# Patient Record
Sex: Female | Born: 1935 | Race: White | Hispanic: No | Marital: Married | Smoking: Never smoker
Health system: Southern US, Community
[De-identification: ages and names within clinical notes are randomized; demographics above are authoritative.]

## PROBLEM LIST (undated history)

## (undated) DIAGNOSIS — I10 Essential (primary) hypertension: Secondary | ICD-10-CM

## (undated) DIAGNOSIS — I499 Cardiac arrhythmia, unspecified: Secondary | ICD-10-CM

## (undated) DIAGNOSIS — I509 Heart failure, unspecified: Secondary | ICD-10-CM

## (undated) DIAGNOSIS — E119 Type 2 diabetes mellitus without complications: Secondary | ICD-10-CM

## (undated) DIAGNOSIS — I272 Pulmonary hypertension, unspecified: Secondary | ICD-10-CM

## (undated) DIAGNOSIS — C50919 Malignant neoplasm of unspecified site of unspecified female breast: Secondary | ICD-10-CM

## (undated) DIAGNOSIS — N189 Chronic kidney disease, unspecified: Secondary | ICD-10-CM

## (undated) DIAGNOSIS — E785 Hyperlipidemia, unspecified: Secondary | ICD-10-CM

## (undated) DIAGNOSIS — R000 Tachycardia, unspecified: Secondary | ICD-10-CM

## (undated) HISTORY — DX: Pulmonary hypertension, unspecified: I27.20

## (undated) HISTORY — DX: Cardiac arrhythmia, unspecified: I49.9

## (undated) HISTORY — DX: Heart failure, unspecified: I50.9

## (undated) HISTORY — DX: Hyperlipidemia, unspecified: E78.5

## (undated) HISTORY — DX: Type 2 diabetes mellitus without complications: E11.9

---

## 1992-05-01 DIAGNOSIS — C50919 Malignant neoplasm of unspecified site of unspecified female breast: Secondary | ICD-10-CM

## 1992-05-01 HISTORY — PX: MASTECTOMY: SHX3

## 1992-05-01 HISTORY — DX: Malignant neoplasm of unspecified site of unspecified female breast: C50.919

## 2005-04-13 ENCOUNTER — Ambulatory Visit: Payer: Self-pay | Admitting: Family Medicine

## 2006-05-21 ENCOUNTER — Ambulatory Visit: Payer: Self-pay | Admitting: Family Medicine

## 2006-09-17 ENCOUNTER — Ambulatory Visit: Payer: Self-pay | Admitting: Specialist

## 2006-09-17 ENCOUNTER — Other Ambulatory Visit: Payer: Self-pay

## 2006-10-03 ENCOUNTER — Inpatient Hospital Stay: Payer: Self-pay | Admitting: Specialist

## 2007-01-09 ENCOUNTER — Ambulatory Visit: Payer: Self-pay | Admitting: Family Medicine

## 2007-01-19 ENCOUNTER — Ambulatory Visit: Payer: Self-pay | Admitting: Family Medicine

## 2007-02-21 ENCOUNTER — Ambulatory Visit: Payer: Self-pay | Admitting: Specialist

## 2007-03-06 ENCOUNTER — Inpatient Hospital Stay: Payer: Self-pay | Admitting: Specialist

## 2007-07-30 ENCOUNTER — Ambulatory Visit: Payer: Self-pay | Admitting: Family Medicine

## 2007-12-11 IMAGING — CR DG KNEE 1-2V*L*
1 series · 2 of 2 positions shown · non-contrast
Comparison: none

REASON FOR EXAM: Post-op
COMMENTS:   Bedside (portable):Y

PROCEDURE:     DXR - DXR KNEE LEFT AP AND LATERAL  - March 06, 2007 [DATE]
RESULT:     Images of the LEFT knee are obtained following LEFT knee
arthroplasty. Surgical drains and skin staples are present. There is no
evidence of an immediate post-operative bone or hardware complication.

[Series 1: view not recorded · 0.17mm/px · 2 of 2 slices shown]
[im 1/2]
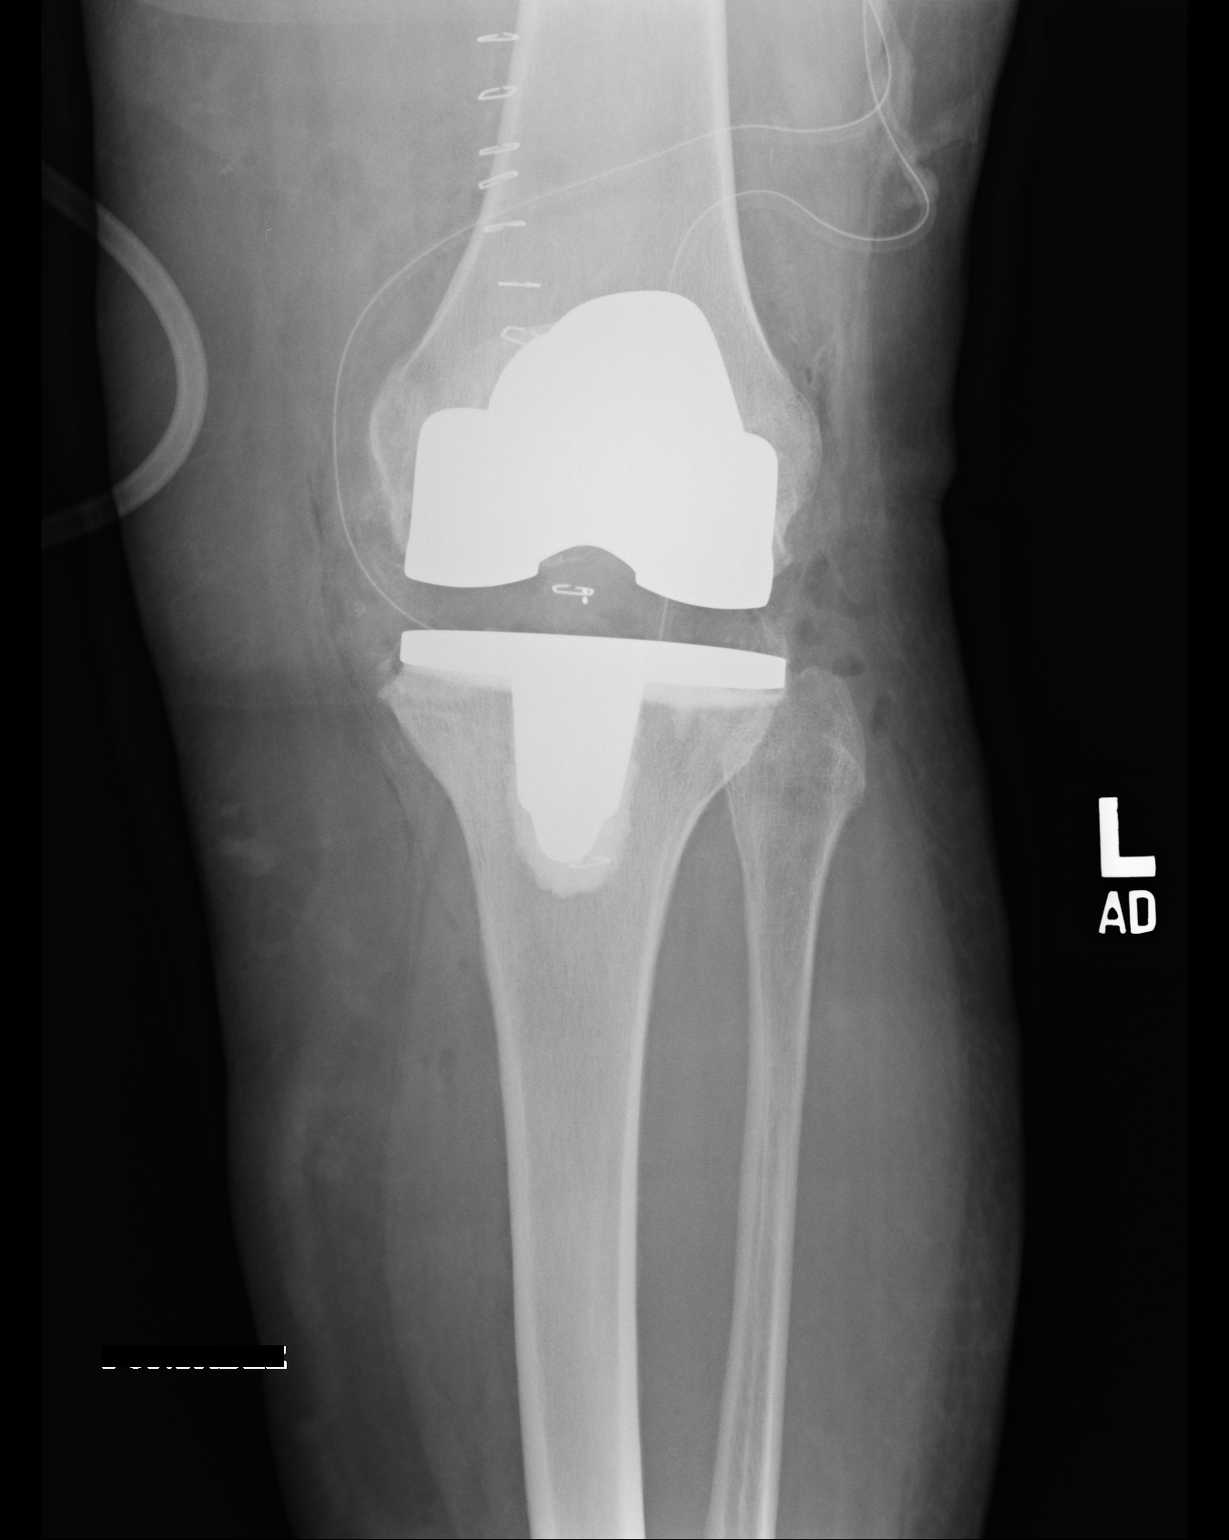
[im 2/2]
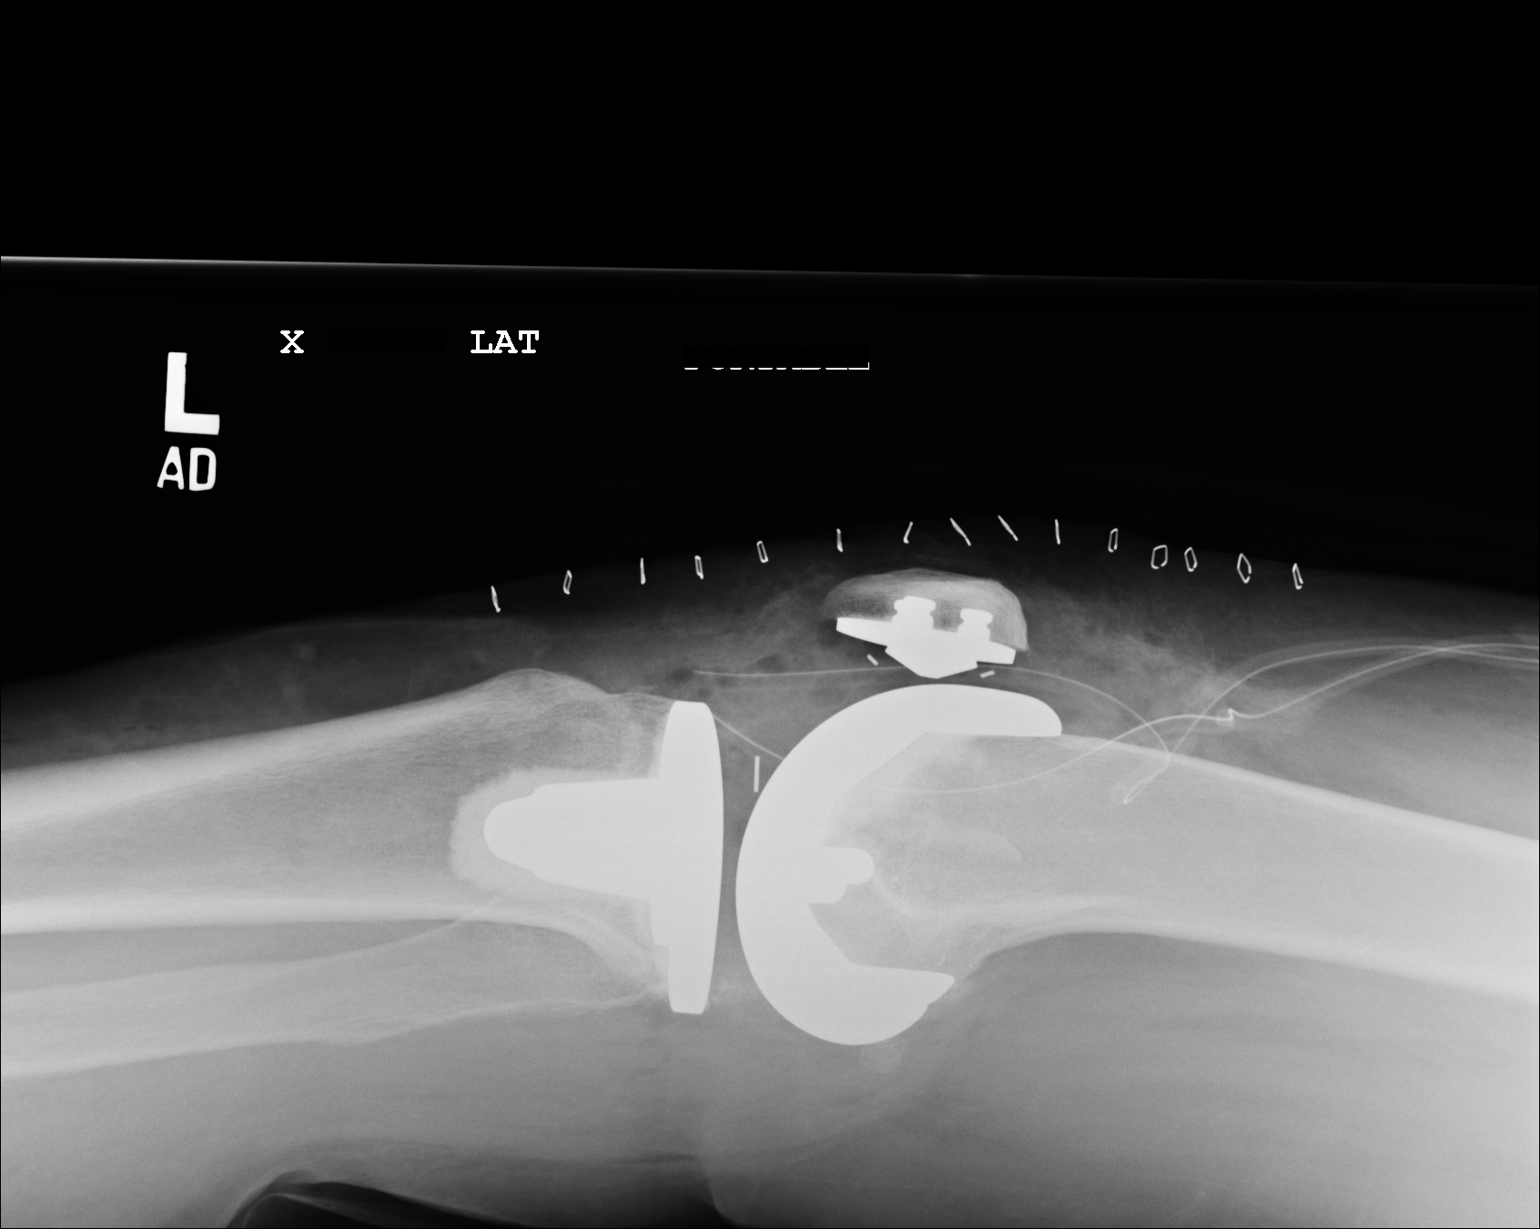

[2 of 2 positions shown; findings below may reference images not displayed]

IMPRESSION: Please see above.

## 2008-08-06 ENCOUNTER — Ambulatory Visit: Payer: Self-pay | Admitting: Family Medicine

## 2009-08-26 ENCOUNTER — Ambulatory Visit: Payer: Self-pay | Admitting: Family Medicine

## 2010-09-14 ENCOUNTER — Ambulatory Visit: Payer: Self-pay | Admitting: Family Medicine

## 2011-10-25 ENCOUNTER — Ambulatory Visit: Payer: Self-pay | Admitting: Family Medicine

## 2012-01-06 ENCOUNTER — Ambulatory Visit: Payer: Self-pay

## 2012-06-02 ENCOUNTER — Emergency Department: Payer: Self-pay | Admitting: Emergency Medicine

## 2012-06-02 LAB — CBC
HCT: 37.7 % (ref 35.0–47.0)
HGB: 12.3 g/dL (ref 12.0–16.0)
MCH: 28.5 pg (ref 26.0–34.0)
RDW: 15 % — ABNORMAL HIGH (ref 11.5–14.5)
WBC: 6.6 10*3/uL (ref 3.6–11.0)

## 2012-06-02 LAB — COMPREHENSIVE METABOLIC PANEL
Albumin: 3.9 g/dL (ref 3.4–5.0)
Anion Gap: 9 (ref 7–16)
BUN: 24 mg/dL — ABNORMAL HIGH (ref 7–18)
Calcium, Total: 9.2 mg/dL (ref 8.5–10.1)
Co2: 27 mmol/L (ref 21–32)
EGFR (Non-African Amer.): 40 — ABNORMAL LOW
Glucose: 92 mg/dL (ref 65–99)
Sodium: 143 mmol/L (ref 136–145)
Total Protein: 7.4 g/dL (ref 6.4–8.2)

## 2012-06-02 LAB — CK TOTAL AND CKMB (NOT AT ARMC): CK-MB: 0.7 ng/mL (ref 0.5–3.6)

## 2013-02-27 ENCOUNTER — Ambulatory Visit: Payer: Self-pay | Admitting: Family Medicine

## 2013-05-02 ENCOUNTER — Ambulatory Visit: Payer: Self-pay | Admitting: Family Medicine

## 2014-02-06 IMAGING — US US EXTREM LOW VENOUS*L*
1 series · 14 of 24 positions shown · non-contrast
Comparison: None.

CLINICAL DATA: Left leg pain, swelling.

EXAM:
LEFT LOWER EXTREMITY VENOUS DOPPLER ULTRASOUND
TECHNIQUE: Gray-scale sonography with graded compression, as well as color
Doppler and duplex ultrasound, were performed to evaluate the deep
venous system from the level of the common femoral vein through the
popliteal and proximal calf veins. Spectral Doppler was utilized to
evaluate flow at rest and with distal augmentation maneuvers.

[Series 1: us extrem low venous*left* · 0.10mm/px · 14 of 32 slices shown]
[im 1/32]
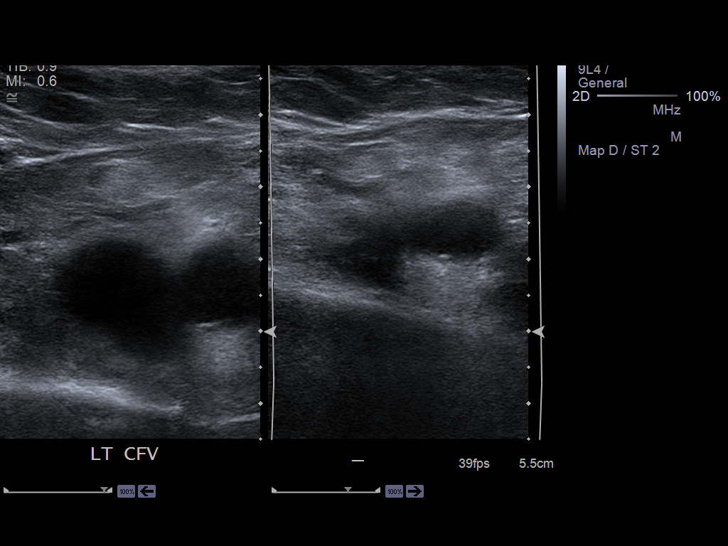
[im 3/32]
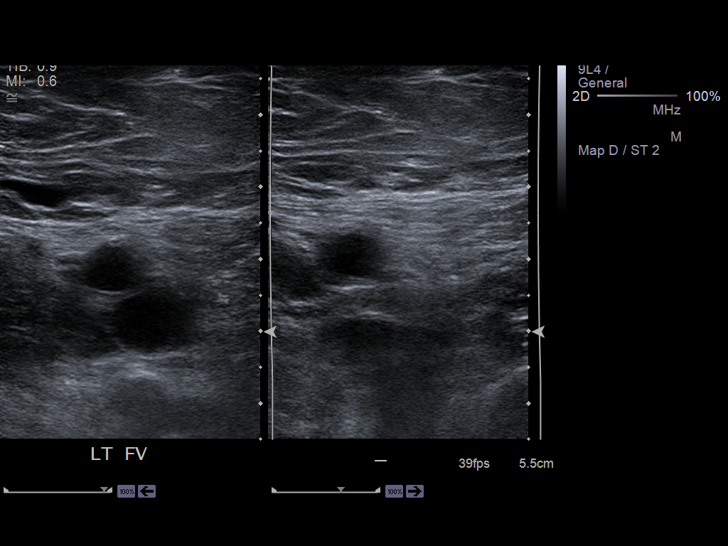
[im 6/32]
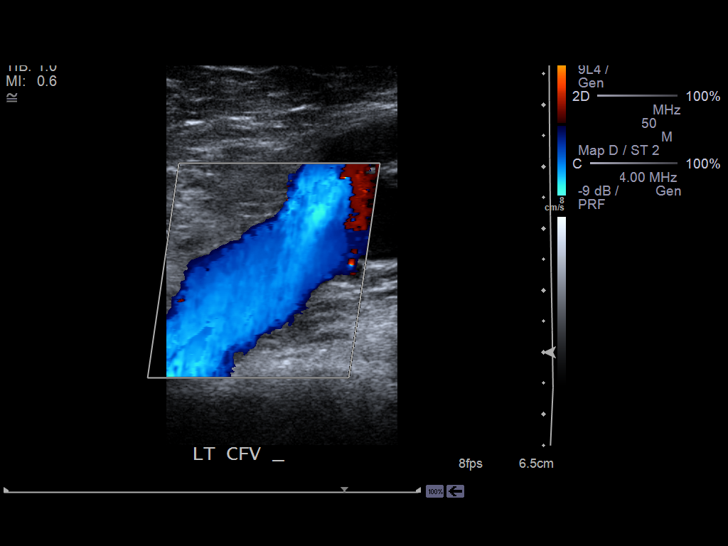
[im 9/32]
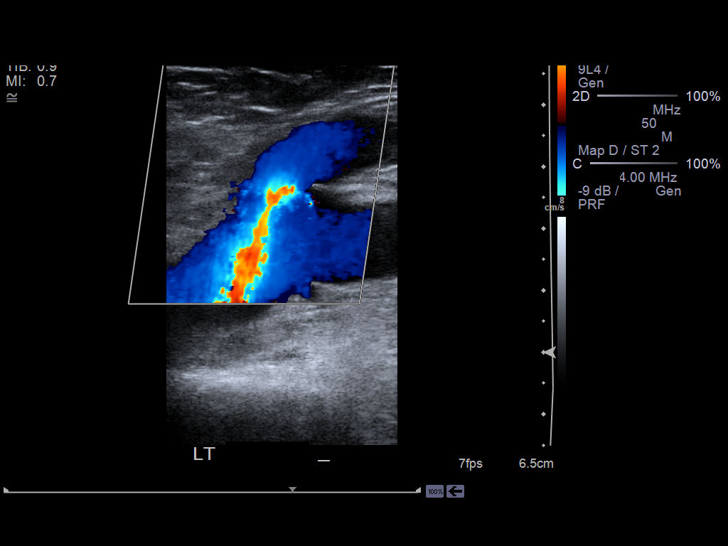
[im 10/32]
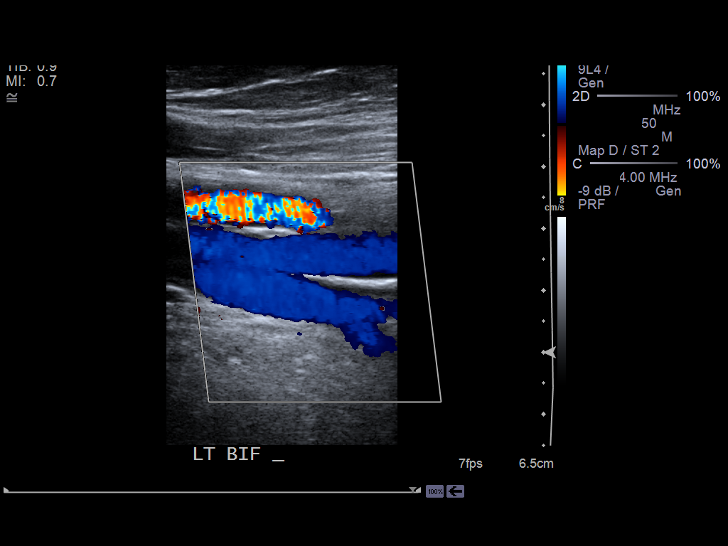
[im 13/32]
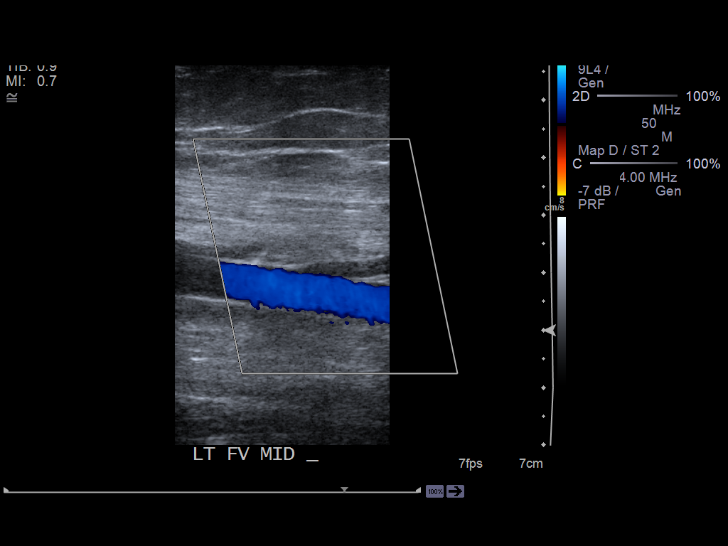
[im 15/32]
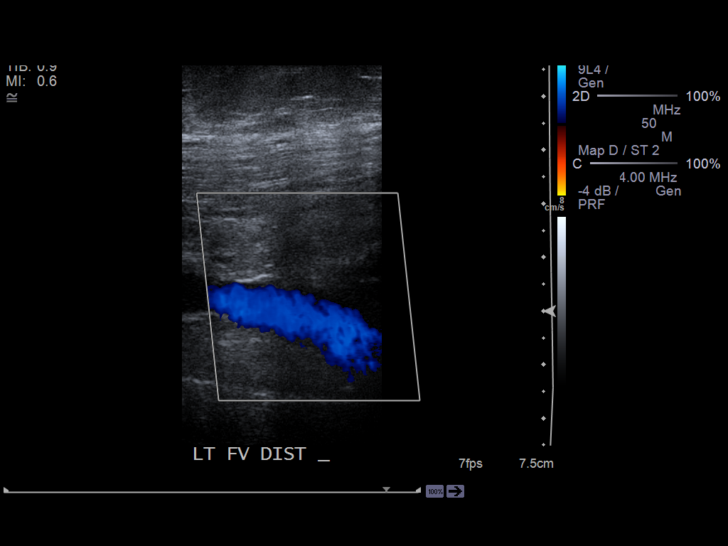
[im 17/32]
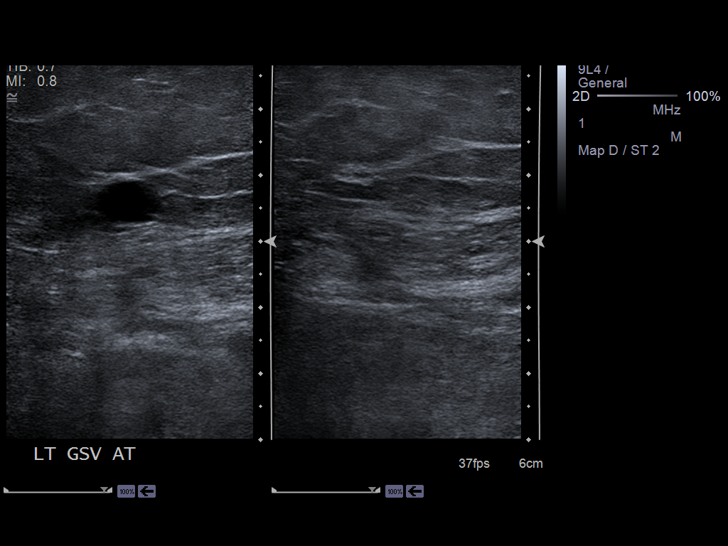
[im 19/32]
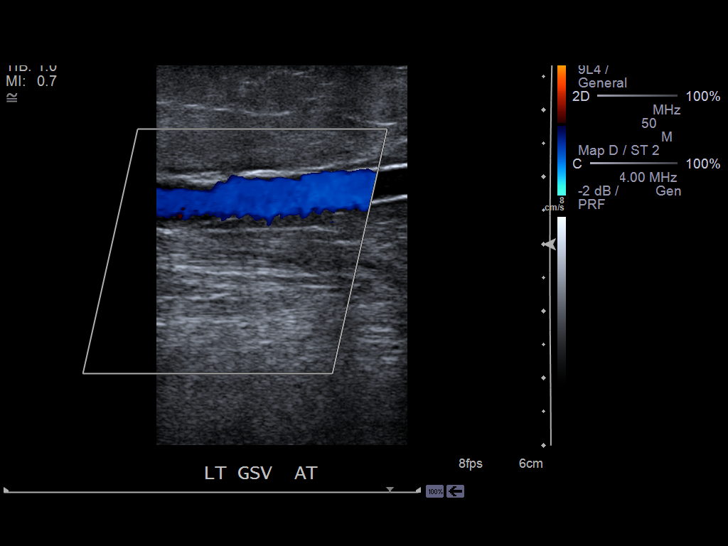
[im 22/32]
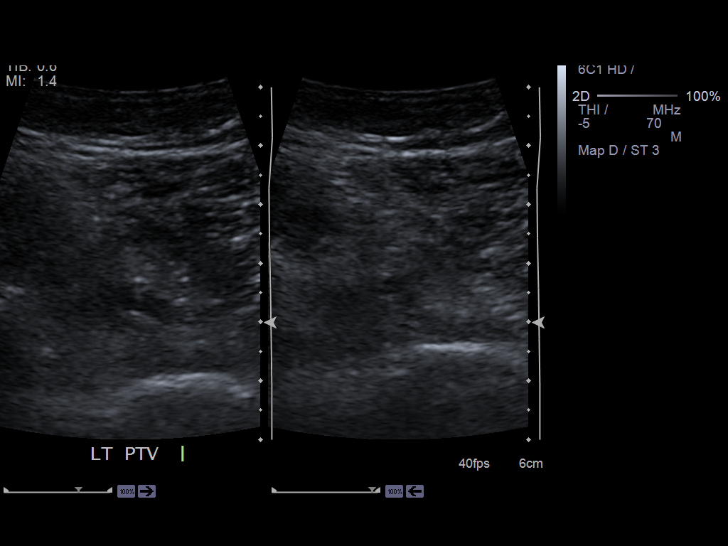
[im 25/32]
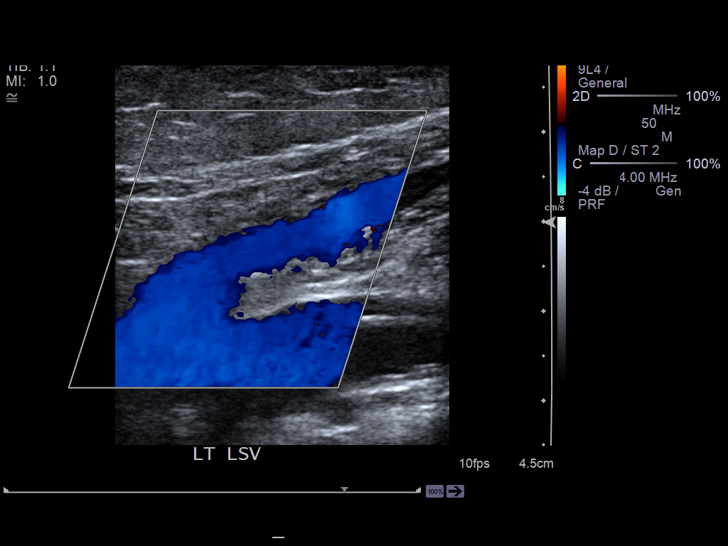
[im 26/32]
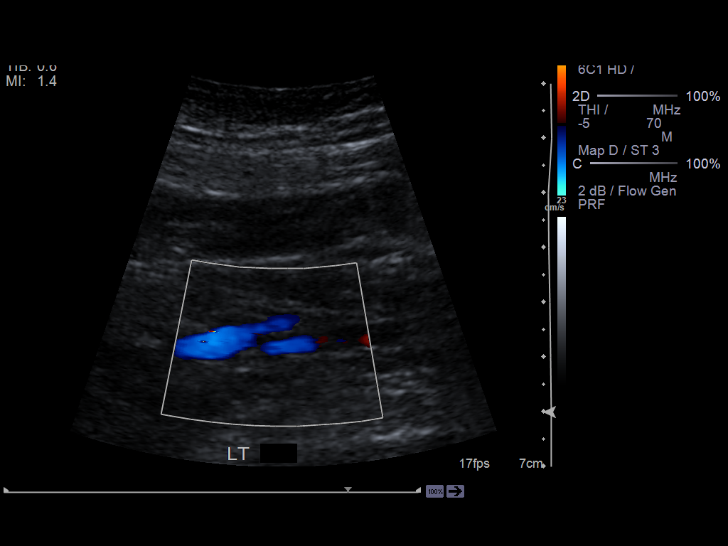
[im 29/32]
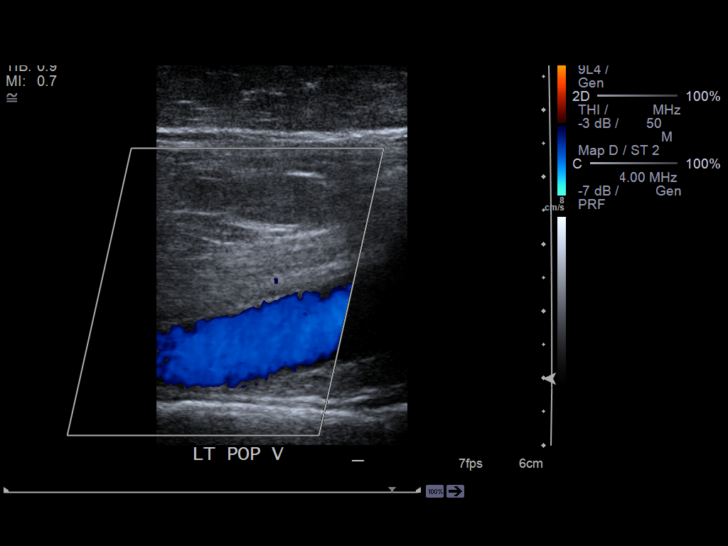
[im 32/32]
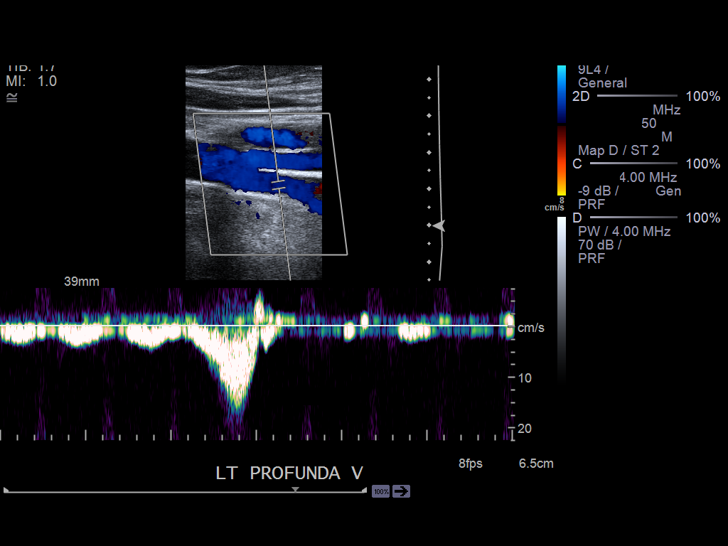

[14 of 24 positions shown; findings below may reference images not displayed]

FINDINGS: Thrombus within deep veins:  None visualized.

Compressibility of deep veins:  Normal.

Duplex waveform respiratory phasicity:  Normal.

Duplex waveform response to augmentation:  Normal.

Venous reflux:  None visualized.

Other findings:  None visualized.
IMPRESSION: No evidence of left lower extremity DVT.

## 2014-03-14 ENCOUNTER — Emergency Department: Payer: Self-pay | Admitting: Emergency Medicine

## 2014-07-12 ENCOUNTER — Observation Stay: Payer: Self-pay | Admitting: Internal Medicine

## 2014-08-30 NOTE — H&P (Signed)
PATIENT NAME:  Destiny Oliver, Destiny Oliver MR#:  [id number] DATE OF BIRTH:  1926/08/01  DATE OF ADMISSION:  07/12/2014  PRIMARY CARE PHYSICIAN: Youlanda Roys. Lovie Macadamia, MD    PRESENTING COMPLAINT: "I passed out today."   HISTORY OF PRESENT ILLNESS: Destiny Oliver is a very pleasant 81 year old Caucasian female with past medical history of hyperlipidemia, hypertension, difficulty hearing, comes to the Emergency Room after she had a syncopal episode in her garage while she was trying to get into her car to go have lunch out with her husband. She passed out. She had an injury around her left forehead. She did not have any bleeding. Woke up and called her husband who brought her to the Emergency Room. In the Emergency Room, she was otherwise hemodynamically stable; however, her blood pressure remained on the higher side, which was systolic in the A999333. She received some IV hydralazine. Blood pressure came down to 152. The patient thereby is being admitted for overnight observation. She denies shortness of breath, chest pain, any dizziness, or nausea or vomiting.   PAST MEDICAL HISTORY:  1.  Knee surgery.  2.  Hyperlipidemia.  3.  Impaired hearing.  4.  Chronic anemia.  5.  Hypertension.  6.  Type 2 diabetes.  7.  History of breast cancer.  8.  CAD.  9.  Left and right total knee replacement.  10.  Thyroid nodule removal.  11.  Surgery for detached retina in her right eye.  12.  Bilateral cataract extraction.  13.  Total hysterectomy.  14.  Appendectomy.  15.  Right mastectomy.  16.  Partial hysterectomy.   ALLERGIES: CALCIUM CHANNEL BLOCKER, CEPHALOSPORINS, AND PENICILLIN.    MEDICATIONS:  1.  Verapamil 240 mg p.o. daily.  2.  Pravastatin 40 mg p.o. every other day.  3.  Metoprolol 200 mg 1/2 tablet b.i.d.  4.  Hydrochlorothiazide/lisinopril 12.5/20 mg 1 tablet b.i.d.  5.  Hydralazine 50 mg p.o. t.i.d.  6.  Aspirin 81 mg daily.   REVIEW OF SYSTEMS:  CONSTITUTIONAL: No fever. Positive for some fatigue and  weakness.  EYES: No blurred or double vision.  ENT: No tinnitus or ear pain. Positive impaired hearing.  RESPIRATORY: No cough, wheeze, hemoptysis, or dyspnea.  CARDIOVASCULAR: No chest pain, orthopnea, or edema. Positive for hypertension.  GASTROINTESTINAL: No nausea, vomiting, diarrhea, or abdominal pain.  GENITOURINARY: No dysuria, hematuria, or frequency.  ENDOCRINE: No polyuria, nocturia, or thyroid problems.  HEMATOLOGY: Positive for chronic anemia. No easy bruising or bleeding.  SKIN: No acne, rash, or lesions.  MUSCULOSKELETAL: No arthritis, swelling, or gout.  NEUROLOGIC: No CVA, TIA, dementia, or headache.  PSYCHIATRIC: No anxiety, depression, or bipolar disorder.   All other systems reviewed are negative.   PHYSICAL EXAMINATION:  GENERAL: The patient is awake, alert, oriented x 3, not in acute distress.  VITAL SIGNS: She is afebrile, pulse is 72, blood pressure is 152/71, saturations are 94% on room air.  HEENT: Atraumatic, normocephalic. Pupils: PERRLA. EOM intact. Oral mucosa is moist.  NECK: Supple. No JVD. No carotid bruit.  RESPIRATORY: Clear to auscultation bilaterally. No rales, rhonchi, respiratory distress, or labored breathing.  CARDIOVASCULAR: Both heart sounds are normal. Rate, rhythm regular. PMI not lateralized. Chest is nontender.  EXTREMITIES: Good pedal pulses, good femoral pulses. No lower extremity edema.  ABDOMEN: Obese, soft, nontender. No organomegaly. Positive bowel sounds.  NEUROLOGIC: Grossly intact cranial nerves II through XII. No motor or sensory deficit.  PSYCHIATRIC: The patient is awake, alert, oriented x 3.  SKIN: Warm  and dry.   Chest x-ray shows airspace opacity in left lower lobe could reflect atelectasis. Mild airway  thickening suggestive of bronchitis.   LABORATORY DATA: CBC within normal limits. The glucose is 110, BUN 31, creatinine 1.3. The rest of the electrolytes normal. UA negative for UTI.   CT of the head shows minimal soft  tissue stranding about the left side of the forehead without associated radiopaque foreign body, displaced calvarial fracture or acute intracranial process.  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   EKG shows sinus rhythm with occasional PVCs.   ASSESSMENT AND PLAN: An 81 year old, Destiny Oliver, with history of hypertension, type 2 diabetes, impaired hearing, comes in with:  1.  Syncopal episode, etiology unclear, but could be due to elevated blood pressure. The patient's blood pressure since being in the Emergency Room has been higher, around [id number] to A999333 systolic. She received intravenous hydralazine 10 mg in the Emergency Room. We will resume her blood pressure medications and monitor blood pressure closely. She will receive some intravenous fluids. Her EKG shows sinus rhythm, but we will observe her overnight on telemetry monitor and cycle cardiac enzymes x 3. The patient is asymptomatic at this time.  2.  Type 2 diabetes. The patient will continue on sliding scale. She is not on any medications at home.  3.  Uncontrolled hypertension. We will resume her hydrochlorothiazide/lisinopril, metoprolol, verapamil, and hydralazine. 4.  Hyperlipidemia. The patient is on pravastatin.  5.  Chronic anemia. Hemoglobin stable.  6.  Deep vein thrombosis prophylaxis. Subcutaneous heparin t.i.d.   Above was discussed with the patient and the patient's husband who was present in the Emergency Room.  TIME SPENT: 50 minutes.    ____________________________ Hart Rochester Posey Pronto, MD sap:ts D: 07/12/2014 19:18:50 ET T: 07/12/2014 22:03:49 ET JOB#: [id number]  cc: Tishana Clinkenbeard A. Posey Pronto, MD, <Dictator> Youlanda Roys. Lovie Macadamia, MD Ilda Basset MD ELECTRONICALLY SIGNED 08/03/2014 12:19

## 2014-08-30 NOTE — Discharge Summary (Signed)
PATIENT NAME:  Destiny Oliver, Destiny Oliver MR#:  [id number] DATE OF BIRTH:  Jul 20, 1926  DATE OF ADMISSION:  07/12/2014 DATE OF DISCHARGE:  07/13/2014  DISCHARGE DIAGNOSES: Syncope.   PROCEDURES: None.   CONSULTATIONS: None.   CODE STATUS: Full code.   DISCHARGE MEDICATIONS: Verapamil 240 mg 1 capsule p.o. once daily, hydrochlorothiazide/lisinopril 12.5/20 one tablet p.o. 2 times a day, hydralazine 50 mg p.o. 3 times a day, metoprolol succinate 200 mg 1/2 tablet p.o. 2 times a day, pravastatin 40 mg 1 tablet p.o. every other day, Colace 100 mg 1 capsule p.o. 2 times a day as needed for constipation.   DIET: Low sodium, regular consistency.   ACTIVITY: As tolerated.   FOLLOWUP: Primary care physician in 1-2 days. Recommended the patient to monitor and keep blood pressure log. Check her blood pressure 2 times a day, and follow up with primary care physician with blood pressure log. Also, primary care physician to consider getting outpatient echocardiogram and carotid Dopplers in a week or so.   BRIEF HISTORY AND PHYSICAL AND HOSPITAL COURSE: The patient is an 81 year old pleasant Caucasian female with a past medical history of hypertension and hyperlipidemia who came to the Emergency Room after she had a syncopal episode in her garage while trying to get into the car to get lunch. Basically, the patient passed out when getting into the car and she injured her left side of the forehead. Did not have any bleeding. The patient is brought into the ED. Her systolic blood pressure was elevated at 180. She was given IV hydralazine. Following which blood pressure came down to 152. Please see history and physical for details. Denies any shortness of breath or dizziness at the time of admission. The patient was admitted to the hospitalist service for syncopal episode.   HOSPITAL COURSE BASED ON THE PROBLEM:  1.  Syncope, etiology is not quite clear, but it could be from elevated blood pressure or hypoglycemia, as the  patient did not get to eat that day. Admitted to the hospitalist service under observation status. IV hydralazine was given in the ED. EKG showed normal sinus rhythm. Cardiac enzymes were recycled x 3, which were normal. Acute myocardial infarction was ruled out. CT head was done, which did not reveal any acute infarcts. CT head just showed soft tissue stranding. Chest x-ray has revealed questionable airspace opacity. By the next day, her blood pressure is well controlled. The patient is asymptomatic, afebrile, and feels absolutely normal and she was to be discharged home. On March 14, the patient's blood pressure was at 119/68 following blood pressure medication administration. The patient was feeling back to normal and requested to discharge her home. Echocardiogram and carotid Dopplers were advised, but the patient wants to get those studies as an outpatient. White count was normal. Urinalysis was also normal. The decision was made to discharge the patient home in stable condition.  2.  Hypertension. Resumed her home medication hydrochlorothiazide with lisinopril, metoprolol, verapamil, and hydralazine. Following blood pressure medications, blood pressure being normal, I did not make any new changes in her medications.  3.  Hyperlipidemia. Continue statin.  4.  Chronic anemia. Hemoglobin is stable.  The patient denies any chest pain, shortness of breath, cough, fever, or dizziness. Denies any other complaints.   PHYSICAL EXAMINATION:  VITAL SIGNS: Temperature 97.6, pulse 67, respirations 20, blood pressure 119/68, pulse oximetry 94% on room air.   GENERAL APPEARANCE: Not in acute distress, alert, oriented, and awake.  HEENT: Normocephalic. Small forehead laceration  covered with a clean bandage. Pupils are equal, reacting to light and accommodation. No scleral icterus. No conjunctival injection. No sinus tenderness.  NECK: Supple. No JVD. No thyromegaly. Range of motion is intact. LUNGS: Clear to  auscultation bilaterally. No accessory muscle use. No anterior chest wall tenderness on palpation.  CARDIOVASCULAR: S1, S2 normal. Regular rate and rhythm. No murmurs.  GASTROINTESTINAL: Soft. Bowel sounds are positive in all 4 quadrants. Nontender, nondistended. No masses.   NEUROLOGIC: Alert, awake, oriented x 3. Cranial nerves II-XII grossly intact. Motor and sensory intact. Reflexes are 2+. EXTREMITIES: No edema. No cyanosis. No clubbing.  SKIN: Warm to touch. Normal turgor. No rashes. No lesions.  MUSCULOSKELETAL: No neck pain, no shoulder pain  LABORATORY DATA: Troponin less than 0.3 x 3. CK-MB in the normal range. Glucose 110, BUN 31, creatinine 1.30, sodium, potassium, chloride, and CO2 are normal. . GFR is 37. CBC normal. Urinalysis: Straw-colored, clear in appearance, nitrites and leukocyte esterase are normal.   EKG: Normal sinus rhythm . Normal PR and QT intervals.   CAT scan of the head without contrast: Minimal soft tissue stranding about the left side of the forehead without associated radiopaque foreign body. Displaced calvarial fracture or acute intracranial process. Similar findings of likely age appropriate atrophy. 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  no air fluid level. Chest x-ray, portable, airspace opacity in the left lower lobe could reflect atelectasis or aspiration, mild to moderate enlargement of the pericardial silhouette  and bronchitis or reactive airway disease.   TOTAL TIME SPENT ON THE DISCHARGE: 40 minutes.    ____________________________ Nicholes Mango, MD ag:bm D: 07/14/2014 20:12:26 ET T: 07/15/2014 03:08:16 ET JOB#: [id number]  cc: Nicholes Mango, MD, <Dictator> Primary Care Physician  Nicholes Mango MD ELECTRONICALLY SIGNED 07/28/2014 16:46

## 2014-11-12 ENCOUNTER — Other Ambulatory Visit: Payer: Self-pay | Admitting: Adult Health

## 2014-11-12 ENCOUNTER — Ambulatory Visit
Admission: RE | Admit: 2014-11-12 | Discharge: 2014-11-12 | Disposition: A | Discharge status: Home / Self Care | Payer: Medicare Other | Source: Ambulatory Visit | Attending: Family Medicine | Admitting: Family Medicine

## 2014-11-12 DIAGNOSIS — M19042 Primary osteoarthritis, left hand: Secondary | ICD-10-CM | POA: Insufficient documentation

## 2014-11-12 DIAGNOSIS — M79645 Pain in left finger(s): Secondary | ICD-10-CM | POA: Diagnosis present

## 2014-11-12 DIAGNOSIS — L03012 Cellulitis of left finger: Secondary | ICD-10-CM

## 2014-11-12 DIAGNOSIS — B999 Unspecified infectious disease: Secondary | ICD-10-CM

## 2015-06-16 ENCOUNTER — Other Ambulatory Visit: Payer: Self-pay | Admitting: Family Medicine

## 2015-06-16 DIAGNOSIS — Z1231 Encounter for screening mammogram for malignant neoplasm of breast: Secondary | ICD-10-CM

## 2015-06-24 ENCOUNTER — Other Ambulatory Visit: Payer: Self-pay | Admitting: Family Medicine

## 2015-06-24 ENCOUNTER — Ambulatory Visit
Admission: RE | Admit: 2015-06-24 | Discharge: 2015-06-24 | Disposition: A | Discharge status: Home / Self Care | Payer: Medicare Other | Source: Ambulatory Visit | Attending: Family Medicine | Admitting: Family Medicine

## 2015-06-24 DIAGNOSIS — Z1231 Encounter for screening mammogram for malignant neoplasm of breast: Secondary | ICD-10-CM

## 2015-06-24 DIAGNOSIS — Z9011 Acquired absence of right breast and nipple: Secondary | ICD-10-CM | POA: Insufficient documentation

## 2015-06-24 HISTORY — DX: Malignant neoplasm of unspecified site of unspecified female breast: C50.919

## 2017-12-27 ENCOUNTER — Emergency Department: Payer: Medicare Other

## 2017-12-27 ENCOUNTER — Emergency Department
Admission: EM | Admit: 2017-12-27 | Discharge: 2017-12-27 | Disposition: A | Discharge status: Home / Self Care | Payer: Medicare Other | Attending: Emergency Medicine | Admitting: Emergency Medicine

## 2017-12-27 ENCOUNTER — Other Ambulatory Visit: Payer: Self-pay

## 2017-12-27 ENCOUNTER — Encounter: Payer: Self-pay | Admitting: *Deleted

## 2017-12-27 DIAGNOSIS — R1011 Right upper quadrant pain: Secondary | ICD-10-CM | POA: Insufficient documentation

## 2017-12-27 DIAGNOSIS — Z853 Personal history of malignant neoplasm of breast: Secondary | ICD-10-CM | POA: Insufficient documentation

## 2017-12-27 LAB — CBC
HEMATOCRIT: 39.3 % (ref 35.0–47.0)
Hemoglobin: 13.1 g/dL (ref 12.0–16.0)
MCH: 29.6 pg (ref 26.0–34.0)
MCHC: 33.4 g/dL (ref 32.0–36.0)
MCV: 88.4 fL (ref 80.0–100.0)
PLATELETS: 208 10*3/uL (ref 150–440)
RBC: 4.45 MIL/uL (ref 3.80–5.20)
RDW: 14.5 % (ref 11.5–14.5)
WBC: 6.4 10*3/uL (ref 3.6–11.0)

## 2017-12-27 LAB — URINALYSIS, COMPLETE (UACMP) WITH MICROSCOPIC
BILIRUBIN URINE: NEGATIVE
Glucose, UA: NEGATIVE mg/dL
Hgb urine dipstick: NEGATIVE
KETONES UR: NEGATIVE mg/dL
Leukocytes, UA: NEGATIVE
Nitrite: NEGATIVE
PH: 5 (ref 5.0–8.0)
Protein, ur: NEGATIVE mg/dL
Specific Gravity, Urine: 1.017 (ref 1.005–1.030)

## 2017-12-27 LAB — COMPREHENSIVE METABOLIC PANEL
ALT: 17 U/L (ref 0–44)
ANION GAP: 7 (ref 5–15)
AST: 23 U/L (ref 15–41)
Albumin: 4.2 g/dL (ref 3.5–5.0)
Alkaline Phosphatase: 79 U/L (ref 38–126)
BILIRUBIN TOTAL: 0.8 mg/dL (ref 0.3–1.2)
BUN: 40 mg/dL — AB (ref 8–23)
CO2: 25 mmol/L (ref 22–32)
Calcium: 9.6 mg/dL (ref 8.9–10.3)
Chloride: 108 mmol/L (ref 98–111)
Creatinine, Ser: 1.73 mg/dL — ABNORMAL HIGH (ref 0.44–1.00)
GFR, EST AFRICAN AMERICAN: 29 mL/min — AB (ref >60)
GFR, EST NON AFRICAN AMERICAN: 25 mL/min — AB (ref >60)
Glucose, Bld: 157 mg/dL — ABNORMAL HIGH (ref 70–99)
POTASSIUM: 4 mmol/L (ref 3.5–5.1)
Sodium: 140 mmol/L (ref 135–145)
TOTAL PROTEIN: 6.9 g/dL (ref 6.5–8.1)

## 2017-12-27 LAB — LIPASE, BLOOD: LIPASE: 43 U/L (ref 11–51)

## 2017-12-27 MED ORDER — FOSFOMYCIN TROMETHAMINE 3 G PO PACK
3.0000 g | PACK | Freq: Once | ORAL | Status: AC
  Administered 2017-12-27: 3 g via ORAL
  Filled 2017-12-27 (×2): qty 3

## 2017-12-27 NOTE — ED Provider Notes (Signed)
[facility] Emergency Department Provider Note  Time seen: 6:35 PM  I have reviewed the triage vital signs and the nursing notes.   HISTORY  Chief Complaint Abdominal Pain    HPI Destiny Oliver is a 83 y.o. female with a past medical history of anemia, diabetes, gastric reflux, hypertension, hyperlipidemia, presents to the emergency department for right upper quadrant abdominal pain.  According to the patient since around 8 AM this morning she has been experiencing a mild to moderate dull pain in her right upper quadrant.  Denies any nausea vomiting or diarrhea.  Denies any fever chest pain or shortness of breath.  No dysuria.  Largely negative review of systems.  Patient still has her gallbladder.  Past Medical History:  Diagnosis Date  . Breast cancer ([place]) 1994   right breast    There are no active problems to display for this patient.   Past Surgical History:  Procedure Laterality Date  . MASTECTOMY Right 1994    Prior to Admission medications   Not on File    Allergies  Allergen Reactions  . Penicillins Swelling    History reviewed. No pertinent family history.  Social History Social History   Tobacco Use  . Smoking status: Never Smoker  . Smokeless tobacco: Never Used  Substance Use Topics  . Alcohol use: Never    Frequency: Never  . Drug use: Never    Review of Systems Constitutional: Negative for fever. Cardiovascular: Negative for chest pain. Respiratory: Negative for shortness of breath. Gastrointestinal: Right upper quadrant abdominal pain.  Negative for nausea vomiting or diarrhea Genitourinary: Negative for urinary compaints Musculoskeletal: Negative for musculoskeletal complaints Skin: Negative for skin complaints  Neurological: Negative for headache All other ROS negative  ____________________________________________   PHYSICAL EXAM:  VITAL SIGNS: ED Triage Vitals  Enc Vitals Group     BP 12/27/17 1717 (!)  156/73     Pulse Rate 12/27/17 1717 64     Resp 12/27/17 1717 16     Temp 12/27/17 1717 98.4 F (36.9 C)     Temp Source 12/27/17 1717 Oral     SpO2 12/27/17 1717 95 %     Weight 12/27/17 1717 190 lb (86.2 kg)     Height 12/27/17 1717 5\' 1"  (1.549 m)     Head Circumference --      Peak Flow --      Pain Score 12/27/17 1716 8     Pain Loc --      Pain Edu? --      Excl. in [place]? --    Constitutional: Alert and oriented. Well appearing and in no distress. Eyes: Normal exam ENT   Head: Normocephalic and atraumatic.   Mouth/Throat: Mucous membranes are moist. Cardiovascular: Normal rate, regular rhythm.  Respiratory: Normal respiratory effort without tachypnea nor retractions. Breath sounds are clear a Gastrointestinal: Soft, mild right upper quadrant tenderness, no rebound guarding or distention.  Abdomen otherwise benign. Musculoskeletal: Nontender with normal range of motion in all extremities.  Neurologic:  Normal speech and language. No gross focal neurologic deficits Skin:  Skin is warm, dry and intact.  Psychiatric: Mood and affect are normal. Speech and behavior are normal.   ____________________________________________    RADIOLOGY  Ultrasound shows gallstones but no cholecystitis.  ____________________________________________   INITIAL IMPRESSION / ASSESSMENT AND PLAN / ED COURSE  Pertinent labs & imaging results that were available during my care of the patient were reviewed by me and considered in my  medical decision making (see chart for details).  Patient presents to the emergency department for right upper quadrant tenderness since 8 AM this morning.  She states it is largely resolved at this time it only hurts when you push on it.  Patient was seen by her doctor and referred to the emergency department for evaluation.  Differential would include cholecystitis, biliary colic, hepatic steatosis, pancreatitis, colitis or diverticulitis, gastric or peptic ulcer  disease.  We will check labs, obtain a right upper quadrant ultrasound to further evaluate.  Patient agreeable plan of care.   Patient's labs reassuringly are normal including a normal white blood cell count and normal LFTs.  Patient does have renal insufficiency but this appears to be baseline for the patient.  Given the patient's right upper quadrant tenderness we will obtain a right upper quadrant ultrasound.  Ultrasound shows gallstones but no signs of cholecystitis.  Patient's urinalysis does show possible urinary tract infection.  We will cover with Keflex as a precaution.  We will have the patient follow-up with surgery if she continues to have any right upper quadrant discomfort.  I discussed return precautions for any significant discomfort nausea, vomiting or fever. ____________________________________________   FINAL CLINICAL IMPRESSION(S) / ED DIAGNOSES  Right upper quadrant pain    Harvest Dark, MD 12/27/17 [phone]

## 2017-12-27 NOTE — ED Triage Notes (Signed)
PT to ED reporting pain in the right side of her abd that is tender upon palpation. Pain started this morning. No NVD reported and no changes in urine. PT is afebrile.

## 2017-12-27 NOTE — Discharge Instructions (Addendum)
As we discussed please follow-up with your doctor in the next 2 to 3 days for recheck/reevaluation.  Return to the emergency department for any worsening abdominal pain development of nausea, vomiting or fever.

## 2017-12-29 LAB — URINE CULTURE

## 2018-04-18 ENCOUNTER — Other Ambulatory Visit: Payer: Self-pay

## 2018-04-18 ENCOUNTER — Encounter: Payer: Self-pay | Admitting: Emergency Medicine

## 2018-04-18 ENCOUNTER — Emergency Department: Payer: Medicare Other

## 2018-04-18 ENCOUNTER — Inpatient Hospital Stay
Admission: EM | Admit: 2018-04-18 | Discharge: 2018-04-30 | DRG: 557 | Disposition: A | Discharge status: Skilled Nursing Facility | Payer: Medicare Other | Attending: Internal Medicine | Admitting: Internal Medicine

## 2018-04-18 DIAGNOSIS — Z634 Disappearance and death of family member: Secondary | ICD-10-CM

## 2018-04-18 DIAGNOSIS — I4891 Unspecified atrial fibrillation: Secondary | ICD-10-CM

## 2018-04-18 DIAGNOSIS — E860 Dehydration: Secondary | ICD-10-CM

## 2018-04-18 DIAGNOSIS — Z66 Do not resuscitate: Secondary | ICD-10-CM | POA: Diagnosis not present

## 2018-04-18 DIAGNOSIS — S0083XA Contusion of other part of head, initial encounter: Secondary | ICD-10-CM | POA: Diagnosis present

## 2018-04-18 DIAGNOSIS — J45909 Unspecified asthma, uncomplicated: Secondary | ICD-10-CM | POA: Diagnosis present

## 2018-04-18 DIAGNOSIS — R0602 Shortness of breath: Secondary | ICD-10-CM

## 2018-04-18 DIAGNOSIS — R109 Unspecified abdominal pain: Secondary | ICD-10-CM

## 2018-04-18 DIAGNOSIS — D631 Anemia in chronic kidney disease: Secondary | ICD-10-CM | POA: Diagnosis present

## 2018-04-18 DIAGNOSIS — I480 Paroxysmal atrial fibrillation: Secondary | ICD-10-CM | POA: Diagnosis not present

## 2018-04-18 DIAGNOSIS — H269 Unspecified cataract: Secondary | ICD-10-CM | POA: Diagnosis present

## 2018-04-18 DIAGNOSIS — E782 Mixed hyperlipidemia: Secondary | ICD-10-CM | POA: Diagnosis present

## 2018-04-18 DIAGNOSIS — N184 Chronic kidney disease, stage 4 (severe): Secondary | ICD-10-CM | POA: Diagnosis present

## 2018-04-18 DIAGNOSIS — M47819 Spondylosis without myelopathy or radiculopathy, site unspecified: Secondary | ICD-10-CM | POA: Diagnosis present

## 2018-04-18 DIAGNOSIS — Z79899 Other long term (current) drug therapy: Secondary | ICD-10-CM

## 2018-04-18 DIAGNOSIS — M25551 Pain in right hip: Secondary | ICD-10-CM | POA: Diagnosis not present

## 2018-04-18 DIAGNOSIS — Z6841 Body Mass Index (BMI) 40.0 and over, adult: Secondary | ICD-10-CM | POA: Diagnosis not present

## 2018-04-18 DIAGNOSIS — Z7982 Long term (current) use of aspirin: Secondary | ICD-10-CM

## 2018-04-18 DIAGNOSIS — K219 Gastro-esophageal reflux disease without esophagitis: Secondary | ICD-10-CM | POA: Diagnosis present

## 2018-04-18 DIAGNOSIS — E1122 Type 2 diabetes mellitus with diabetic chronic kidney disease: Secondary | ICD-10-CM | POA: Diagnosis present

## 2018-04-18 DIAGNOSIS — I5033 Acute on chronic diastolic (congestive) heart failure: Secondary | ICD-10-CM | POA: Diagnosis present

## 2018-04-18 DIAGNOSIS — J189 Pneumonia, unspecified organism: Secondary | ICD-10-CM | POA: Diagnosis present

## 2018-04-18 DIAGNOSIS — I248 Other forms of acute ischemic heart disease: Secondary | ICD-10-CM | POA: Diagnosis present

## 2018-04-18 DIAGNOSIS — J9601 Acute respiratory failure with hypoxia: Secondary | ICD-10-CM | POA: Diagnosis present

## 2018-04-18 DIAGNOSIS — Z853 Personal history of malignant neoplasm of breast: Secondary | ICD-10-CM

## 2018-04-18 DIAGNOSIS — M6282 Rhabdomyolysis: Principal | ICD-10-CM

## 2018-04-18 DIAGNOSIS — Z7189 Other specified counseling: Secondary | ICD-10-CM | POA: Diagnosis not present

## 2018-04-18 DIAGNOSIS — I130 Hypertensive heart and chronic kidney disease with heart failure and stage 1 through stage 4 chronic kidney disease, or unspecified chronic kidney disease: Secondary | ICD-10-CM | POA: Diagnosis present

## 2018-04-18 DIAGNOSIS — N179 Acute kidney failure, unspecified: Secondary | ICD-10-CM | POA: Diagnosis present

## 2018-04-18 DIAGNOSIS — E876 Hypokalemia: Secondary | ICD-10-CM | POA: Diagnosis present

## 2018-04-18 DIAGNOSIS — Z515 Encounter for palliative care: Secondary | ICD-10-CM | POA: Diagnosis not present

## 2018-04-18 DIAGNOSIS — W19XXXA Unspecified fall, initial encounter: Secondary | ICD-10-CM

## 2018-04-18 DIAGNOSIS — Z751 Person awaiting admission to adequate facility elsewhere: Secondary | ICD-10-CM | POA: Diagnosis not present

## 2018-04-18 DIAGNOSIS — Z9011 Acquired absence of right breast and nipple: Secondary | ICD-10-CM

## 2018-04-18 DIAGNOSIS — G4700 Insomnia, unspecified: Secondary | ICD-10-CM | POA: Diagnosis not present

## 2018-04-18 DIAGNOSIS — Z880 Allergy status to penicillin: Secondary | ICD-10-CM

## 2018-04-18 DIAGNOSIS — W1830XA Fall on same level, unspecified, initial encounter: Secondary | ICD-10-CM | POA: Diagnosis present

## 2018-04-18 DIAGNOSIS — Z9181 History of falling: Secondary | ICD-10-CM

## 2018-04-18 HISTORY — DX: Tachycardia, unspecified: R00.0

## 2018-04-18 HISTORY — DX: Chronic kidney disease, unspecified: N18.9

## 2018-04-18 HISTORY — DX: Essential (primary) hypertension: I10

## 2018-04-18 LAB — PROTIME-INR
INR: 1.12
Prothrombin Time: 14.3 seconds (ref 11.4–15.2)

## 2018-04-18 LAB — CBC WITH DIFFERENTIAL/PLATELET
Abs Immature Granulocytes: 0.04 10*3/uL (ref 0.00–0.07)
BASOS ABS: 0 10*3/uL (ref 0.0–0.1)
BASOS PCT: 0 %
EOS PCT: 1 %
Eosinophils Absolute: 0.1 10*3/uL (ref 0.0–0.5)
HCT: 47.1 % — ABNORMAL HIGH (ref 36.0–46.0)
HEMOGLOBIN: 15.1 g/dL — AB (ref 12.0–15.0)
Immature Granulocytes: 0 %
LYMPHS PCT: 11 %
Lymphs Abs: 1.2 10*3/uL (ref 0.7–4.0)
MCH: 28.5 pg (ref 26.0–34.0)
MCHC: 32.1 g/dL (ref 30.0–36.0)
MCV: 88.9 fL (ref 80.0–100.0)
MONO ABS: 1 10*3/uL (ref 0.1–1.0)
Monocytes Relative: 8 %
NRBC: 0 % (ref 0.0–0.2)
Neutro Abs: 9.1 10*3/uL — ABNORMAL HIGH (ref 1.7–7.7)
Neutrophils Relative %: 80 %
PLATELETS: 280 10*3/uL (ref 150–400)
RBC: 5.3 MIL/uL — AB (ref 3.87–5.11)
RDW: 13.9 % (ref 11.5–15.5)
WBC: 11.4 10*3/uL — AB (ref 4.0–10.5)

## 2018-04-18 LAB — COMPREHENSIVE METABOLIC PANEL
ALT: 77 U/L — ABNORMAL HIGH (ref 0–44)
ANION GAP: 16 — AB (ref 5–15)
AST: 119 U/L — AB (ref 15–41)
Albumin: 4 g/dL (ref 3.5–5.0)
Alkaline Phosphatase: 75 U/L (ref 38–126)
BILIRUBIN TOTAL: 1.4 mg/dL — AB (ref 0.3–1.2)
BUN: 38 mg/dL — AB (ref 8–23)
CHLORIDE: 106 mmol/L (ref 98–111)
CO2: 20 mmol/L — ABNORMAL LOW (ref 22–32)
Calcium: 9.6 mg/dL (ref 8.9–10.3)
Creatinine, Ser: 1.21 mg/dL — ABNORMAL HIGH (ref 0.44–1.00)
GFR, EST AFRICAN AMERICAN: 46 mL/min — AB (ref >60)
GFR, EST NON AFRICAN AMERICAN: 40 mL/min — AB (ref >60)
Glucose, Bld: 129 mg/dL — ABNORMAL HIGH (ref 70–99)
POTASSIUM: 3.2 mmol/L — AB (ref 3.5–5.1)
Sodium: 142 mmol/L (ref 135–145)
TOTAL PROTEIN: 7.2 g/dL (ref 6.5–8.1)

## 2018-04-18 LAB — TSH: TSH: 1.173 u[IU]/mL (ref 0.350–4.500)

## 2018-04-18 LAB — TROPONIN I
TROPONIN I: 0.2 ng/mL — AB (ref <0.03)
Troponin I: 0.25 ng/mL (ref <0.03)

## 2018-04-18 LAB — APTT: APTT: 28 s (ref 24–36)

## 2018-04-18 LAB — HEMOGLOBIN: Hemoglobin: 13.5 g/dL (ref 12.0–15.0)

## 2018-04-18 LAB — MAGNESIUM: Magnesium: 2 mg/dL (ref 1.7–2.4)

## 2018-04-18 LAB — CK: CK TOTAL: 1767 U/L — AB (ref 38–234)

## 2018-04-18 MED ORDER — AMIODARONE HCL IN DEXTROSE 360-4.14 MG/200ML-% IV SOLN
30.0000 mg/h | INTRAVENOUS | Status: DC
  Administered 2018-04-19 – 2018-04-20 (×3): 30 mg/h via INTRAVENOUS
  Filled 2018-04-18 (×2): qty 200

## 2018-04-18 MED ORDER — ASPIRIN EC 81 MG PO TBEC
81.0000 mg | DELAYED_RELEASE_TABLET | Freq: Every day | ORAL | Status: DC
  Administered 2018-04-18 – 2018-04-30 (×13): 81 mg via ORAL
  Filled 2018-04-18 (×13): qty 1

## 2018-04-18 MED ORDER — SODIUM CHLORIDE 0.9 % IV BOLUS
250.0000 mL | Freq: Once | INTRAVENOUS | Status: AC
  Administered 2018-04-18: 250 mL via INTRAVENOUS

## 2018-04-18 MED ORDER — METOPROLOL SUCCINATE ER 100 MG PO TB24
100.0000 mg | ORAL_TABLET | Freq: Two times a day (BID) | ORAL | Status: DC
  Administered 2018-04-18 – 2018-04-23 (×11): 100 mg via ORAL
  Filled 2018-04-18: qty 1
  Filled 2018-04-18: qty 2
  Filled 2018-04-18: qty 1
  Filled 2018-04-18 (×2): qty 2
  Filled 2018-04-18 (×2): qty 1
  Filled 2018-04-18: qty 2
  Filled 2018-04-18: qty 1
  Filled 2018-04-18: qty 2
  Filled 2018-04-18 (×2): qty 1

## 2018-04-18 MED ORDER — DOCUSATE SODIUM 100 MG PO CAPS
100.0000 mg | ORAL_CAPSULE | Freq: Two times a day (BID) | ORAL | Status: DC
  Administered 2018-04-18 – 2018-04-30 (×23): 100 mg via ORAL
  Filled 2018-04-18 (×24): qty 1

## 2018-04-18 MED ORDER — ADULT MULTIVITAMIN W/MINERALS CH
1.0000 | ORAL_TABLET | Freq: Every day | ORAL | Status: DC
  Administered 2018-04-18 – 2018-04-29 (×12): 1 via ORAL
  Filled 2018-04-18 (×12): qty 1

## 2018-04-18 MED ORDER — DIGOXIN 0.25 MG/ML IJ SOLN
0.2500 mg | INTRAMUSCULAR | Status: AC
  Administered 2018-04-18: 0.25 mg via INTRAVENOUS
  Filled 2018-04-18: qty 2

## 2018-04-18 MED ORDER — DILTIAZEM HCL 25 MG/5ML IV SOLN
15.0000 mg | Freq: Once | INTRAVENOUS | Status: AC
  Administered 2018-04-18: 15 mg via INTRAVENOUS
  Filled 2018-04-18: qty 5

## 2018-04-18 MED ORDER — AMIODARONE HCL IN DEXTROSE 360-4.14 MG/200ML-% IV SOLN
60.0000 mg/h | INTRAVENOUS | Status: DC
  Administered 2018-04-18: 60 mg/h via INTRAVENOUS
  Filled 2018-04-18 (×2): qty 200

## 2018-04-18 MED ORDER — HEPARIN (PORCINE) 25000 UT/250ML-% IV SOLN
1100.0000 [IU]/h | INTRAVENOUS | Status: DC
  Administered 2018-04-18: 1000 [IU]/h via INTRAVENOUS
  Administered 2018-04-19: 1100 [IU]/h via INTRAVENOUS
  Filled 2018-04-18 (×2): qty 250

## 2018-04-18 MED ORDER — ACETAMINOPHEN 650 MG RE SUPP: 650.0000 mg | Freq: Four times a day (QID) | RECTAL | Status: DC | PRN

## 2018-04-18 MED ORDER — DILTIAZEM HCL-DEXTROSE 100-5 MG/100ML-% IV SOLN (PREMIX): 5.0000 mg/h | INTRAVENOUS | Status: DC

## 2018-04-18 MED ORDER — ONDANSETRON HCL 4 MG PO TABS: 4.0000 mg | ORAL_TABLET | Freq: Four times a day (QID) | ORAL | Status: DC | PRN

## 2018-04-18 MED ORDER — ONDANSETRON HCL 4 MG/2ML IJ SOLN
4.0000 mg | Freq: Four times a day (QID) | INTRAMUSCULAR | Status: DC | PRN
  Administered 2018-04-20 – 2018-04-21 (×2): 4 mg via INTRAVENOUS
  Filled 2018-04-18 (×2): qty 2

## 2018-04-18 MED ORDER — VERAPAMIL HCL ER 240 MG PO TBCR
240.0000 mg | EXTENDED_RELEASE_TABLET | Freq: Every day | ORAL | Status: DC
  Filled 2018-04-18 (×2): qty 1

## 2018-04-18 MED ORDER — LISINOPRIL 20 MG PO TABS
20.0000 mg | ORAL_TABLET | Freq: Two times a day (BID) | ORAL | Status: DC
  Administered 2018-04-18 – 2018-04-20 (×3): 20 mg via ORAL
  Filled 2018-04-18 (×2): qty 1
  Filled 2018-04-18: qty 2

## 2018-04-18 MED ORDER — HYDRALAZINE HCL 50 MG PO TABS
100.0000 mg | ORAL_TABLET | Freq: Every day | ORAL | Status: DC
  Administered 2018-04-20 – 2018-04-24 (×2): 100 mg via ORAL
  Filled 2018-04-18 (×6): qty 2

## 2018-04-18 MED ORDER — SODIUM CHLORIDE 0.9 % IV BOLUS: 500.0000 mL | Freq: Once | INTRAVENOUS | Status: DC

## 2018-04-18 MED ORDER — TRAMADOL HCL 50 MG PO TABS
50.0000 mg | ORAL_TABLET | Freq: Four times a day (QID) | ORAL | Status: DC | PRN
  Administered 2018-04-20 – 2018-04-21 (×2): 50 mg via ORAL
  Filled 2018-04-18 (×3): qty 1

## 2018-04-18 MED ORDER — LISINOPRIL-HYDROCHLOROTHIAZIDE 20-12.5 MG PO TABS: 1.0000 | ORAL_TABLET | Freq: Two times a day (BID) | ORAL | Status: DC

## 2018-04-18 MED ORDER — AMIODARONE IV BOLUS ONLY 150 MG/100ML
INTRAVENOUS | Status: AC
  Administered 2018-04-18: 150 mg
  Filled 2018-04-18: qty 100

## 2018-04-18 MED ORDER — ACETAMINOPHEN 325 MG PO TABS
650.0000 mg | ORAL_TABLET | Freq: Four times a day (QID) | ORAL | Status: DC | PRN
  Administered 2018-04-26: 650 mg via ORAL
  Filled 2018-04-18: qty 2

## 2018-04-18 MED ORDER — VITAMIN D3 25 MCG (1000 UNIT) PO TABS
1000.0000 [IU] | ORAL_TABLET | Freq: Every day | ORAL | Status: DC
  Administered 2018-04-18 – 2018-04-30 (×12): 1000 [IU] via ORAL
  Filled 2018-04-18 (×15): qty 1

## 2018-04-18 MED ORDER — ENOXAPARIN SODIUM 40 MG/0.4ML ~~LOC~~ SOLN
40.0000 mg | SUBCUTANEOUS | Status: DC
  Filled 2018-04-18: qty 0.4

## 2018-04-18 MED ORDER — POTASSIUM CHLORIDE CRYS ER 20 MEQ PO TBCR
40.0000 meq | EXTENDED_RELEASE_TABLET | ORAL | Status: AC
  Administered 2018-04-18 (×2): 40 meq via ORAL
  Filled 2018-04-18 (×2): qty 2

## 2018-04-18 MED ORDER — VITAMIN B-12 1000 MCG PO TABS
1000.0000 ug | ORAL_TABLET | Freq: Every day | ORAL | Status: DC
  Administered 2018-04-19 – 2018-04-30 (×12): 1000 ug via ORAL
  Filled 2018-04-18 (×14): qty 1

## 2018-04-18 MED ORDER — SODIUM CHLORIDE 0.9 % IV SOLN
INTRAVENOUS | Status: DC
  Administered 2018-04-18 – 2018-04-22 (×6): via INTRAVENOUS

## 2018-04-18 MED ORDER — METOPROLOL TARTRATE 5 MG/5ML IV SOLN
5.0000 mg | INTRAVENOUS | Status: AC
  Administered 2018-04-18: 5 mg via INTRAVENOUS
  Filled 2018-04-18: qty 5

## 2018-04-18 MED ORDER — POLYETHYLENE GLYCOL 3350 17 G PO PACK
17.0000 g | PACK | Freq: Every day | ORAL | Status: DC | PRN
  Administered 2018-04-19: 17 g via ORAL
  Filled 2018-04-18: qty 1

## 2018-04-18 MED ORDER — SODIUM CHLORIDE 0.9 % IV SOLN
1000.0000 mL | Freq: Once | INTRAVENOUS | Status: AC
  Administered 2018-04-18: 1000 mL via INTRAVENOUS

## 2018-04-18 MED ORDER — AMIODARONE LOAD VIA INFUSION
150.0000 mg | Freq: Once | INTRAVENOUS | Status: DC
  Filled 2018-04-18: qty 83.34

## 2018-04-18 MED ORDER — DILTIAZEM HCL-DEXTROSE 100-5 MG/100ML-% IV SOLN (PREMIX)
5.0000 mg/h | INTRAVENOUS | Status: DC
  Administered 2018-04-18: 5 mg/h via INTRAVENOUS
  Administered 2018-04-18 – 2018-04-19 (×3): 15 mg/h via INTRAVENOUS
  Filled 2018-04-18 (×4): qty 100

## 2018-04-18 MED ORDER — PRAVASTATIN SODIUM 40 MG PO TABS
40.0000 mg | ORAL_TABLET | ORAL | Status: DC
  Administered 2018-04-19 – 2018-04-29 (×6): 40 mg via ORAL
  Filled 2018-04-18 (×7): qty 1

## 2018-04-18 MED ORDER — FERROUS SULFATE 325 (65 FE) MG PO TABS
325.0000 mg | ORAL_TABLET | Freq: Every day | ORAL | Status: DC
  Administered 2018-04-19 – 2018-04-30 (×12): 325 mg via ORAL
  Filled 2018-04-18 (×13): qty 1

## 2018-04-18 MED ORDER — HYDROCHLOROTHIAZIDE 12.5 MG PO CAPS
12.5000 mg | ORAL_CAPSULE | Freq: Two times a day (BID) | ORAL | Status: DC
  Administered 2018-04-18 – 2018-04-20 (×3): 12.5 mg via ORAL
  Filled 2018-04-18 (×6): qty 1

## 2018-04-18 NOTE — ED Notes (Signed)
Patient able to urinate in bed pan without difficulty. Resting comfortably in bed at this time.

## 2018-04-18 NOTE — ED Notes (Signed)
HR 160s-170s with cardizem at 15mg /hr. MD made aware.

## 2018-04-18 NOTE — ED Triage Notes (Signed)
PT arrives via ems from home. Pt fell Sunday and has not been able to get up since. Pt had a hair appt today and when she didn't show up, beautician sent her husband to check on pt. Pt heard the beautician's husband and yelled for help. Pt states she was in the kitchen when she fell and was close enough to pull herself to get water and food. Pt arrives saturated in urine with redness to peri area and right hip. Pt reports pain to her right hip, no shortening or rotation visualized. Pt a & o x 4. Pt's right eye red. Pt states she still has her contacts in.Multi colored bruising noted to pt's face.

## 2018-04-18 NOTE — ED Notes (Signed)
Patient's contacts removed by this RN and placed in sterile saline in sterile container. Labeled with correct eye and placed in patient's pocketbook.

## 2018-04-18 NOTE — H&P (Signed)
[facility] NAME: Destiny Oliver    MR#:  [id number]  DATE OF BIRTH:  1927-03-18  DATE OF ADMISSION:  04/18/2018  PRIMARY CARE PHYSICIAN: Juluis Pitch, MD   REQUESTING/REFERRING PHYSICIAN: Dr. Lavonia Drafts  CHIEF COMPLAINT:   Chief Complaint  Patient presents with  . Fall    HISTORY OF PRESENT ILLNESS:  Destiny Oliver  is a 84 y.o. female with a known history of hypertension, history of breast cancer status post right breast mastectomy, tachycardia presents to hospital secondary to fall. Patient is independent at baseline.  Lives at home by herself.  No history of prior falls.  She said she was at her house getting the Christmas staff ready suddenly turned around and fell on the floor.  She denies losing any consciousness.  But has been on the floor for almost 4 days until friends came to check on her this morning.  She was able to scoot on her elbows to get to the kitchen and had some fluids nearby to keep herself hydrated.  She complained of significant right hip pain as she fell on the right side.  There is bruising on the face and also around the hips.  X-rays of the hip and also CT of the head are negative for any acute findings.  She is being admitted for weakness and rhabdomyolysis.  PAST MEDICAL HISTORY:   Past Medical History:  Diagnosis Date  . Breast cancer ([place]) 1994   right breast  . Hypertension   . Tachycardia     PAST SURGICAL HISTORY:   Past Surgical History:  Procedure Laterality Date  . MASTECTOMY Right 1994    SOCIAL HISTORY:   Social History   Tobacco Use  . Smoking status: Never Smoker  . Smokeless tobacco: Never Used  Substance Use Topics  . Alcohol use: Never    Frequency: Never    FAMILY HISTORY:  No family history on file.  DRUG ALLERGIES:   Allergies  Allergen Reactions  . Penicillins Swelling    REVIEW OF SYSTEMS:   Review of Systems  Constitutional: Positive for  malaise/fatigue. Negative for chills, fever and weight loss.  HENT: Negative for ear discharge, ear pain, hearing loss and nosebleeds.   Eyes: Negative for blurred vision, double vision and photophobia.  Respiratory: Negative for cough, hemoptysis, shortness of breath and wheezing.   Cardiovascular: Negative for chest pain, palpitations, orthopnea and leg swelling.  Gastrointestinal: Negative for abdominal pain, constipation, diarrhea, heartburn, melena, nausea and vomiting.  Genitourinary: Negative for dysuria, frequency and urgency.  Musculoskeletal: Positive for joint pain and myalgias. Negative for back pain and neck pain.  Skin: Negative for rash.  Neurological: Negative for dizziness, tingling, sensory change, speech change, focal weakness and headaches.  Endo/Heme/Allergies: Does not bruise/bleed easily.  Psychiatric/Behavioral: Negative for depression.    MEDICATIONS AT HOME:   Prior to Admission medications   Medication Sig Start Date End Date Taking? Authorizing Provider  aspirin EC 81 MG tablet Take 81 mg by mouth daily.   Yes [provider]  cholecalciferol (VITAMIN D3) 25 MCG (1000 UT) tablet Take 1,000 Units by mouth daily.   Yes [provider]  cyanocobalamin ([employer] VITAMIN B12) 2000 MCG tablet Take 1,000 mcg by mouth daily.   Yes [provider]  ferrous sulfate 325 (65 FE) MG tablet Take 325 mg by mouth daily.   Yes [provider]  hydrALAZINE (APRESOLINE) 50 MG tablet Take 100 mg by  mouth daily. 03/29/15  Yes [provider]  lisinopril-hydrochlorothiazide (PRINZIDE,ZESTORETIC) 20-12.5 MG tablet Take 1 tablet by mouth 2 (two) times daily. 08/20/15  Yes [provider]  metoprolol (TOPROL-XL) 200 MG 24 hr tablet Take 100 mg by mouth 2 (two) times daily. 07/08/15  Yes [provider]  Multiple Vitamins-Minerals (HAIR SKIN AND NAILS FORMULA) TABS Take 1 tablet by mouth daily.   Yes [provider]    pravastatin (PRAVACHOL) 40 MG tablet Take 40 mg by mouth every other day. 02/08/15  Yes [provider]  verapamil (CALAN-SR) 240 MG CR tablet Take 240 mg by mouth daily. 08/09/15  Yes [provider]      VITAL SIGNS:  Blood pressure 138/84, pulse 89, temperature 97.6 F (36.4 C), temperature source Oral, resp. rate 16, height 5\' 1"  (1.549 m), weight 86.2 kg, SpO2 97 %.  PHYSICAL EXAMINATION:   Physical Exam  GENERAL:  84 y.o.-year-old elderly patient lying in the bed with no acute distress.  EYES: Pupils equal, round, reactive to light and accommodation. No scleral icterus. Extraocular muscles intact.  HEENT: Head, bruising around right eye and right side of the face, normocephalic. Oropharynx and nasopharynx clear.  NECK:  Supple, no jugular venous distention. No thyroid enlargement, no tenderness.  LUNGS: Normal breath sounds bilaterally, no wheezing, rales,rhonchi or crepitation. No use of accessory muscles of respiration. Decreased bibasilar breath sounds CARDIOVASCULAR: S1, S2 normal. No  rubs, or gallops. 2/6 systolic murmur present ABDOMEN: Soft, nontender, nondistended. Bowel sounds present. No organomegaly or mass.  EXTREMITIES: No pedal edema, cyanosis, or clubbing. Right hip bruising noted. NEUROLOGIC: Cranial nerves II through XII are intact. Muscle strength 5/5 in all extremities. Sensation intact. Gait not checked.  PSYCHIATRIC: The patient is alert and oriented x 3.  SKIN: No obvious rash, lesion, or ulcer.   LABORATORY PANEL:   CBC Recent Labs  Lab 04/18/18 1128  WBC 11.4*  HGB 15.1*  HCT 47.1*  PLT 280   ------------------------------------------------------------------------------------------------------------------  Chemistries  Recent Labs  Lab 04/18/18 1128  NA 142  K 3.2*  CL 106  CO2 20*  GLUCOSE 129*  BUN 38*  CREATININE 1.21*  CALCIUM 9.6  MG 2.0  AST 119*  ALT 77*  ALKPHOS 75  BILITOT 1.4*    ------------------------------------------------------------------------------------------------------------------  Cardiac Enzymes Recent Labs  Lab 04/18/18 1128  TROPONINI 0.20*   ------------------------------------------------------------------------------------------------------------------  RADIOLOGY:  Ct Head Wo Contrast  Result Date: 04/18/2018 CLINICAL DATA:  Head trauma EXAM: CT HEAD WITHOUT CONTRAST CT MAXILLOFACIAL WITHOUT CONTRAST TECHNIQUE: Multidetector CT imaging of the head and maxillofacial structures were performed using the standard protocol without intravenous contrast. Multiplanar CT image reconstructions of the maxillofacial structures were also generated. COMPARISON:  CT head 07/12/2014, MRI 01/19/2007 FINDINGS: CT HEAD FINDINGS Brain: Mild to moderate atrophy. Hypodensity in the cerebral white matter bilaterally consistent with chronic microvascular ischemia. 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 Adjacent developmental venous anomaly also present. Negative for acute infarct, hemorrhage, mass.  No midline shift. Vascular: Atherosclerotic calcification. Negative for hyperdense vessel Skull: Negative Other: None CT MAXILLOFACIAL FINDINGS Osseous: Negative for facial fracture Orbits: Bilateral cataract surgery. Scleral band on the left. No orbital mass. Sinuses: Large retention cyst right maxillary sinus. No air-fluid level. Soft tissues: No soft tissue mass or swelling. IMPRESSION: 1. No acute intracranial abnormality 2. Negative for facial fracture. Electronically Signed   By: Franchot Gallo M.D.   On: 04/18/2018 12:52   Dg  Hip Unilat With Pelvis 2-3 Views Left  Result Date: 04/18/2018 CLINICAL DATA:  Right hip pain after unwitnessed fall 4 days ago. EXAM: DG HIP (WITH OR WITHOUT PELVIS) 2-3V LEFT COMPARISON:  None. FINDINGS: There is no evidence of hip fracture or dislocation. There is no evidence  of arthropathy or other focal bone abnormality. IMPRESSION: Negative. Electronically Signed   By: Marijo Conception, M.D.   On: 04/18/2018 12:40   Dg Hip Unilat  With Pelvis 2-3 Views Right  Result Date: 04/18/2018 CLINICAL DATA:  Unwitnessed fall 4 days ago.  Pain. EXAM: DG HIP (WITH OR WITHOUT PELVIS) 2-3V RIGHT COMPARISON:  None. FINDINGS: There is no evidence of hip fracture or dislocation. There is no evidence of arthropathy or other focal bone abnormality. IMPRESSION: Negative. Electronically Signed   By: Dorise Bullion III M.D   On: 04/18/2018 12:39   Ct Maxillofacial Wo Contrast  Result Date: 04/18/2018 CLINICAL DATA:  Head trauma EXAM: CT HEAD WITHOUT CONTRAST CT MAXILLOFACIAL WITHOUT CONTRAST TECHNIQUE: Multidetector CT imaging of the head and maxillofacial structures were performed using the standard protocol without intravenous contrast. Multiplanar CT image reconstructions of the maxillofacial structures were also generated. COMPARISON:  CT head 07/12/2014, MRI 01/19/2007 FINDINGS: CT HEAD FINDINGS Brain: Mild to moderate atrophy. Hypodensity in the cerebral white matter bilaterally consistent with chronic microvascular ischemia. 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 Adjacent developmental venous anomaly also present. Negative for acute infarct, hemorrhage, mass.  No midline shift. Vascular: Atherosclerotic calcification. Negative for hyperdense vessel Skull: Negative Other: None CT MAXILLOFACIAL FINDINGS Osseous: Negative for facial fracture Orbits: Bilateral cataract surgery. Scleral band on the left. No orbital mass. Sinuses: Large retention cyst right maxillary sinus. No air-fluid level. Soft tissues: No soft tissue mass or swelling. IMPRESSION: 1. No acute intracranial abnormality 2. Negative for facial fracture. Electronically Signed   By: Franchot Gallo M.D.   On: 04/18/2018 12:52    EKG:   Orders placed or  performed during the hospital encounter of 04/18/18  . ED EKG  . ED EKG  . EKG 12-Lead  . EKG 12-Lead    IMPRESSION AND PLAN:   Destiny Oliver  is a 84 y.o. female with a known history of hypertension, history of breast cancer status post right breast mastectomy, tachycardia presents to hospital secondary to fall.  1.  Acute rhabdomyolysis-secondary to fall and being on the floor for 4 days. -Elevated transaminases secondary to rhabdo -IV fluids.  Monitor CK.  Also monitor renal function  2.  Elevated troponin-likely secondary to poor renal clearance and also rhabdomyolysis. -Recycle troponins.  Will hold off on heparin drip unless troponins worsening  3.  Hypokalemia-we will replace  4.  Fall-still mechanical fall, no syncope reported -Physical therapy consult requested -CT of the head and maxillofacial sinus negative for any fractures. -X-ray of the right hip negative for any fractures.  Patient is able to move without any limitation to range of motion.  Pain likely from bruising of the muscles  5.  Hypertension-we will continue lisinopril hydrochlorothiazide, hydralazine and metoprolol and verapamil  6.  Atrial fibrillation with RVR versus sinus arrhythmia-continue verapamil and metoprolol.  Patient is placed on Cardizem drip concern for A. fib RVR here in the ED. -We will hold off on anticoagulation due to high risk of falls. -Check echocardiogram  7.  DVT prophylaxis-Lovenox  PT consulted   All the records are reviewed and case discussed with ED provider.  Management plans discussed with the patient, family and they are in agreement.  CODE STATUS: Full Code  TOTAL TIME TAKING CARE OF THIS PATIENT: 50 minutes.    Gladstone Lighter M.D on 04/18/2018 at 4:04 PM  Between 7am to 6pm - Pager - [phone]  After 6pm go to [email or web address] - password [email or web address] [facility]  Office  [phone]  CC: Primary care physician; Juluis Pitch, MD

## 2018-04-18 NOTE — Progress Notes (Signed)
Patient's heart rate persistently in the 150s.  Received 1 dose of IV metoprolol push, 1 dose of IV digoxin and already maxed out on Cardizem infusion. -Remains in A. fib.  Heparin drip started for A. fib -Started on amiodarone drip. -Blood pressure slowly dropping.  Will admit to stepdown unit.  Awaiting callback from the intensivist.

## 2018-04-18 NOTE — Consult Note (Signed)
[facility] Consultation Note  Patient ID: Destiny Oliver, MRN: [id number], DOB/AGE: [id number] 82 y.o. Admit date: 04/18/2018   Date of Consult: 04/18/2018 Primary Physician: Juluis Pitch, MD Primary Cardiologist: None  Chief Complaint:  Chief Complaint  Patient presents with  . Fall   Reason for Consult: A. fib  HPI: 84 y.o. female with no previous cardiovascular history but has cardiovascular risk factors including diabetes hypertension hyperlipidemia previously appropriately treated for which the patient has done fairly well.  The patient had a fall at the house and was on the floor for 2 to 3days not able to get up and around.  When she was finally seen the patient came to the emergency room with edema scrapes bruises and an elevated CK of 1767 and a glomerular filtration rate of 46 with a troponin of 0.2 consistent with rhabdomyolysis.  The patient also had atrial fibrillation with rapid ventricular rate for which currently she is not have any heart failure or myocardial infarction or chest pain.  The patient has done well with intravenous hydration and feeling much better at this time.  Past Medical History:  Diagnosis Date  . Breast cancer ([place]) 1994   right breast  . CKD (chronic kidney disease)   . Hypertension   . Tachycardia       Surgical History:  Past Surgical History:  Procedure Laterality Date  . MASTECTOMY Right 1994     Home Meds: Prior to Admission medications   Medication Sig Start Date End Date Taking? Authorizing Provider  aspirin EC 81 MG tablet Take 81 mg by mouth daily.   Yes [provider]  cholecalciferol (VITAMIN D3) 25 MCG (1000 UT) tablet Take 1,000 Units by mouth daily.   Yes [provider]  cyanocobalamin ([employer] VITAMIN B12) 2000 MCG tablet Take 1,000 mcg by mouth daily.   Yes [provider]  ferrous sulfate 325 (65 FE) MG tablet Take 325 mg by mouth daily.   Yes [provider]   hydrALAZINE (APRESOLINE) 50 MG tablet Take 100 mg by mouth daily. 03/29/15  Yes [provider]  lisinopril-hydrochlorothiazide (PRINZIDE,ZESTORETIC) 20-12.5 MG tablet Take 1 tablet by mouth 2 (two) times daily. 08/20/15  Yes [provider]  metoprolol (TOPROL-XL) 200 MG 24 hr tablet Take 100 mg by mouth 2 (two) times daily. 07/08/15  Yes [provider]  Multiple Vitamins-Minerals (HAIR SKIN AND NAILS FORMULA) TABS Take 1 tablet by mouth daily.   Yes [provider]  pravastatin (PRAVACHOL) 40 MG tablet Take 40 mg by mouth every other day. 02/08/15  Yes [provider]  verapamil (CALAN-SR) 240 MG CR tablet Take 240 mg by mouth daily. 08/09/15  Yes [provider]    Inpatient Medications:  . potassium chloride  40 mEq Oral Q4H   . diltiazem (CARDIZEM) infusion 15 mg/hr (04/18/18 1605)    Allergies:  Allergies  Allergen Reactions  . Penicillins Swelling    Social History   Socioeconomic History  . Marital status: Married    Spouse name: Not on file  . Number of children: Not on file  . Years of education: Not on file  . Highest education level: Not on file  Occupational History  . Not on file  Social Needs  . Financial resource strain: Not on file  . Food insecurity:    Worry: Not on file    Inability: Not on file  . Transportation needs:    Medical: Not on file  Non-medical: Not on file  Tobacco Use  . Smoking status: Never Smoker  . Smokeless tobacco: Never Used  Substance and Sexual Activity  . Alcohol use: Never    Frequency: Never  . Drug use: Never  . Sexual activity: Not on file  Lifestyle  . Physical activity:    Days per week: Not on file    Minutes per session: Not on file  . Stress: Not on file  Relationships  . Social connections:    Talks on phone: Not on file    Gets together: Not on file    Attends religious service: Not on file    Active member of club or organization: Not on file     Attends meetings of clubs or organizations: Not on file    Relationship status: Not on file  . Intimate partner violence:    Fear of current or ex partner: Not on file    Emotionally abused: Not on file    Physically abused: Not on file    Forced sexual activity: Not on file  Other Topics Concern  . Not on file  Social History Narrative  . Not on file     No family history on file.   Review of Systems Positive for aches and pains and fall Negative for: General:  chills, fever, night sweats or weight changes.  Cardiovascular: PND orthopnea syncope dizziness  Dermatological skin lesions rashes Respiratory: Cough congestion Urologic: Frequent urination urination at night and hematuria Abdominal: negative for nausea, vomiting, diarrhea, bright red blood per rectum, melena, or hematemesis Neurologic: negative for visual changes, and/or hearing changes  All other systems reviewed and are otherwise negative except as noted above.  Labs: Recent Labs    04/18/18 1128  CKTOTAL 1,767*  TROPONINI 0.20*   Lab Results  Component Value Date   WBC 11.4 (H) 04/18/2018   HGB 15.1 (H) 04/18/2018   HCT 47.1 (H) 04/18/2018   MCV 88.9 04/18/2018   PLT 280 04/18/2018    Recent Labs  Lab 04/18/18 1128  NA 142  K 3.2*  CL 106  CO2 20*  BUN 38*  CREATININE 1.21*  CALCIUM 9.6  PROT 7.2  BILITOT 1.4*  ALKPHOS 75  ALT 77*  AST 119*  GLUCOSE 129*   No results found for: CHOL, HDL, LDLCALC, TRIG No results found for: DDIMER  Radiology/Studies:  Ct Head Wo Contrast  Result Date: 04/18/2018 CLINICAL DATA:  Head trauma EXAM: CT HEAD WITHOUT CONTRAST CT MAXILLOFACIAL WITHOUT CONTRAST TECHNIQUE: Multidetector CT imaging of the head and maxillofacial structures were performed using the standard protocol without intravenous contrast. Multiplanar CT image reconstructions of the maxillofacial structures were also generated. COMPARISON:  CT head 07/12/2014, MRI 01/19/2007 FINDINGS: CT HEAD  FINDINGS Brain: Mild to moderate atrophy. Hypodensity in the cerebral white matter bilaterally consistent with chronic microvascular ischemia. 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 Adjacent developmental venous anomaly also present. Negative for acute infarct, hemorrhage, mass.  No midline shift. Vascular: Atherosclerotic calcification. Negative for hyperdense vessel Skull: Negative Other: None CT MAXILLOFACIAL FINDINGS Osseous: Negative for facial fracture Orbits: Bilateral cataract surgery. Scleral band on the left. No orbital mass. Sinuses: Large retention cyst right maxillary sinus. No air-fluid level. Soft tissues: No soft tissue mass or swelling. IMPRESSION: 1. No acute intracranial abnormality 2. Negative for facial fracture. Electronically Signed   By: Franchot Gallo M.D.   On: 04/18/2018 12:52   Dg Hip Unilat With  Pelvis 2-3 Views Left  Result Date: 04/18/2018 CLINICAL DATA:  Right hip pain after unwitnessed fall 4 days ago. EXAM: DG HIP (WITH OR WITHOUT PELVIS) 2-3V LEFT COMPARISON:  None. FINDINGS: There is no evidence of hip fracture or dislocation. There is no evidence of arthropathy or other focal bone abnormality. IMPRESSION: Negative. Electronically Signed   By: Marijo Conception, M.D.   On: 04/18/2018 12:40   Dg Hip Unilat  With Pelvis 2-3 Views Right  Result Date: 04/18/2018 CLINICAL DATA:  Unwitnessed fall 4 days ago.  Pain. EXAM: DG HIP (WITH OR WITHOUT PELVIS) 2-3V RIGHT COMPARISON:  None. FINDINGS: There is no evidence of hip fracture or dislocation. There is no evidence of arthropathy or other focal bone abnormality. IMPRESSION: Negative. Electronically Signed   By: Dorise Bullion III M.D   On: 04/18/2018 12:39   Ct Maxillofacial Wo Contrast  Result Date: 04/18/2018 CLINICAL DATA:  Head trauma EXAM: CT HEAD WITHOUT CONTRAST CT MAXILLOFACIAL WITHOUT CONTRAST TECHNIQUE: Multidetector CT imaging of the head and  maxillofacial structures were performed using the standard protocol without intravenous contrast. Multiplanar CT image reconstructions of the maxillofacial structures were also generated. COMPARISON:  CT head 07/12/2014, MRI 01/19/2007 FINDINGS: CT HEAD FINDINGS Brain: Mild to moderate atrophy. Hypodensity in the cerebral white matter bilaterally consistent with chronic microvascular ischemia. 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 Adjacent developmental venous anomaly also present. Negative for acute infarct, hemorrhage, mass.  No midline shift. Vascular: Atherosclerotic calcification. Negative for hyperdense vessel Skull: Negative Other: None CT MAXILLOFACIAL FINDINGS Osseous: Negative for facial fracture Orbits: Bilateral cataract surgery. Scleral band on the left. No orbital mass. Sinuses: Large retention cyst right maxillary sinus. No air-fluid level. Soft tissues: No soft tissue mass or swelling. IMPRESSION: 1. No acute intracranial abnormality 2. Negative for facial fracture. Electronically Signed   By: Franchot Gallo M.D.   On: 04/18/2018 12:52    EKG: Atrial fibrillation with rapid ventricular rate  Weights: Filed Weights   04/18/18 1121  Weight: 86.2 kg     Physical Exam: Blood pressure (!) 144/92, pulse (!) 154, temperature 97.6 F (36.4 C), temperature source Oral, resp. rate 19, height 5\' 1"  (1.549 m), weight 86.2 kg, SpO2 96 %. Body mass index is 35.9 kg/m. General: Well developed, well nourished, in no acute distress. Head eyes ears nose throat: Normocephalic, traumatic, sclera non-icteric, no xanthomas, nares are without discharge. No apparent thyromegaly and/or mass  Lungs: Normal respiratory effort.  no wheezes, no rales, no rhonchi.  Heart: Irregular with normal S1 S2. no murmur gallop, no rub, PMI is normal size and placement, carotid upstroke normal without bruit, jugular venous pressure is normal Abdomen:  Soft, non-tender, non-distended with normoactive bowel sounds. No hepatomegaly. No rebound/guarding. No obvious abdominal masses. Abdominal aorta is normal size without bruit Extremities: Trace edema. no cyanosis, no clubbing, no ulcers  Peripheral : 2+ bilateral upper extremity pulses, 2+ bilateral femoral pulses, 2+ bilateral dorsal pedal pulse Neuro: Alert and oriented. No facial asymmetry. No focal deficit. Moves all extremities spontaneously. Musculoskeletal: Normal muscle tone without kyphosis Psych:  Responds to questions appropriately with a normal affect.    Assessment: 84 year old female with hypertension hyperlipidemia having a fall with rhabdomyolysis chronic acute on chronic kidney disease and atrial fibrillation with rapid ventricular rate without evidence of myocardial infarction or heart failure  Plan: 1.  Continue hydration for rhabdomyolysis dehydration and syncope 2.  Continue heart rate control of atrial  fibrillation with diltiazem intravenously while patient recovers from above 3.  Anticoagulation with heparin for further risk reduction of stroke with atrial fibrillation 4.  Continue supportive care from fall 5.  Further diagnostic testing and treatment options after above  Signed, Corey Skains M.D. [facility] 04/18/2018, 5:10 PM

## 2018-04-18 NOTE — Consult Note (Signed)
ANTICOAGULATION CONSULT NOTE - Follow Up Consult  Pharmacy Consult for Heparin infusion Indication: atrial fibrillation  Allergies  Allergen Reactions  . Penicillins Swelling    Patient Measurements: Height: 5\' 1"  (154.9 cm) Weight: 190 lb (86.2 kg) IBW/kg (Calculated) : 47.8 Heparin Dosing Weight: 67.7  Vital Signs: Temp: 97.6 F (36.4 C) (12/19 1120) Temp Source: Oral (12/19 1120) BP: 152/85 (12/19 1720) Pulse Rate: 158 (12/19 1720)  Labs: Recent Labs    04/18/18 1128  HGB 15.1*  HCT 47.1*  PLT 280  CREATININE 1.21*  CKTOTAL 1,767*  TROPONINI 0.20*    Estimated Creatinine Clearance: 31.4 mL/min (A) (by C-G formula based on SCr of 1.21 mg/dL (H)).   Medications:  (Not in a hospital admission)   Assessment: 84 y.o. female with a known history of hypertension, history of breast cancer status post right breast mastectomy, tachycardia presents to hospital secondary to fall.  The patient also had atrial fibrillation with rapid ventricular rate for which currently she is not have any heart failure or myocardial infarction or chest pain.  Per cardiology patient recommended to be started on heparin drip.  Goal of Therapy:  Heparin level 0.3-0.7 units/ml Monitor platelets by anticoagulation protocol: Yes   Plan:  Due to recent fall and per Dr. Tressia Miners will hold bolus at the time of initiation. Start heparin infusion at 1000 units/hr Check heparin level every 8 hours until two consecutive therapeutic levels then daily and CBC daily while on heparin Continue to monitor H&H and platelets  Forrest Moron, PharmD Clinical Pharmacist 04/18/2018,6:01 PM

## 2018-04-18 NOTE — ED Provider Notes (Signed)
[facility] Emergency Department Provider Note   ____________________________________________    I have reviewed the triage vital signs and the nursing notes.   HISTORY  Chief Complaint Fall     HPI Destiny Oliver is a 84 y.o. female who presents after a fall.  Patient reports 4 days ago she lost her balance in the kitchen and fell over injuring her right hip.  She was unable to stand up.  She was unable to get to the phone to notify help.  Eventually she was found lying on the floor today.  She complains of right hip pain, swelling to the right thigh where she hit her head.  No LOC.  No neuro deficits.  Denies chest pain but has felt occasional palpitations.  Past Medical History:  Diagnosis Date  . Breast cancer ([place]) 1994   right breast    There are no active problems to display for this patient.   Past Surgical History:  Procedure Laterality Date  . MASTECTOMY Right 1994    Prior to Admission medications   Not on File     Allergies Penicillins  No family history on file.  Social History Social History   Tobacco Use  . Smoking status: Never Smoker  . Smokeless tobacco: Never Used  Substance Use Topics  . Alcohol use: Never    Frequency: Never  . Drug use: Never    Review of Systems  Constitutional: No fever/chills Eyes: No visual changes.  ENT: No neck pain Cardiovascular: Denies chest pain.  Palpitations as above Respiratory: Denies shortness of breath. Gastrointestinal: No abdominal pain.  No nausea, no vomiting.   Genitourinary: Negative for dysuria. Musculoskeletal: Right hip pain Skin: Skin irritation in urine Neurological: Negative for headaches or weakness   ____________________________________________   PHYSICAL EXAM:  VITAL SIGNS: ED Triage Vitals  Enc Vitals Group     BP 04/18/18 1120 (!) 153/96     Pulse Rate 04/18/18 1120 (!) 102     Resp 04/18/18 1120 (!) 24     Temp 04/18/18 1120 97.6 F  (36.4 C)     Temp Source 04/18/18 1120 Oral     SpO2 04/18/18 1120 96 %     Weight 04/18/18 1121 86.2 kg (190 lb)     Height 04/18/18 1121 1.549 m (5\' 1" )     Head Circumference --      Peak Flow --      Pain Score 04/18/18 1120 2     Pain Loc --      Pain Edu? --      Excl. in [place]? --     Constitutional: Alert and oriented.  Eyes: Conjunctivae are normal.  PERRLA, EOMI Head: Swelling around the right orbit, mild, old bruises to the right cheek. Nose: No swelling or tenderness Mouth/Throat: Mucous membranes are moist.   Neck:  Painless ROM, no vertebral tenderness palpation Cardiovascular: Normal rate, regular rhythm.  Good peripheral circulation.  No chest wall tenderness palpation Respiratory: Normal respiratory effort.  No retractions.  Lungs clear to auscultation bilaterally Gastrointestinal: Soft and nontender. No distention.   Musculoskeletal: Mild tenderness along the right greater trochanter, no shortening or rotation, warm and well perfused Neurologic:  Normal speech and language. No gross focal neurologic deficits are appreciated.  Skin:  Skin is warm, dry Psychiatric: Mood and affect are normal. Speech and behavior are normal.  ____________________________________________   LABS (all labs ordered are listed, but only abnormal results are displayed)  Labs  Reviewed  CBC WITH DIFFERENTIAL/PLATELET - Abnormal; Notable for the following components:      Result Value   WBC 11.4 (*)    RBC 5.30 (*)    Hemoglobin 15.1 (*)    HCT 47.1 (*)    Neutro Abs 9.1 (*)    All other components within normal limits  TROPONIN I - Abnormal; Notable for the following components:   Troponin I 0.20 (*)    All other components within normal limits  CK - Abnormal; Notable for the following components:   Total CK 1,767 (*)    All other components within normal limits  COMPREHENSIVE METABOLIC PANEL - Abnormal; Notable for the following components:   Potassium 3.2 (*)    CO2 20 (*)     Glucose, Bld 129 (*)    BUN 38 (*)    Creatinine, Ser 1.21 (*)    AST 119 (*)    ALT 77 (*)    Total Bilirubin 1.4 (*)    GFR calc non Af Amer 40 (*)    GFR calc Af Amer 46 (*)    Anion gap 16 (*)    All other components within normal limits   ____________________________________________  EKG  ED ECG REPORT I, Lavonia Drafts, the attending physician, personally viewed and interpreted this ECG.  Date: 04/18/2018  Rhythm: Atrial fibrillation with RVR QRS Axis: normal Intervals: normal ST/T Wave abnormalities: Nonspecific changes Narrative Interpretation: Atrial fibrillation with RVR  ____________________________________________  RADIOLOGY  Hip x-ray CT max face and CT head ____________________________________________   PROCEDURES  Procedure(s) performed: No  Procedures   Critical Care performed: yes  CRITICAL CARE Performed by: Lavonia Drafts   Total critical care time: 35 minutes  Critical care time was exclusive of separately billable procedures and treating other patients.  Critical care was necessary to treat or prevent imminent or life-threatening deterioration.  Critical care was time spent personally by me on the following activities: development of treatment plan with patient and/or surrogate as well as nursing, discussions with consultants, evaluation of patient's response to treatment, examination of patient, obtaining history from patient or surrogate, ordering and performing treatments and interventions, ordering and review of laboratory studies, ordering and review of radiographic studies, pulse oximetry and re-evaluation of patient's condition.  ____________________________________________   INITIAL IMPRESSION / ASSESSMENT AND PLAN / ED COURSE  Pertinent labs & imaging results that were available during my care of the patient were reviewed by me and considered in my medical decision making (see chart for details).  Patient presents after being  found down for over 4 days.  She primarily complains of right hip pain, doubt fracture based on exam but pending x-rays, labs including CK, troponin.  Apparently new onset atrial fibrillation with RVR.  We will give IV Cardizem bolus followed by Cardizem drip  She is troponin 0.2, likely related to strain from atrial fibrillation.  CK is elevated as well we will give IV fluids.  Certainly she is dehydrated based on her labs fluids infusing.  Patient will require admission, pending CT scans      ____________________________________________   FINAL CLINICAL IMPRESSION(S) / ED DIAGNOSES  Final diagnoses:  Atrial fibrillation, unspecified type ([place])  Fall, initial encounter  Contusion of face, initial encounter  Dehydration        Note:  This document was prepared using Dragon voice recognition software and may include unintentional dictation errors.   Lavonia Drafts, MD 04/18/18 [phone]

## 2018-04-19 ENCOUNTER — Inpatient Hospital Stay
Admit: 2018-04-19 | Discharge: 2018-04-19 | Disposition: A | Discharge status: Home / Self Care | Payer: Medicare Other | Attending: Internal Medicine | Admitting: Internal Medicine

## 2018-04-19 DIAGNOSIS — Z66 Do not resuscitate: Secondary | ICD-10-CM

## 2018-04-19 DIAGNOSIS — I480 Paroxysmal atrial fibrillation: Secondary | ICD-10-CM

## 2018-04-19 DIAGNOSIS — I4891 Unspecified atrial fibrillation: Secondary | ICD-10-CM

## 2018-04-19 DIAGNOSIS — Z7189 Other specified counseling: Secondary | ICD-10-CM

## 2018-04-19 DIAGNOSIS — W19XXXA Unspecified fall, initial encounter: Secondary | ICD-10-CM

## 2018-04-19 DIAGNOSIS — Z515 Encounter for palliative care: Secondary | ICD-10-CM

## 2018-04-19 DIAGNOSIS — S0083XA Contusion of other part of head, initial encounter: Secondary | ICD-10-CM

## 2018-04-19 DIAGNOSIS — E860 Dehydration: Secondary | ICD-10-CM

## 2018-04-19 LAB — URINALYSIS, COMPLETE (UACMP) WITH MICROSCOPIC
Bacteria, UA: NONE SEEN
Bilirubin Urine: NEGATIVE
Glucose, UA: NEGATIVE mg/dL
Ketones, ur: NEGATIVE mg/dL
Leukocytes, UA: NEGATIVE
Nitrite: NEGATIVE
Protein, ur: 100 mg/dL — AB
Specific Gravity, Urine: 1.026 (ref 1.005–1.030)
pH: 5 (ref 5.0–8.0)

## 2018-04-19 LAB — BASIC METABOLIC PANEL
Anion gap: 7 (ref 5–15)
BUN: 39 mg/dL — AB (ref 8–23)
CO2: 21 mmol/L — ABNORMAL LOW (ref 22–32)
Calcium: 8.5 mg/dL — ABNORMAL LOW (ref 8.9–10.3)
Chloride: 109 mmol/L (ref 98–111)
Creatinine, Ser: 1.23 mg/dL — ABNORMAL HIGH (ref 0.44–1.00)
GFR calc Af Amer: 45 mL/min — ABNORMAL LOW (ref >60)
GFR, EST NON AFRICAN AMERICAN: 39 mL/min — AB (ref >60)
GLUCOSE: 135 mg/dL — AB (ref 70–99)
Potassium: 3.6 mmol/L (ref 3.5–5.1)
Sodium: 137 mmol/L (ref 135–145)

## 2018-04-19 LAB — CBC
HCT: 38.7 % (ref 36.0–46.0)
Hemoglobin: 12.4 g/dL (ref 12.0–15.0)
MCH: 28.3 pg (ref 26.0–34.0)
MCHC: 32 g/dL (ref 30.0–36.0)
MCV: 88.4 fL (ref 80.0–100.0)
Platelets: 220 10*3/uL (ref 150–400)
RBC: 4.38 MIL/uL (ref 3.87–5.11)
RDW: 14.2 % (ref 11.5–15.5)
WBC: 10.3 10*3/uL (ref 4.0–10.5)
nRBC: 0 % (ref 0.0–0.2)

## 2018-04-19 LAB — HEPARIN LEVEL (UNFRACTIONATED)
HEPARIN UNFRACTIONATED: 0.25 [IU]/mL — AB (ref 0.30–0.70)
Heparin Unfractionated: 0.32 IU/mL (ref 0.30–0.70)
Heparin Unfractionated: 0.33 IU/mL (ref 0.30–0.70)

## 2018-04-19 LAB — MRSA PCR SCREENING: MRSA by PCR: NEGATIVE

## 2018-04-19 LAB — GLUCOSE, CAPILLARY: Glucose-Capillary: 131 mg/dL — ABNORMAL HIGH (ref 70–99)

## 2018-04-19 LAB — TROPONIN I
TROPONIN I: 0.16 ng/mL — AB (ref <0.03)
Troponin I: 0.24 ng/mL (ref <0.03)

## 2018-04-19 LAB — MAGNESIUM: Magnesium: 1.8 mg/dL (ref 1.7–2.4)

## 2018-04-19 LAB — CK: Total CK: 474 U/L — ABNORMAL HIGH (ref 38–234)

## 2018-04-19 LAB — PHOSPHORUS: Phosphorus: 2.4 mg/dL — ABNORMAL LOW (ref 2.5–4.6)

## 2018-04-19 MED ORDER — LACTATED RINGERS IV BOLUS
500.0000 mL | Freq: Once | INTRAVENOUS | Status: AC
  Administered 2018-04-19: 500 mL via INTRAVENOUS

## 2018-04-19 MED ORDER — MAGNESIUM SULFATE 2 GM/50ML IV SOLN
2.0000 g | Freq: Once | INTRAVENOUS | Status: AC
  Administered 2018-04-19: 2 g via INTRAVENOUS
  Filled 2018-04-19: qty 50

## 2018-04-19 MED ORDER — POTASSIUM CHLORIDE CRYS ER 20 MEQ PO TBCR
40.0000 meq | EXTENDED_RELEASE_TABLET | Freq: Once | ORAL | Status: AC
  Administered 2018-04-19: 40 meq via ORAL
  Filled 2018-04-19: qty 2

## 2018-04-19 MED ORDER — DILTIAZEM HCL ER COATED BEADS 120 MG PO CP24
240.0000 mg | ORAL_CAPSULE | Freq: Every day | ORAL | Status: DC
  Administered 2018-04-19 – 2018-04-24 (×6): 240 mg via ORAL
  Filled 2018-04-19 (×2): qty 2
  Filled 2018-04-19: qty 1
  Filled 2018-04-19 (×2): qty 2
  Filled 2018-04-19: qty 1

## 2018-04-19 MED ORDER — BISACODYL 10 MG RE SUPP
10.0000 mg | Freq: Once | RECTAL | Status: AC
  Administered 2018-04-19: 10 mg via RECTAL
  Filled 2018-04-19: qty 1

## 2018-04-19 NOTE — Consult Note (Signed)
ANTICOAGULATION CONSULT NOTE - Follow Up Consult  Pharmacy Consult for Heparin infusion Indication: atrial fibrillation  Allergies  Allergen Reactions  . Penicillins Swelling    Patient Measurements: Height: 5\' 1"  (154.9 cm) Weight: 190 lb (86.2 kg) IBW/kg (Calculated) : 47.8 Heparin Dosing Weight: 67.7  Vital Signs: Temp: 98.2 F (36.8 C) (12/19 2100) Temp Source: Oral (12/19 2100) BP: 138/79 (12/20 0300) Pulse Rate: 100 (12/20 0230)  Labs: Recent Labs    04/18/18 1128 04/18/18 1830 04/18/18 2327 04/19/18 0237  HGB 15.1* 13.5  --   --   HCT 47.1*  --   --   --   PLT 280  --   --   --   APTT  --  28  --   --   LABPROT  --  14.3  --   --   INR  --  1.12  --   --   HEPARINUNFRC  --   --   --  0.25*  CREATININE 1.21*  --   --   --   CKTOTAL 1,767*  --   --   --   TROPONINI 0.20* 0.25* 0.24*  --     Estimated Creatinine Clearance: 31.4 mL/min (A) (by C-G formula based on SCr of 1.21 mg/dL (H)).   Medications:  (Not in a hospital admission)   Assessment: 84 y.o. female with a known history of hypertension, history of breast cancer status post right breast mastectomy, tachycardia presents to hospital secondary to fall.  The patient also had atrial fibrillation with rapid ventricular rate for which currently she is not have any heart failure or myocardial infarction or chest pain.  Per cardiology patient recommended to be started on heparin drip.  Goal of Therapy:  Heparin level 0.3-0.7 units/ml Monitor platelets by anticoagulation protocol: Yes   Plan:  12/20 @ 0230 HL 0.25 subtherapeutic. Will increase rate to 1100 units/hr and will recheck HL @ 1000, will f/u w/ CBC w/ am labs.  Tobie Lords, PharmD Clinical Pharmacist 04/19/2018,3:18 AM

## 2018-04-19 NOTE — Consult Note (Signed)
Consultation Note Date: 04/19/2018   Patient Name: Destiny Oliver  DOB: [birth year]  MRN: [id number]  Age / Sex: 84 y.o., female  PCP: Juluis Pitch, MD Referring Physician: Gladstone Lighter, MD  Reason for Consultation: Establishing goals of care  HPI/Patient Profile: 84 y.o. female admitted on 04/18/2018 from home after playing falling and remained on floor for 4 days until found by friends. She has a past medical history of right breast cancer, hypertension, stage III chronic kidney disease, and tachycardia.  Presented to ED after friends to check on her and found her on the floor for almost 4 days.  She reported she was able to school on her elbows to get to the kitchen and hydrate herself.  Complained of right hip pain and has bruising to the face and hip area. X-ray of hip and head CT all negative for any acute findings.  Sodium 142, potassium 3.2, BUN 38, creatinine 1.21, magnesium 2.0, AST 119, ALT 77, CK 1767, troponin 0 0.20, WBC 11.4, hemoglobin 15.1.  This admission patient continued on home medications and receiving IV fluids and treatment for rhabdomyolysis.  Patient also placed on Cardizem drip for atrial fibrillation with RVR.  She has been seen by cardiology with recommendation to continue with hydration and rate control.  Further diagnostic testing and cardiac work-up pending.  Palliative medicine team consulted for goals of care discussion.  Clinical Assessment and Goals of Care: I have reviewed medical records including lab results, imaging, Epic notes, and MAR, received report from the bedside RN, and assessed the patient. I met at the bedside with patient and her son Shenique Childers to discuss diagnosis prognosis, [place], EOL wishes, disposition and options.  Patient is alert and oriented x3.  She is able to engage in goals of care discussion.  I introduced Palliative Medicine as specialized medical  care for people living with serious illness. It focuses on providing relief from the symptoms and stress of a serious illness. The goal is to improve quality of life for both the patient and the family.  We discussed a brief life review of the patient.  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  Has no biological kids however she does have 2 stepsons who she considers her very young children.  Her husband recently passed away approximately 4 weeks ago.  Patient is of Christian faith.   As far as functional and nutritional status patient and family report patient was completely independent prior to admission. Patient continues to drive and care for herself with family support. She is able to perform all ADLs independently. Patient reports she has a great appetite. Prior to my arrival patient had just finished lunch and ate 100% of her burger and fries.    We discussed her current illness and what it means in the larger context of her on-going co-morbidities.  Natural disease trajectory and expectations at EOL were discussed. Both patient and family verbalized understanding of her current condition.   I attempted to  elicit values and goals of care important to the patient.    The difference between aggressive medical intervention and comfort care was considered in light of the patient's goals of care.  Patient expresses she remains hopeful for continuous improvement and that she will be able to return to her independent baseline. She does verbalize her awareness of deconditioning since fall and that she would most likely need assistance at discharge. She is hopeful to return home with family and not have to go to SNF if possible. She reports she has a great support system at home and feels she can participate with therapy in the home. Family verbalized understanding and their agreement.   Patient has a documented advanced directive.  He confirms that her [facility] POA is her best  friend Dorothy Puffer which is documented.  I also discussed patient's current full CODE STATUS.  Patient expressed her wishes are to be a DNR/DNI as well as no interest in artificial feedings such as PEG tube or dialysis if faced with this decision.  Patient states she is hopeful to live to see 84 years old and from there she is prepared to go whenever God is ready for her to go!  Hospice and Palliative Care services outpatient were explained and offered.  Given patient's high functioning level patient prior to admission and her expressed goals to participate in rehabilitation I recommended at minimum outpatient Palliative. Patient and family verbalized their understanding and awareness. Patient expressed wishes for outpatient palliative at discharge.  They are aware in the future if patient's condition declines or she feels hospice is most appropriate she may discuss with her outpatient team for transitioning of care.   Questions and concerns were addressed. The family was encouraged to call with questions or concerns.  PMT will continue to support holistically.  Primary Decision Maker: Patient is A&O x3. She is capable of making medical decisions. In the event she is not she verbalized and has provided documented advanced directives.  PATIENT    SUMMARY OF RECOMMENDATIONS    DNR/DNI-as requested by patient (AD provided with wishes also)  Continue to treat the treatable without escalation of care.   Patient/Family expressed goals of care are to return home with family with HHPT/OT if recommended with outpatient palliative support.   Case Management referral for outpatient palliative.   Palliative Medicine team will continue to support patient, patient's family, and medical team during hospitalization.   Code Status/Advance Care Planning:  DNR/DNI-as confirmed by patient   Palliative Prophylaxis:   Aspiration, Delirium Protocol and Frequent Pain Assessment  Additional Recommendations  (Limitations, Scope, Preferences):  Full Scope Treatment  Psycho-social/Spiritual:   Desire for further Chaplaincy support:NO   Prognosis:   Guarded- in the setting of rhabdomyolysis, a-fib w/RVR, chronic kidney disease stage III, hypertension, and falls.   Discharge Planning: To Be Determined outpatient palliative services at home or at [facility] w/rehab.       Primary Diagnoses: Present on Admission: . A-fib [facility])   I have reviewed the medical record, interviewed the patient and family, and examined the patient. The following aspects are pertinent.  Past Medical History:  Diagnosis Date  . Breast cancer ([place]) 1994   right breast  . CKD (chronic kidney disease)   . Hypertension   . Tachycardia    Social History   Socioeconomic History  . Marital status: Married    Spouse name: Not on file  . Number of children: Not on file  . Years of education: Not  on file  . Highest education level: Not on file  Occupational History  . Not on file  Social Needs  . Financial resource strain: Not on file  . Food insecurity:    Worry: Not on file    Inability: Not on file  . Transportation needs:    Medical: Not on file    Non-medical: Not on file  Tobacco Use  . Smoking status: Never Smoker  . Smokeless tobacco: Never Used  Substance and Sexual Activity  . Alcohol use: Never    Frequency: Never  . Drug use: Never  . Sexual activity: Not on file  Lifestyle  . Physical activity:    Days per week: Not on file    Minutes per session: Not on file  . Stress: Not on file  Relationships  . Social connections:    Talks on phone: Not on file    Gets together: Not on file    Attends religious service: Not on file    Active member of club or organization: Not on file    Attends meetings of clubs or organizations: Not on file    Relationship status: Not on file  Other Topics Concern  . Not on file  Social History Narrative  . Not on file   No family history on file. Scheduled  Meds: . amiodarone  150 mg Intravenous Once  . aspirin EC  81 mg Oral Daily  . bisacodyl  10 mg Rectal Once  . cholecalciferol  1,000 Units Oral Daily  . diltiazem  240 mg Oral Daily  . docusate sodium  100 mg Oral BID  . ferrous sulfate  325 mg Oral Daily  . hydrALAZINE  100 mg Oral Daily  . lisinopril  20 mg Oral BID   And  . hydrochlorothiazide  12.5 mg Oral BID  . metoprolol  100 mg Oral BID  . multivitamin with minerals  1 tablet Oral Daily  . pravastatin  40 mg Oral QODAY  . cyanocobalamin  1,000 mcg Oral Daily   Continuous Infusions: . sodium chloride 75 mL/hr at 04/19/18 1000  . amiodarone 30 mg/hr (04/19/18 1528)  . heparin 1,100 Units/hr (04/19/18 1000)   PRN Meds:.acetaminophen **OR** acetaminophen, ondansetron **OR** ondansetron (ZOFRAN) IV, polyethylene glycol, traMADol Medications Prior to Admission:  Prior to Admission medications   Medication Sig Start Date End Date Taking? Authorizing Provider  aspirin EC 81 MG tablet Take 81 mg by mouth daily.   Yes [provider]  cholecalciferol (VITAMIN D3) 25 MCG (1000 UT) tablet Take 1,000 Units by mouth daily.   Yes [provider]  cyanocobalamin ([employer] VITAMIN B12) 2000 MCG tablet Take 1,000 mcg by mouth daily.   Yes [provider]  ferrous sulfate 325 (65 FE) MG tablet Take 325 mg by mouth daily.   Yes [provider]  hydrALAZINE (APRESOLINE) 50 MG tablet Take 100 mg by mouth daily. 03/29/15  Yes [provider]  lisinopril-hydrochlorothiazide (PRINZIDE,ZESTORETIC) 20-12.5 MG tablet Take 1 tablet by mouth 2 (two) times daily. 08/20/15  Yes [provider]  metoprolol (TOPROL-XL) 200 MG 24 hr tablet Take 100 mg by mouth 2 (two) times daily. 07/08/15  Yes [provider]  Multiple Vitamins-Minerals (HAIR SKIN AND NAILS FORMULA) TABS Take 1 tablet by mouth daily.   Yes [provider]  pravastatin (PRAVACHOL) 40 MG tablet Take 40 mg by mouth every other  day. 02/08/15  Yes [provider]  verapamil (CALAN-SR) 240 MG CR tablet Take 240  mg by mouth daily. 08/09/15  Yes [provider]   Allergies  Allergen Reactions  . Penicillins Swelling   Review of Systems  Constitutional: Positive for fatigue.  Skin:       Bruising r/t falls   Neurological: Positive for dizziness and weakness.  All other systems reviewed and are negative.   Physical Exam Vitals signs and nursing note reviewed.  Constitutional:      General: She is awake.  Cardiovascular:     Rate and Rhythm: Rhythm irregular.     Pulses: Normal pulses.     Heart sounds: Normal heart sounds.     Comments: Trace edema  Pulmonary:     Effort: Pulmonary effort is normal.     Breath sounds: Decreased breath sounds present.  Musculoskeletal:     Comments: Generalized weakness, due to fall and pain   Skin:    Findings: Bruising and ecchymosis present.     Comments: Facial , arm s/p fall   Neurological:     Mental Status: She is alert and oriented to person, place, and time.     Motor: Weakness present.  Psychiatric:        Attention and Perception: Attention normal.        Mood and Affect: Mood normal.        Speech: Speech normal.        Behavior: Behavior is cooperative.        Thought Content: Thought content normal.        Cognition and Memory: Cognition normal.        Judgment: Judgment normal.     Vital Signs: BP 117/69   Pulse (!) 124   Temp 97.7 F (36.5 C) (Oral)   Resp (!) 23   Ht 5' 1"  (1.549 m)   Wt 86.8 kg   SpO2 95%   BMI 36.16 kg/m  Pain Scale: 0-10   Pain Score: 2    SpO2: SpO2: 95 % O2 Device:SpO2: 95 % O2 Flow Rate: .   IO: Intake/output summary:   Intake/Output Summary (Last 24 hours) at 04/19/2018 1601 Last data filed at 04/19/2018 1437 Gross per 24 hour  Intake 1787.97 ml  Output 1 ml  Net 1786.97 ml    LBM: Last BM Date: 04/18/18 Baseline Weight: Weight: 86.2 kg Most recent weight: Weight: 86.8 kg       Palliative Assessment/Data: PPS 40%    Time In: 1230 Time Out: 1335 Time Total: 65 min.   Greater than 50%  of this time was spent counseling and coordinating care related to the above assessment and plan.  Signed by:  Alda Lea, AGPCNP-BC Palliative Medicine Team  Phone: [phone] Fax: [phone] Pager: [phone] Amion: Bjorn Pippin    Please contact Palliative Medicine Team phone at [phone] for questions and concerns.  For individual provider: See Shea Evans

## 2018-04-19 NOTE — ED Notes (Signed)
New NS and Diltiazem bags hung and infusing.

## 2018-04-19 NOTE — Progress Notes (Signed)
PT Cancellation Note  Patient Details Name: Destiny Oliver MRN: [id number] DOB: August 16, 1926   Cancelled Treatment:    Reason Eval/Treat Not Completed: Pain limiting ability to participate;Fatigue/lethargy limiting ability to participate.  Pt is limited by her fall and feeling too sensitive with injury/soreness to attempt.  Agreed to get up in the AM.   Ramond Dial 04/19/2018, 10:30 AM   Mee Hives, PT MS Acute Rehab Dept. Number: [place]

## 2018-04-19 NOTE — Progress Notes (Signed)
*  PRELIMINARY RESULTS* Echocardiogram 2D Echocardiogram has been performed.  Destiny Oliver 04/19/2018, 4:36 PM

## 2018-04-19 NOTE — Progress Notes (Signed)
CRITICAL CARE NOTE  CC  AFib with  RVR  SUBJECTIVE 84 yo white female found unresponsive at home 4 days CK elevated, afib with RVR Placed on cardizem and amiodarone infusion, on heparin infusion Admitted to SD for closer monitoring  Alert and awake unknown why she fell but felt dizzy at the time-probably from afib with RVR    SIGNIFICANT EVENTS    BP 117/69   Pulse (!) 124   Temp 97.7 F (36.5 C) (Oral)   Resp (!) 23   Ht 5\' 1"  (1.549 m)   Wt 86.8 kg   SpO2 95%   BMI 36.16 kg/m    Review of Systems:  Gen:  Denies  fever, sweats, chills weigh loss  HEENT: Denies blurred vision, double vision, ear pain, eye pain, hearing loss, nose bleeds, sore throat Cardiac:  No dizziness, chest pain or heaviness, chest tightness,edema, No JVD Resp:   No cough, -sputum production, -shortness of breath,-wheezing, -hemoptysis,  Gi: Denies swallowing difficulty, stomach pain, nausea or vomiting, diarrhea, constipation, bowel incontinence Gu:  Denies bladder incontinence, burning urine Ext:   Denies Joint pain, stiffness or swelling Skin: Denies  skin rash, easy bruising or bleeding or hives Endoc:  Denies polyuria, polydipsia , polyphagia or weight change Psych:   Denies depression, insomnia or hallucinations  Other:  All other systems negative   Physical Examination:   GENERAL:NAD, no fevers, chills, +weakness + fatigue Ecchymosis around RT eye HEAD: Normocephalic, atraumatic.  EYES: Pupils equal, round, reactive to light. Extraocular muscles intact. No scleral icterus.  MOUTH: Moist mucosal membrane. Dentition intact. No abscess noted.  EAR, NOSE, THROAT: Clear without exudates. No external lesions.  NECK: Supple. No thyromegaly. No nodules. No JVD.  PULMONARY: CTA B/L no wheezing, rhonchi, crackles CARDIOVASCULAR: S1 and S2. Regular rate and rhythm. No murmurs, rubs, or gallops. No edema. Pedal pulses 2+ bilaterally.  GASTROINTESTINAL: Soft, nontender, nondistended. No  masses. Positive bowel sounds. No hepatosplenomegaly.  MUSCULOSKELETAL: +edema. Range of motion full in all extremities.  NEUROLOGIC: Cranial nerves II through XII are intact. No gross focal neurological deficits. Sensation intact. Reflexes intact.  SKIN: No ulceration, lesions, rashes, or cyanosis. Skin warm and dry. Turgor intact.  PSYCHIATRIC: Mood, affect within normal limits. The patient is awake, alert and oriented x 3. Insight, judgment intact.  ALL OTHER ROS ARE NEGATIVE    Labs reviewed    ASSESSMENT AND PLAN SYNOPSIS   CARDIAC FAILURE-Afib with RVR amiodarone infusion -oxygen as needed -Lasix as tolerated -follow up cardiac enzymes as indicated Follow up cardiology recs   Rhabdomyolysis-continue IVF's   NEUROLOGY Alert and awake   CARDIAC ICU monitoring  ID -check UA    GI/Nutrition GI PROPHYLAXIS as indicated DIET-->start Diet Constipation protocol as indicated   ELECTROLYTES -follow labs as needed -replace as needed -pharmacy consultation and following   DVT/GI PRX ordered TRANSFUSIONS AS NEEDED MONITOR FSBS ASSESS the need for LABS as needed     Corrin Parker, M.D.  [facility]  Medical Director [place] Director [place] [facility]

## 2018-04-19 NOTE — Progress Notes (Signed)
Pharmacy Electrolyte Monitoring Consult:  Pharmacy consulted to assist in monitoring and replacing electrolytes in this 84 y.o. female admitted on 04/18/2018 with Fall   Labs:  Sodium (mmol/L)  Date Value  04/19/2018 137  06/02/2012 143   Potassium (mmol/L)  Date Value  04/19/2018 3.6  06/02/2012 3.4 (L)   Magnesium (mg/dL)  Date Value  04/19/2018 1.8   Phosphorus (mg/dL)  Date Value  04/19/2018 2.4 (L)   Calcium (mg/dL)  Date Value  04/19/2018 8.5 (L)   Calcium, Total (mg/dL)  Date Value  06/02/2012 9.2   Albumin (g/dL)  Date Value  04/18/2018 4.0  06/02/2012 3.9    Assessment/Plan: Patient received potassium 76mEq PO Q4hr x 2 doses. Will order potassium 80mEq PO x 1 and magnesium 2g IV x 1.   Will replace for goal potassium ~4 and goal magnesium ~2.   Will obtain follow up electrolytes with am labs.   Pharmacy will continue to monitor and adjust per consult.   Tynell Winchell L 04/19/2018 4:06 PM

## 2018-04-19 NOTE — Progress Notes (Signed)
[facility] Encounter Note  Patient: Destiny Oliver / Admit Date: 04/18/2018 / Date of Encounter: 04/19/2018, 2:09 PM   Subjective: Patient slightly improved with less shortness of breath.  Heart rate improved with amiodarone diltiazem at about of 110 bpm.  No evidence of myocardial infarction although elevated troponin most consistent with demand ischemia.  No current evidence of congestive heart failure.  Ambulating at this point  Review of Systems: Positive for: Weakness Negative for: Vision change, hearing change, syncope, dizziness, nausea, vomiting,diarrhea, bloody stool, stomach pain, cough, congestion, diaphoresis, urinary frequency, urinary pain,skin lesions, skin rashes Others previously listed  Objective: Telemetry: Atrial fibrillation with rapid ventricular rate Physical Exam: Blood pressure 117/69, pulse (!) 124, temperature 97.7 F (36.5 C), temperature source Oral, resp. rate (!) 23, height 5\' 1"  (1.549 m), weight 86.8 kg, SpO2 95 %. Body mass index is 36.16 kg/m. General: Well developed, well nourished, in no acute distress. Head: Normocephalic, atraumatic, sclera non-icteric, no xanthomas, nares are without discharge. Neck: No apparent masses Lungs: Normal respirations with no wheezes, no rhonchi, no rales , few crackles   Heart: Irregular rate and rhythm, normal S1 S2, no murmur, no rub, no gallop, PMI is normal size and placement, carotid upstroke normal without bruit, jugular venous pressure normal Abdomen: Soft, non-tender, non-distended with normoactive bowel sounds. No hepatosplenomegaly. Abdominal aorta is normal size without bruit Extremities: Trace edema, no clubbing, no cyanosis, no ulcers,  Peripheral: 2+ radial, 2+ femoral, 2+ dorsal pedal pulses Neuro: Alert and oriented. Moves all extremities spontaneously. Psych:  Responds to questions appropriately with a normal affect.   Intake/Output Summary (Last 24 hours) at 04/19/2018  1409 Last data filed at 04/19/2018 1000 Gross per 24 hour  Intake 1667.97 ml  Output 1 ml  Net 1666.97 ml    Inpatient Medications:  . amiodarone  150 mg Intravenous Once  . aspirin EC  81 mg Oral Daily  . cholecalciferol  1,000 Units Oral Daily  . diltiazem  240 mg Oral Daily  . docusate sodium  100 mg Oral BID  . ferrous sulfate  325 mg Oral Daily  . hydrALAZINE  100 mg Oral Daily  . lisinopril  20 mg Oral BID   And  . hydrochlorothiazide  12.5 mg Oral BID  . metoprolol  100 mg Oral BID  . multivitamin with minerals  1 tablet Oral Daily  . pravastatin  40 mg Oral QODAY  . cyanocobalamin  1,000 mcg Oral Daily   Infusions:  . sodium chloride 75 mL/hr at 04/19/18 1000  . amiodarone 30 mg/hr (04/19/18 1000)  . heparin 1,100 Units/hr (04/19/18 1000)    Labs: Recent Labs    04/18/18 1128 04/19/18 0527  NA 142 137  K 3.2* 3.6  CL 106 109  CO2 20* 21*  GLUCOSE 129* 135*  BUN 38* 39*  CREATININE 1.21* 1.23*  CALCIUM 9.6 8.5*  MG 2.0 1.8  PHOS  --  2.4*   Recent Labs    04/18/18 1128  AST 119*  ALT 77*  ALKPHOS 75  BILITOT 1.4*  PROT 7.2  ALBUMIN 4.0   Recent Labs    04/18/18 1128 04/18/18 1830 04/19/18 0527  WBC 11.4*  --  10.3  NEUTROABS 9.1*  --   --   HGB 15.1* 13.5 12.4  HCT 47.1*  --  38.7  MCV 88.9  --  88.4  PLT 280  --  220   Recent Labs    04/18/18 1128 04/18/18 1830 04/18/18 2327  04/19/18 0527  CKTOTAL 1,767*  --   --  474*  TROPONINI 0.20* 0.25* 0.24* 0.16*   Invalid input(s): POCBNP No results for input(s): HGBA1C in the last 72 hours.   Weights: Filed Weights   04/18/18 1121 04/19/18 0907  Weight: 86.2 kg 86.8 kg     Radiology/Studies:  Ct Head Wo Contrast  Result Date: 04/18/2018 CLINICAL DATA:  Head trauma EXAM: CT HEAD WITHOUT CONTRAST CT MAXILLOFACIAL WITHOUT CONTRAST TECHNIQUE: Multidetector CT imaging of the head and maxillofacial structures were performed using the standard protocol without intravenous contrast.  Multiplanar CT image reconstructions of the maxillofacial structures were also generated. COMPARISON:  CT head 07/12/2014, MRI 01/19/2007 FINDINGS: CT HEAD FINDINGS Brain: Mild to moderate atrophy. Hypodensity in the cerebral white matter bilaterally consistent with chronic microvascular ischemia. 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 Adjacent developmental venous anomaly also present. Negative for acute infarct, hemorrhage, mass.  No midline shift. Vascular: Atherosclerotic calcification. Negative for hyperdense vessel Skull: Negative Other: None CT MAXILLOFACIAL FINDINGS Osseous: Negative for facial fracture Orbits: Bilateral cataract surgery. Scleral band on the left. No orbital mass. Sinuses: Large retention cyst right maxillary sinus. No air-fluid level. Soft tissues: No soft tissue mass or swelling. IMPRESSION: 1. No acute intracranial abnormality 2. Negative for facial fracture. Electronically Signed   By: Franchot Gallo M.D.   On: 04/18/2018 12:52   Dg Hip Unilat With Pelvis 2-3 Views Left  Result Date: 04/18/2018 CLINICAL DATA:  Right hip pain after unwitnessed fall 4 days ago. EXAM: DG HIP (WITH OR WITHOUT PELVIS) 2-3V LEFT COMPARISON:  None. FINDINGS: There is no evidence of hip fracture or dislocation. There is no evidence of arthropathy or other focal bone abnormality. IMPRESSION: Negative. Electronically Signed   By: Marijo Conception, M.D.   On: 04/18/2018 12:40   Dg Hip Unilat  With Pelvis 2-3 Views Right  Result Date: 04/18/2018 CLINICAL DATA:  Unwitnessed fall 4 days ago.  Pain. EXAM: DG HIP (WITH OR WITHOUT PELVIS) 2-3V RIGHT COMPARISON:  None. FINDINGS: There is no evidence of hip fracture or dislocation. There is no evidence of arthropathy or other focal bone abnormality. IMPRESSION: Negative. Electronically Signed   By: Dorise Bullion III M.D   On: 04/18/2018 12:39   Ct Maxillofacial Wo Contrast  Result Date:  04/18/2018 CLINICAL DATA:  Head trauma EXAM: CT HEAD WITHOUT CONTRAST CT MAXILLOFACIAL WITHOUT CONTRAST TECHNIQUE: Multidetector CT imaging of the head and maxillofacial structures were performed using the standard protocol without intravenous contrast. Multiplanar CT image reconstructions of the maxillofacial structures were also generated. COMPARISON:  CT head 07/12/2014, MRI 01/19/2007 FINDINGS: CT HEAD FINDINGS Brain: Mild to moderate atrophy. Hypodensity in the cerebral white matter bilaterally consistent with chronic microvascular ischemia. 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 Adjacent developmental venous anomaly also present. Negative for acute infarct, hemorrhage, mass.  No midline shift. Vascular: Atherosclerotic calcification. Negative for hyperdense vessel Skull: Negative Other: None CT MAXILLOFACIAL FINDINGS Osseous: Negative for facial fracture Orbits: Bilateral cataract surgery. Scleral band on the left. No orbital mass. Sinuses: Large retention cyst right maxillary sinus. No air-fluid level. Soft tissues: No soft tissue mass or swelling. IMPRESSION: 1. No acute intracranial abnormality 2. Negative for facial fracture. Electronically Signed   By: Franchot Gallo M.D.   On: 04/18/2018 12:52     Assessment and Recommendation  84 y.o. female with known essential hypertension mixed hyperlipidemia  chronic kidney disease stage III with acute fall and rhabdomyolysis now slightly improved at this time without evidence of myocardial infarction having continued atrial fibrillation with rapid ventricular rate and no evidence of heart failure 1.  Continue heart rate control with amiodarone and diltiazem although changing diltiazem to oral at this time along with metoprolol.  Would continue amiodarone and changed to oral for possible faster cardioversion to normal rhythm after recovery from fall and injuries 2.  Anticoagulation for  further risk reduction in stroke with atrial fibrillation 3.  Echocardiogram pending 4.  Continue supportive care of fall and rhabdomyolysis  Signed, Serafina Royals M.D. FACC

## 2018-04-19 NOTE — Consult Note (Signed)
ANTICOAGULATION CONSULT NOTE - Follow Up Consult  Pharmacy Consult for Heparin infusion Indication: atrial fibrillation  Patient Measurements: Height: 5\' 1"  (154.9 cm) Weight: 191 lb 5.8 oz (86.8 kg) IBW/kg (Calculated) : 47.8 Heparin Dosing Weight: 67.7  Vital Signs: Temp: 97.7 F (36.5 C) (12/20 0907) Temp Source: Oral (12/20 0907) BP: 117/69 (12/20 1000) Pulse Rate: 124 (12/20 1000)  Labs: Recent Labs    04/18/18 1128 04/18/18 1830 04/18/18 2327 04/19/18 0237 04/19/18 0527 04/19/18 1033  HGB 15.1* 13.5  --   --  12.4  --   HCT 47.1*  --   --   --  38.7  --   PLT 280  --   --   --  220  --   APTT  --  28  --   --   --   --   LABPROT  --  14.3  --   --   --   --   INR  --  1.12  --   --   --   --   HEPARINUNFRC  --   --   --  0.25*  --  0.32  CREATININE 1.21*  --   --   --  1.23*  --   CKTOTAL 1,767*  --   --   --  474*  --   TROPONINI 0.20* 0.25* 0.24*  --  0.16*  --     Estimated Creatinine Clearance: 31 mL/min (A) (by C-G formula based on SCr of 1.23 mg/dL (H)).   Medications:  Medications Prior to Admission  Medication Sig Dispense Refill Last Dose  . aspirin EC 81 MG tablet Take 81 mg by mouth daily.   04/13/2018 at 0800  . cholecalciferol (VITAMIN D3) 25 MCG (1000 UT) tablet Take 1,000 Units by mouth daily.   04/13/2018 at 0800  . cyanocobalamin ([employer] VITAMIN B12) 2000 MCG tablet Take 1,000 mcg by mouth daily.   04/13/2018 at 0800  . ferrous sulfate 325 (65 FE) MG tablet Take 325 mg by mouth daily.   04/13/2018 at 0800  . hydrALAZINE (APRESOLINE) 50 MG tablet Take 100 mg by mouth daily.   04/13/2018 at 0800  . lisinopril-hydrochlorothiazide (PRINZIDE,ZESTORETIC) 20-12.5 MG tablet Take 1 tablet by mouth 2 (two) times daily.   04/13/2018 at 2000  . metoprolol (TOPROL-XL) 200 MG 24 hr tablet Take 100 mg by mouth 2 (two) times daily.   04/13/2018 at 2000  . Multiple Vitamins-Minerals (HAIR SKIN AND NAILS FORMULA) TABS Take 1 tablet by mouth daily.   04/13/2018 at  0800  . pravastatin (PRAVACHOL) 40 MG tablet Take 40 mg by mouth every other day.   Past Week at Unknown time  . verapamil (CALAN-SR) 240 MG CR tablet Take 240 mg by mouth daily.   04/13/2018 at 2000    Assessment: 84 y.o. female with a known history of hypertension, history of breast cancer status post right breast mastectomy, tachycardia presents to hospital secondary to fall.  The patient also had atrial fibrillation with rapid ventricular rate for which currently she is not have any heart failure or myocardial infarction or chest pain.  Per cardiology patient recommended to be started on heparin drip. Hgb, PLT down slightly, will follow  Heparin Course: 12/19 PM: 1000 units/hr with no bolus 12/20 0230 HL 0.25: rate increased to 1100 units/hr  12/20 1033 HL 0.32 12/20 1814 HL 0.33  Goal of Therapy:  Heparin level 0.3-0.7 units/ml Monitor platelets by anticoagulation protocol: Yes   Plan:  Continue heparin infusion at 1100 units/hr This is the second consecutive therapeutic level, therefore we will make the next level with tomorrow am labs. CBC daily while on heparin. Continue to monitor H&H and platelets  Dallie Piles, PharmD Clinical Pharmacist 04/19/2018,3:35 PM

## 2018-04-19 NOTE — Care Management Note (Signed)
Case Management Note  Patient Details  Name: Destiny Oliver MRN: [id number] Date of Birth: 12-03-1926  Subjective/Objective:  Patient admitting diagnosis is Afib.  Patient fell Sunday and was unable to get up, when she missed her hair appointment on Thursday the beautician sent her husband over to her home and he found her on the floor.  Patient is currently in the ICU stepdown status.  Family is at the bedside, stepson and his wife.  Patient reports that she lives alone in [place], she has a cane and walker, she mostly uses the cane to walk.  Patient has been independent in ADL's and drives herself.  After this fall at home the patient's stepson would like for her to live with him, at least temporarily until he knows she is safe.  RNCM will follow to assess for discharge needs, maybe home health services? Doran Clay RN BSN [phone]                   Action/Plan:   Expected Discharge Date:                  Expected Discharge Plan:     In-House Referral:     Discharge planning Services     Post Acute Care Choice:    Choice offered to:     DME Arranged:    DME Agency:     HH Arranged:    HH Agency:     Status of Service:     If discussed at [place] of Stay Meetings, dates discussed:    Additional Comments:  Shelbie Hutching, RN 04/19/2018, 12:35 PM

## 2018-04-19 NOTE — ED Notes (Addendum)
ED TO INPATIENT HANDOFF REPORT  ED Nurse Name and Phone #:  Levada Dy- [phone]  Name/Age/Gender Destiny Oliver 84 y.o. female Room/Bed: ED10A/ED10A  Code Status   Code Status: Full Code  Home/SNF/Other Discharge destination:  Home    Triage Complete: Triage complete  Chief Complaint fall  Triage Note PT arrives via ems from home. Pt fell Sunday and has not been able to get up since. Pt had a hair appt today and when she didn't show up, beautician sent her husband to check on pt. Pt heard the beautician's husband and yelled for help. Pt states she was in the kitchen when she fell and was close enough to pull herself to get water and food. Pt arrives saturated in urine with redness to peri area and right hip. Pt reports pain to her right hip, no shortening or rotation visualized. Pt a & o x 4. Pt's right eye red. Pt states she still has her contacts in.Multi colored bruising noted to pt's face.    Allergies Allergies  Allergen Reactions  . Penicillins Swelling    Level of Care/Admitting Diagnosis ED Disposition    ED Disposition Condition [facility]: [place] [[id number]]  Level of Care: Stepdown [14]  Diagnosis: A-fib [facility]) [[id number]]  Admitting Physician: Gladstone Lighter [[id number]]  Attending Physician: Gladstone Lighter [[id number]]  Estimated length of stay: past midnight tomorrow  Certification:: I certify this patient will need inpatient services for at least 2 midnights  PT Class (Do Not Modify): Inpatient [101]  PT Acc Code (Do Not Modify): Private [1]       Medical/Surgery History Past Medical History:  Diagnosis Date  . Breast cancer ([place]) 1994   right breast  . CKD (chronic kidney disease)   . Hypertension   . Tachycardia    Past Surgical History:  Procedure Laterality Date  . MASTECTOMY Right 1994     IV Location/Drains/Wounds Patient Lines/Drains/Airways Status   Active Line/Drains/Airways    Name:   Placement  date:   Placement time:   Site:   Days:   Peripheral IV 04/18/18 Right Antecubital   04/18/18    1125    Antecubital   1   Peripheral IV 04/18/18 Left Arm   04/18/18    1829    Arm   1          Intake/Output Last 24 hours  Intake/Output Summary (Last 24 hours) at 04/19/2018 0800 Last data filed at 04/19/2018 0758 Gross per 24 hour  Intake -  Output 1 ml  Net -1 ml    Labs/Imaging Results for orders placed or performed during the hospital encounter of 04/18/18 (from the past 48 hour(s))  CBC with Differential     Status: Abnormal   Collection Time: 04/18/18 11:28 AM  Result Value Ref Range   WBC 11.4 (H) 4.0 - 10.5 K/uL   RBC 5.30 (H) 3.87 - 5.11 MIL/uL   Hemoglobin 15.1 (H) 12.0 - 15.0 g/dL   HCT 47.1 (H) 36.0 - 46.0 %   MCV 88.9 80.0 - 100.0 fL   MCH 28.5 26.0 - 34.0 pg   MCHC 32.1 30.0 - 36.0 g/dL   RDW 13.9 11.5 - 15.5 %   Platelets 280 150 - 400 K/uL   nRBC 0.0 0.0 - 0.2 %   Neutrophils Relative % 80 %   Neutro Abs 9.1 (H) 1.7 - 7.7 K/uL   Lymphocytes Relative 11 %   Lymphs Abs 1.2 0.7 -  4.0 K/uL   Monocytes Relative 8 %   Monocytes Absolute 1.0 0.1 - 1.0 K/uL   Eosinophils Relative 1 %   Eosinophils Absolute 0.1 0.0 - 0.5 K/uL   Basophils Relative 0 %   Basophils Absolute 0.0 0.0 - 0.1 K/uL   Immature Granulocytes 0 %   Abs Immature Granulocytes 0.04 0.00 - 0.07 K/uL    Comment: Performed at [facility], [address] [place], [place] [address]  Troponin I - ONCE - STAT     Status: Abnormal   Collection Time: 04/18/18 11:28 AM  Result Value Ref Range   Troponin I 0.20 (HH) <0.03 ng/mL    Comment: CRITICAL RESULT CALLED TO, READ BACK BY AND VERIFIED WITH LAURA CATES @1207  04/18/18 BY FMW Performed at [facility], [address] [place], [place] [address]   CK     Status: Abnormal   Collection Time: 04/18/18 11:28 AM  Result Value Ref Range   Total CK 1,767 (H) 38 - 234 U/L    Comment: Performed at [facility], [address] [place], [place] [address]  Comprehensive metabolic panel     Status: Abnormal   Collection Time: 04/18/18 11:28 AM  Result Value Ref Range   Sodium 142 135 - 145 mmol/L   Potassium 3.2 (L) 3.5 - 5.1 mmol/L   Chloride 106 98 - 111 mmol/L   CO2 20 (L) 22 - 32 mmol/L   Glucose, Bld 129 (H) 70 - 99 mg/dL   BUN 38 (H) 8 - 23 mg/dL   Creatinine, Ser 1.21 (H) 0.44 - 1.00 mg/dL   Calcium 9.6 8.9 - 10.3 mg/dL   Total Protein 7.2 6.5 - 8.1 g/dL   Albumin 4.0 3.5 - 5.0 g/dL   AST 119 (H) 15 - 41 U/L   ALT 77 (H) 0 - 44 U/L   Alkaline Phosphatase 75 38 - 126 U/L   Total Bilirubin 1.4 (H) 0.3 - 1.2 mg/dL   GFR calc non Af Amer 40 (L) >60 mL/min   GFR calc Af Amer 46 (L) >60 mL/min   Anion gap 16 (H) 5 - 15    Comment: Performed at [facility], [address], [place], [place] [address]  Magnesium     Status: None   Collection Time: 04/18/18 11:28 AM  Result Value Ref Range   Magnesium 2.0 1.7 - 2.4 mg/dL    Comment: Performed at [facility], [address] [place], [place] [address]  Troponin I - Now Then Q6H     Status: Abnormal   Collection Time: 04/18/18  6:30 PM  Result Value Ref Range   Troponin I 0.25 (HH) <0.03 ng/mL    Comment: CRITICAL VALUE NOTED. VALUE IS CONSISTENT WITH PREVIOUSLY REPORTED/CALLED VALUE / [place] Performed at [facility], [address] [place], [place] [address]   TSH     Status: None   Collection Time: 04/18/18  6:30 PM  Result Value Ref Range   TSH 1.173 0.350 - 4.500 uIU/mL    Comment: Performed by a 3rd Generation assay with a functional sensitivity of <=0.01 uIU/mL. Performed at [facility], [address] [place], [place] [address]   Hemoglobin     Status: None   Collection Time: 04/18/18  6:30 PM  Result Value Ref Range   Hemoglobin 13.5 12.0 - 15.0 g/dL    Comment: Performed at [facility], [address], [place],  [address]  Protime-INR  Status: None   Collection Time:  04/18/18  6:30 PM  Result Value Ref Range   Prothrombin Time 14.3 11.4 - 15.2 seconds   INR 1.12     Comment: Performed at [facility], [address] [place], [place] [address]  APTT     Status: None   Collection Time: 04/18/18  6:30 PM  Result Value Ref Range   aPTT 28 24 - 36 seconds    Comment: Performed at [facility], [address] [place], [place] [address]  Troponin I - Now Then Q6H     Status: Abnormal   Collection Time: 04/18/18 11:27 PM  Result Value Ref Range   Troponin I 0.24 (HH) <0.03 ng/mL    Comment: CRITICAL VALUE NOTED. VALUE IS CONSISTENT WITH PREVIOUSLY REPORTED/CALLED VALUE AKT Performed at [facility], [place], [place] [address]   Heparin level (unfractionated)     Status: Abnormal   Collection Time: 04/19/18  2:37 AM  Result Value Ref Range   Heparin Unfractionated 0.25 (L) 0.30 - 0.70 IU/mL    Comment: (NOTE) If heparin results are below expected values, and patient dosage has  been confirmed, suggest follow up testing of antithrombin III levels. Performed at [facility], [address] [place], [place] [address]   Troponin I - Now Then Q6H     Status: Abnormal   Collection Time: 04/19/18  5:27 AM  Result Value Ref Range   Troponin I 0.16 (HH) <0.03 ng/mL    Comment: CRITICAL VALUE NOTED. VALUE IS CONSISTENT WITH PREVIOUSLY REPORTED/CALLED VALUE  [facility] Performed at [facility], [address] [place], [place] [address]   Basic metabolic panel     Status: Abnormal   Collection Time: 04/19/18  5:27 AM  Result Value Ref Range   Sodium 137 135 - 145 mmol/L   Potassium 3.6 3.5 - 5.1 mmol/L   Chloride 109 98 - 111 mmol/L   CO2 21 (L) 22 - 32 mmol/L   Glucose, Bld 135 (H) 70 - 99 mg/dL   BUN 39 (H) 8 - 23 mg/dL   Creatinine, Ser 1.23 (H) 0.44 - 1.00 mg/dL   Calcium 8.5 (L) 8.9 - 10.3 mg/dL   GFR calc non Af Amer 39 (L) >60 mL/min   GFR calc Af Amer 45 (L) >60 mL/min   Anion gap 7  5 - 15    Comment: Performed at [facility], [address] [place], [place] [address]  CBC     Status: None   Collection Time: 04/19/18  5:27 AM  Result Value Ref Range   WBC 10.3 4.0 - 10.5 K/uL   RBC 4.38 3.87 - 5.11 MIL/uL   Hemoglobin 12.4 12.0 - 15.0 g/dL   HCT 38.7 36.0 - 46.0 %   MCV 88.4 80.0 - 100.0 fL   MCH 28.3 26.0 - 34.0 pg   MCHC 32.0 30.0 - 36.0 g/dL   RDW 14.2 11.5 - 15.5 %   Platelets 220 150 - 400 K/uL   nRBC 0.0 0.0 - 0.2 %    Comment: Performed at [facility], [address] [place], [place] [address]  CK     Status: Abnormal   Collection Time: 04/19/18  5:27 AM  Result Value Ref Range   Total CK 474 (H) 38 - 234 U/L    Comment: Performed at [facility], [address] [place], [place] [address]   Ct Head Wo Contrast  Result  Date: 04/18/2018 CLINICAL DATA:  Head trauma EXAM: CT HEAD WITHOUT CONTRAST CT MAXILLOFACIAL WITHOUT CONTRAST TECHNIQUE: Multidetector CT imaging of the head and maxillofacial structures were performed using the standard protocol without intravenous contrast. Multiplanar CT image reconstructions of the maxillofacial structures were also generated. COMPARISON:  CT head 07/12/2014, MRI 01/19/2007 FINDINGS: CT HEAD FINDINGS Brain: Mild to moderate atrophy. Hypodensity in the cerebral white matter bilaterally consistent with chronic microvascular ischemia. 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 Adjacent developmental venous anomaly also present. Negative for acute infarct, hemorrhage, mass.  No midline shift. Vascular: Atherosclerotic calcification. Negative for hyperdense vessel Skull: Negative Other: None CT MAXILLOFACIAL FINDINGS Osseous: Negative for facial fracture Orbits: Bilateral cataract surgery. Scleral band on the left. No orbital mass. Sinuses: Large retention cyst right maxillary sinus. No air-fluid level. Soft tissues: No soft tissue  mass or swelling. IMPRESSION: 1. No acute intracranial abnormality 2. Negative for facial fracture. Electronically Signed   By: Franchot Gallo M.D.   On: 04/18/2018 12:52   Dg Hip Unilat With Pelvis 2-3 Views Left  Result Date: 04/18/2018 CLINICAL DATA:  Right hip pain after unwitnessed fall 4 days ago. EXAM: DG HIP (WITH OR WITHOUT PELVIS) 2-3V LEFT COMPARISON:  None. FINDINGS: There is no evidence of hip fracture or dislocation. There is no evidence of arthropathy or other focal bone abnormality. IMPRESSION: Negative. Electronically Signed   By: Marijo Conception, M.D.   On: 04/18/2018 12:40   Dg Hip Unilat  With Pelvis 2-3 Views Right  Result Date: 04/18/2018 CLINICAL DATA:  Unwitnessed fall 4 days ago.  Pain. EXAM: DG HIP (WITH OR WITHOUT PELVIS) 2-3V RIGHT COMPARISON:  None. FINDINGS: There is no evidence of hip fracture or dislocation. There is no evidence of arthropathy or other focal bone abnormality. IMPRESSION: Negative. Electronically Signed   By: Dorise Bullion III M.D   On: 04/18/2018 12:39   Ct Maxillofacial Wo Contrast  Result Date: 04/18/2018 CLINICAL DATA:  Head trauma EXAM: CT HEAD WITHOUT CONTRAST CT MAXILLOFACIAL WITHOUT CONTRAST TECHNIQUE: Multidetector CT imaging of the head and maxillofacial structures were performed using the standard protocol without intravenous contrast. Multiplanar CT image reconstructions of the maxillofacial structures were also generated. COMPARISON:  CT head 07/12/2014, MRI 01/19/2007 FINDINGS: CT HEAD FINDINGS Brain: Mild to moderate atrophy. Hypodensity in the cerebral white matter bilaterally consistent with chronic microvascular ischemia. 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 Adjacent developmental venous anomaly also present. Negative for acute infarct, hemorrhage, mass.  No midline shift. Vascular: Atherosclerotic calcification. Negative for hyperdense vessel Skull: Negative  Other: None CT MAXILLOFACIAL FINDINGS Osseous: Negative for facial fracture Orbits: Bilateral cataract surgery. Scleral band on the left. No orbital mass. Sinuses: Large retention cyst right maxillary sinus. No air-fluid level. Soft tissues: No soft tissue mass or swelling. IMPRESSION: 1. No acute intracranial abnormality 2. Negative for facial fracture. Electronically Signed   By: Franchot Gallo M.D.   On: 04/18/2018 12:52    Pending Labs Unresulted Labs (From admission, onward)    Start     Ordered   04/19/18 1000  Heparin level (unfractionated)  Once-Timed,   STAT     04/19/18 0318          Vitals/Pain Today's Vitals   04/19/18 0515 04/19/18 0530 04/19/18 0545 04/19/18 0730  BP: 121/79  123/69 118/76  Pulse: (!) 108 (!) 116 (!) 121 (!) 119  Resp: 17  19 11 (!) 24  Temp:      TempSrc:      SpO2: 96% 97% 95% 93%  Weight:      Height:      PainSc:        Isolation Precautions No active isolations  Medications Medications  diltiazem (CARDIZEM) 100 mg in dextrose 5% 161mL (1 mg/mL) infusion (15 mg/hr Intravenous New Bag/Given 04/19/18 0738)  aspirin EC tablet 81 mg (81 mg Oral Given 04/18/18 1750)  hydrALAZINE (APRESOLINE) tablet 100 mg (100 mg Oral Refused 04/18/18 1750)  metoprolol succinate (TOPROL-XL) 24 hr tablet 100 mg (100 mg Oral Given 04/18/18 2211)  verapamil (CALAN-SR) CR tablet 240 mg (240 mg Oral Not Given 04/18/18 2240)  pravastatin (PRAVACHOL) tablet 40 mg (40 mg Oral Given 04/19/18 0236)  vitamin B-12 (CYANOCOBALAMIN) tablet 1,000 mcg (1,000 mcg Oral Not Given 04/18/18 2240)  ferrous sulfate tablet 325 mg (has no administration in time range)  cholecalciferol (VITAMIN D3) tablet 1,000 Units (1,000 Units Oral Given 04/18/18 1750)  multivitamin with minerals tablet 1 tablet (1 tablet Oral Given 04/18/18 2240)  0.9 %  sodium chloride infusion ( Intravenous New Bag/Given 04/19/18 0734)  acetaminophen (TYLENOL) tablet 650 mg (has no administration in time range)     Or  acetaminophen (TYLENOL) suppository 650 mg (has no administration in time range)  traMADol (ULTRAM) tablet 50 mg (has no administration in time range)  docusate sodium (COLACE) capsule 100 mg (100 mg Oral Given 04/18/18 2212)  ondansetron (ZOFRAN) tablet 4 mg (has no administration in time range)    Or  ondansetron (ZOFRAN) injection 4 mg (has no administration in time range)  polyethylene glycol (MIRALAX / GLYCOLAX) packet 17 g (has no administration in time range)  heparin ADULT infusion 100 units/mL (25000 units/275mL sodium chloride 0.45%) (1,100 Units/hr Intravenous Rate/Dose Change 04/19/18 0327)  amiodarone (NEXTERONE) 1.8 mg/mL load via infusion 150 mg ( Intravenous Canceled Entry 04/18/18 1950)    Followed by  amiodarone (NEXTERONE PREMIX) 360-4.14 MG/200ML-% (1.8 mg/mL) IV infusion (0 mg/hr Intravenous Stopped 04/19/18 0157)    Followed by  amiodarone (NEXTERONE PREMIX) 360-4.14 MG/200ML-% (1.8 mg/mL) IV infusion (30 mg/hr Intravenous New Bag/Given 04/19/18 0158)  lisinopril (PRINIVIL,ZESTRIL) tablet 20 mg (20 mg Oral Given 04/18/18 2319)    And  hydrochlorothiazide (MICROZIDE) capsule 12.5 mg (12.5 mg Oral Given 04/18/18 2319)  0.9 %  sodium chloride infusion (0 mLs Intravenous Stopped 04/18/18 1603)  diltiazem (CARDIZEM) injection 15 mg (15 mg Intravenous Given 04/18/18 1259)  potassium chloride SA (K-DUR,KLOR-CON) CR tablet 40 mEq (40 mEq Oral Given 04/18/18 1850)  digoxin (LANOXIN) 0.25 MG/ML injection 0.25 mg (0.25 mg Intravenous Given 04/18/18 1655)  metoprolol tartrate (LOPRESSOR) injection 5 mg (5 mg Intravenous Given 04/18/18 1751)  sodium chloride 0.9 % bolus 250 mL (0 mLs Intravenous Stopped 04/18/18 2210)  amiodarone (NEXTERONE) 150-4.21 MG/100ML-% bolus (0 mg  Stopped 04/18/18 2005)    Mobility walks with device High fall risk   Focused Assessments Cardiac    Recommendations: See Admitting Provider Note  Report given to: Hiral, RN [phone])  Additional  Notes:  Attempted to call report at 0800, CCU RN not available at this time, will call me back

## 2018-04-19 NOTE — Consult Note (Signed)
[place] for Heparin infusion Indication: atrial fibrillation  Allergies  Allergen Reactions  . Penicillins Swelling    Patient Measurements: Height: 5\' 1"  (154.9 cm) Weight: 191 lb 5.8 oz (86.8 kg) IBW/kg (Calculated) : 47.8 Heparin Dosing Weight: 67.7  Vital Signs: Temp: 97.7 F (36.5 C) (12/20 0907) Temp Source: Oral (12/20 0907) BP: 117/69 (12/20 1000) Pulse Rate: 124 (12/20 1000)  Labs: Recent Labs    04/18/18 1128 04/18/18 1830 04/18/18 2327 04/19/18 0237 04/19/18 0527 04/19/18 1033  HGB 15.1* 13.5  --   --  12.4  --   HCT 47.1*  --   --   --  38.7  --   PLT 280  --   --   --  220  --   APTT  --  28  --   --   --   --   LABPROT  --  14.3  --   --   --   --   INR  --  1.12  --   --   --   --   HEPARINUNFRC  --   --   --  0.25*  --  0.32  CREATININE 1.21*  --   --   --  1.23*  --   CKTOTAL 1,767*  --   --   --  474*  --   TROPONINI 0.20* 0.25* 0.24*  --  0.16*  --     Estimated Creatinine Clearance: 31 mL/min (A) (by C-G formula based on SCr of 1.23 mg/dL (H)).   Medications:  Medications Prior to Admission  Medication Sig Dispense Refill Last Dose  . aspirin EC 81 MG tablet Take 81 mg by mouth daily.   04/13/2018 at 0800  . cholecalciferol (VITAMIN D3) 25 MCG (1000 UT) tablet Take 1,000 Units by mouth daily.   04/13/2018 at 0800  . cyanocobalamin ([employer] VITAMIN B12) 2000 MCG tablet Take 1,000 mcg by mouth daily.   04/13/2018 at 0800  . ferrous sulfate 325 (65 FE) MG tablet Take 325 mg by mouth daily.   04/13/2018 at 0800  . hydrALAZINE (APRESOLINE) 50 MG tablet Take 100 mg by mouth daily.   04/13/2018 at 0800  . lisinopril-hydrochlorothiazide (PRINZIDE,ZESTORETIC) 20-12.5 MG tablet Take 1 tablet by mouth 2 (two) times daily.   04/13/2018 at 2000  . metoprolol (TOPROL-XL) 200 MG 24 hr tablet Take 100 mg by mouth 2 (two) times daily.   04/13/2018 at 2000  . Multiple Vitamins-Minerals (HAIR SKIN AND NAILS FORMULA) TABS  Take 1 tablet by mouth daily.   04/13/2018 at 0800  . pravastatin (PRAVACHOL) 40 MG tablet Take 40 mg by mouth every other day.   Past Week at Unknown time  . verapamil (CALAN-SR) 240 MG CR tablet Take 240 mg by mouth daily.   04/13/2018 at 2000    Assessment: 84 y.o. female with a known history of hypertension, history of breast cancer status post right breast mastectomy, tachycardia presents to hospital secondary to fall.  The patient also had atrial fibrillation with rapid ventricular rate for which currently she is not have any heart failure or myocardial infarction or chest pain.  Per cardiology patient recommended to be started on heparin drip.  Goal of Therapy:  Heparin level 0.3-0.7 units/ml Monitor platelets by anticoagulation protocol: Yes   Plan:  Continue heparin drip at 1100 units/hr and obtain confirmatory heparin level at 1800.   Pharmacy will continue to monitor and adjust per consult.   Destiny Oliver  L 04/19/2018,4:08 PM

## 2018-04-19 NOTE — Progress Notes (Signed)
[facility] NAME: Destiny Oliver    MR#:  [id number]  DATE OF BIRTH:  Dec 25, 1926  SUBJECTIVE:   Family at bedside.  Heart rates are better controlled.  REVIEW OF SYSTEMS:    Review of Systems  Constitutional: Negative for fever, chills weight loss HENT: Negative for ear pain, nosebleeds, congestion, facial swelling, rhinorrhea, neck pain, neck stiffness and ear discharge.   Respiratory: Negative for cough, shortness of breath, wheezing  Cardiovascular: Negative for chest pain, palpitations and leg swelling.  Gastrointestinal: Negative for heartburn, abdominal pain, vomiting, diarrhea or consitpation Genitourinary: Negative for dysuria, urgency, frequency, hematuria Musculoskeletal: Negative for back pain or joint pain Neurological: Negative for dizziness, seizures, syncope, focal weakness,  numbness and headaches.  Hematological: Does not bruise/bleed easily.  Psychiatric/Behavioral: Negative for hallucinations, confusion, dysphoric mood    Tolerating Diet: yes      DRUG ALLERGIES:   Allergies  Allergen Reactions  . Penicillins Swelling    VITALS:  Blood pressure 117/69, pulse (!) 124, temperature 97.7 F (36.5 C), temperature source Oral, resp. rate (!) 23, height 5\' 1"  (1.549 m), weight 86.8 kg, SpO2 95 %.  PHYSICAL EXAMINATION:  Constitutional: Appears well-developed and well-nourished. No distress. HENT: Normocephalic. Marland Kitchen Oropharynx is clear and moist.  Eyes: Conjunctivae and EOM are normal. PERRLA, no scleral icterus.  Neck: Normal ROM. Neck supple. No JVD. No tracheal deviation. CVS: irr, irr tach, S1/S2 +, 2/6 murmurs, no gallops, no carotid bruit.  Pulmonary: Effort and breath sounds normal, no stridor, rhonchi, wheezes, rales.  Abdominal: Soft. BS +,  no distension, tenderness, rebound or guarding.  Musculoskeletal: Normal range of motion. No edema and no tenderness.  Neuro: Alert. CN 2-12 grossly intact. No  focal deficits. Skin: Skin is warm and dry. No rash noted. brusing from fall face Psychiatric: Normal mood and affect.      LABORATORY PANEL:   CBC Recent Labs  Lab 04/19/18 0527  WBC 10.3  HGB 12.4  HCT 38.7  PLT 220   ------------------------------------------------------------------------------------------------------------------  Chemistries  Recent Labs  Lab 04/18/18 1128 04/19/18 0527  NA 142 137  K 3.2* 3.6  CL 106 109  CO2 20* 21*  GLUCOSE 129* 135*  BUN 38* 39*  CREATININE 1.21* 1.23*  CALCIUM 9.6 8.5*  MG 2.0 1.8  AST 119*  --   ALT 77*  --   ALKPHOS 75  --   BILITOT 1.4*  --    ------------------------------------------------------------------------------------------------------------------  Cardiac Enzymes Recent Labs  Lab 04/18/18 1830 04/18/18 2327 04/19/18 0527  TROPONINI 0.25* 0.24* 0.16*   ------------------------------------------------------------------------------------------------------------------  RADIOLOGY:  Ct Head Wo Contrast  Result Date: 04/18/2018 CLINICAL DATA:  Head trauma EXAM: CT HEAD WITHOUT CONTRAST CT MAXILLOFACIAL WITHOUT CONTRAST TECHNIQUE: Multidetector CT imaging of the head and maxillofacial structures were performed using the standard protocol without intravenous contrast. Multiplanar CT image reconstructions of the maxillofacial structures were also generated. COMPARISON:  CT head 07/12/2014, MRI 01/19/2007 FINDINGS: CT HEAD FINDINGS Brain: Mild to moderate atrophy. Hypodensity in the cerebral white matter bilaterally consistent with chronic microvascular ischemia. 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 Adjacent developmental venous anomaly also present. Negative for acute infarct, hemorrhage, mass.  No midline shift. Vascular: Atherosclerotic calcification. Negative for hyperdense vessel Skull: Negative Other: None CT MAXILLOFACIAL FINDINGS  Osseous: Negative for facial fracture Orbits: Bilateral cataract surgery. Scleral band on the left. No orbital mass. Sinuses: Large retention cyst right maxillary  sinus. No air-fluid level. Soft tissues: No soft tissue mass or swelling. IMPRESSION: 1. No acute intracranial abnormality 2. Negative for facial fracture. Electronically Signed   By: Franchot Gallo M.D.   On: 04/18/2018 12:52   Dg Hip Unilat With Pelvis 2-3 Views Left  Result Date: 04/18/2018 CLINICAL DATA:  Right hip pain after unwitnessed fall 4 days ago. EXAM: DG HIP (WITH OR WITHOUT PELVIS) 2-3V LEFT COMPARISON:  None. FINDINGS: There is no evidence of hip fracture or dislocation. There is no evidence of arthropathy or other focal bone abnormality. IMPRESSION: Negative. Electronically Signed   By: Marijo Conception, M.D.   On: 04/18/2018 12:40   Dg Hip Unilat  With Pelvis 2-3 Views Right  Result Date: 04/18/2018 CLINICAL DATA:  Unwitnessed fall 4 days ago.  Pain. EXAM: DG HIP (WITH OR WITHOUT PELVIS) 2-3V RIGHT COMPARISON:  None. FINDINGS: There is no evidence of hip fracture or dislocation. There is no evidence of arthropathy or other focal bone abnormality. IMPRESSION: Negative. Electronically Signed   By: Dorise Bullion III M.D   On: 04/18/2018 12:39   Ct Maxillofacial Wo Contrast  Result Date: 04/18/2018 CLINICAL DATA:  Head trauma EXAM: CT HEAD WITHOUT CONTRAST CT MAXILLOFACIAL WITHOUT CONTRAST TECHNIQUE: Multidetector CT imaging of the head and maxillofacial structures were performed using the standard protocol without intravenous contrast. Multiplanar CT image reconstructions of the maxillofacial structures were also generated. COMPARISON:  CT head 07/12/2014, MRI 01/19/2007 FINDINGS: CT HEAD FINDINGS Brain: Mild to moderate atrophy. Hypodensity in the cerebral white matter bilaterally consistent with chronic microvascular ischemia. Ill-defined hyperdensity in the right anterior basal ganglia compatible with cavernoma as noted on  prior MRI. Adjacent developmental venous anomaly also present. Negative for acute infarct, hemorrhage, mass.  No midline shift. Vascular: Atherosclerotic calcification. Negative for hyperdense vessel Skull: Negative Other: None CT MAXILLOFACIAL FINDINGS Osseous: Negative for facial fracture Orbits: Bilateral cataract surgery. Scleral band on the left. No orbital mass. Sinuses: Large retention cyst right maxillary sinus. No air-fluid level. Soft tissues: No soft tissue mass or swelling. IMPRESSION: 1. No acute intracranial abnormality 2. Negative for facial fracture. Electronically Signed   By: Franchot Gallo M.D.   On: 04/18/2018 12:52     ASSESSMENT AND PLAN:   84 year old female with a history of breast cancer status post right breast mastectomy and hypertension who presented to the hospital after a fall.  1.  Acute rhabdomyolysis due to fall: CK has improved with IV fluids.  2.  Elevated troponin: Patient has ruled out for ACS.  This is due to demand ischemia  3.  Atrial fibrillation with RVR: Patient evaluated by cardiology. Patient is being positioned off of amiodarone drip Continue heparin drip and plan to transition to oral tomorrow Continue metoprolol for heart rate control in addition to oral diltiazem  4.  Essential hypertension: Continue lisinopril, HCTZ, metoprolol, diltiazem     Management plans discussed with the patient and family and they are in agreement.  CODE STATUS: FULL  TOTAL TIME TAKING CARE OF THIS PATIENT: 27 minutes.     POSSIBLE D/C 1-2 days, DEPENDING ON CLINICAL CONDITION.   Ellinore Merced M.D on 04/19/2018 at 12:46 PM  Between 7am to 6pm - Pager - [phone] After 6pm go to [email or web address] - password [email or web address] [facility]  Office  [phone]  CC: Primary care physician; Juluis Pitch, MD  Note: This dictation was prepared with Dragon dictation along with smaller phrase technology. Any transcriptional errors that result from  this  process are unintentional.

## 2018-04-20 ENCOUNTER — Other Ambulatory Visit: Payer: Self-pay

## 2018-04-20 ENCOUNTER — Inpatient Hospital Stay: Payer: Medicare Other

## 2018-04-20 LAB — CBC
HEMATOCRIT: 38.9 % (ref 36.0–46.0)
Hemoglobin: 12.2 g/dL (ref 12.0–15.0)
MCH: 28.4 pg (ref 26.0–34.0)
MCHC: 31.4 g/dL (ref 30.0–36.0)
MCV: 90.5 fL (ref 80.0–100.0)
Platelets: 219 10*3/uL (ref 150–400)
RBC: 4.3 MIL/uL (ref 3.87–5.11)
RDW: 14.5 % (ref 11.5–15.5)
WBC: 9.5 10*3/uL (ref 4.0–10.5)
nRBC: 0 % (ref 0.0–0.2)

## 2018-04-20 LAB — BASIC METABOLIC PANEL
Anion gap: 6 (ref 5–15)
BUN: 35 mg/dL — ABNORMAL HIGH (ref 8–23)
CO2: 20 mmol/L — ABNORMAL LOW (ref 22–32)
Calcium: 8.4 mg/dL — ABNORMAL LOW (ref 8.9–10.3)
Chloride: 111 mmol/L (ref 98–111)
Creatinine, Ser: 1.12 mg/dL — ABNORMAL HIGH (ref 0.44–1.00)
GFR calc Af Amer: 50 mL/min — ABNORMAL LOW (ref >60)
GFR calc non Af Amer: 44 mL/min — ABNORMAL LOW (ref >60)
GLUCOSE: 141 mg/dL — AB (ref 70–99)
Potassium: 4 mmol/L (ref 3.5–5.1)
Sodium: 137 mmol/L (ref 135–145)

## 2018-04-20 LAB — PHOSPHORUS: Phosphorus: 2.2 mg/dL — ABNORMAL LOW (ref 2.5–4.6)

## 2018-04-20 LAB — CK: Total CK: 307 U/L — ABNORMAL HIGH (ref 38–234)

## 2018-04-20 LAB — ECHOCARDIOGRAM COMPLETE
Height: 61 in
Weight: 3061.75 oz

## 2018-04-20 LAB — PROTIME-INR
INR: 1.1
PROTHROMBIN TIME: 14.1 s (ref 11.4–15.2)

## 2018-04-20 LAB — MAGNESIUM: Magnesium: 2.4 mg/dL (ref 1.7–2.4)

## 2018-04-20 LAB — HEPARIN LEVEL (UNFRACTIONATED): Heparin Unfractionated: 0.35 IU/mL (ref 0.30–0.70)

## 2018-04-20 MED ORDER — K PHOS MONO-SOD PHOS DI & MONO 155-852-130 MG PO TABS
500.0000 mg | ORAL_TABLET | ORAL | Status: DC
  Administered 2018-04-20 – 2018-04-21 (×3): 500 mg via ORAL
  Filled 2018-04-20 (×4): qty 2

## 2018-04-20 MED ORDER — MORPHINE SULFATE (PF) 2 MG/ML IV SOLN
2.0000 mg | Freq: Once | INTRAVENOUS | Status: AC
  Administered 2018-04-20: 2 mg via INTRAVENOUS
  Filled 2018-04-20: qty 1

## 2018-04-20 MED ORDER — APIXABAN 5 MG PO TABS
5.0000 mg | ORAL_TABLET | Freq: Two times a day (BID) | ORAL | Status: DC
  Administered 2018-04-20 (×2): 5 mg via ORAL
  Filled 2018-04-20 (×2): qty 1

## 2018-04-20 MED ORDER — SIMETHICONE 80 MG PO CHEW
80.0000 mg | CHEWABLE_TABLET | Freq: Four times a day (QID) | ORAL | Status: DC | PRN
  Administered 2018-04-20 – 2018-04-23 (×2): 80 mg via ORAL
  Filled 2018-04-20 (×3): qty 1

## 2018-04-20 MED ORDER — LIDOCAINE 5 % EX PTCH
1.0000 | MEDICATED_PATCH | CUTANEOUS | Status: DC
  Administered 2018-04-20 – 2018-04-30 (×11): 1 via TRANSDERMAL
  Filled 2018-04-20 (×11): qty 1

## 2018-04-20 MED ORDER — AMIODARONE HCL 200 MG PO TABS
400.0000 mg | ORAL_TABLET | Freq: Two times a day (BID) | ORAL | Status: DC
  Administered 2018-04-20 – 2018-04-29 (×19): 400 mg via ORAL
  Filled 2018-04-20 (×22): qty 2

## 2018-04-20 MED ORDER — MAGNESIUM CITRATE PO SOLN
0.5000 | Freq: Once | ORAL | Status: AC
  Administered 2018-04-20: 0.5 via ORAL
  Filled 2018-04-20: qty 296

## 2018-04-20 NOTE — Progress Notes (Signed)
Notified MD of pt wanting something for gas. Orders placed. Will continue to monitor and assess.

## 2018-04-20 NOTE — Consult Note (Signed)
[place] for Heparin infusion Indication: atrial fibrillation  Allergies  Allergen Reactions  . Penicillins Swelling    Patient Measurements: Height: 5\' 1"  (154.9 cm) Weight: 191 lb 5.8 oz (86.8 kg) IBW/kg (Calculated) : 47.8 Heparin Dosing Weight: 67.7  Vital Signs: Temp: 97.9 F (36.6 C) (12/21 0144) Temp Source: Oral (12/21 0144) BP: 156/112 (12/21 0500) Pulse Rate: 100 (12/21 0500)  Labs: Recent Labs    04/18/18 1128 04/18/18 1830 04/18/18 2327  04/19/18 0527 04/19/18 1033 04/19/18 1814 04/20/18 0347  HGB 15.1* 13.5  --   --  12.4  --   --  12.2  HCT 47.1*  --   --   --  38.7  --   --  38.9  PLT 280  --   --   --  220  --   --  219  APTT  --  28  --   --   --   --   --   --   LABPROT  --  14.3  --   --   --   --   --   --   INR  --  1.12  --   --   --   --   --   --   HEPARINUNFRC  --   --   --    < >  --  0.32 0.33 0.35  CREATININE 1.21*  --   --   --  1.23*  --   --  1.12*  CKTOTAL 1,767*  --   --   --  474*  --   --   --   TROPONINI 0.20* 0.25* 0.24*  --  0.16*  --   --   --    < > = values in this interval not displayed.    Estimated Creatinine Clearance: 34.1 mL/min (A) (by C-G formula based on SCr of 1.12 mg/dL (H)).   Medications:  Medications Prior to Admission  Medication Sig Dispense Refill Last Dose  . aspirin EC 81 MG tablet Take 81 mg by mouth daily.   04/13/2018 at 0800  . cholecalciferol (VITAMIN D3) 25 MCG (1000 UT) tablet Take 1,000 Units by mouth daily.   04/13/2018 at 0800  . cyanocobalamin ([employer] VITAMIN B12) 2000 MCG tablet Take 1,000 mcg by mouth daily.   04/13/2018 at 0800  . ferrous sulfate 325 (65 FE) MG tablet Take 325 mg by mouth daily.   04/13/2018 at 0800  . hydrALAZINE (APRESOLINE) 50 MG tablet Take 100 mg by mouth daily.   04/13/2018 at 0800  . lisinopril-hydrochlorothiazide (PRINZIDE,ZESTORETIC) 20-12.5 MG tablet Take 1 tablet by mouth 2 (two) times daily.   04/13/2018 at 2000  .  metoprolol (TOPROL-XL) 200 MG 24 hr tablet Take 100 mg by mouth 2 (two) times daily.   04/13/2018 at 2000  . Multiple Vitamins-Minerals (HAIR SKIN AND NAILS FORMULA) TABS Take 1 tablet by mouth daily.   04/13/2018 at 0800  . pravastatin (PRAVACHOL) 40 MG tablet Take 40 mg by mouth every other day.   Past Week at Unknown time  . verapamil (CALAN-SR) 240 MG CR tablet Take 240 mg by mouth daily.   04/13/2018 at 2000    Assessment: 84 y.o. female with a known history of hypertension, history of breast cancer status post right breast mastectomy, tachycardia presents to hospital secondary to fall.  The patient also had atrial fibrillation with rapid ventricular rate for which currently she is not have any  heart failure or myocardial infarction or chest pain.  Per cardiology patient recommended to be started on heparin drip.  Goal of Therapy:  Heparin level 0.3-0.7 units/ml Monitor platelets by anticoagulation protocol: Yes   Plan:  12/21 @ 0400 HL 0.35 therapeutic. Will continue @ 1100 units/hr and will recheck next HL w/ am labs. CBC stable will continue to monitor.  Tobie Lords, PharmD, BCPS Clinical Pharmacist 04/20/2018

## 2018-04-20 NOTE — Progress Notes (Signed)
[facility] Encounter Note  Patient: Destiny Oliver / Admit Date: 04/18/2018 / Date of Encounter: 04/20/2018, 9:32 AM   Subjective: Patient slightly improved with less shortness of breath.  Patient is still kind of weak and fatigued and unable to get out of the bed at this time.  Heart rate improved with amiodarone diltiazem at about of 110 bpm.  No evidence of myocardial infarction although elevated troponin most consistent with demand ischemia.  No current evidence of congestive heart failure.  Currently cannot ambulate due to weakness Echocardiogram showing normal LV systolic function without evidence of congestive heart failure and ejection fraction of 55 to 60% Review of Systems: Positive for: Weakness Negative for: Vision change, hearing change, syncope, dizziness, nausea, vomiting,diarrhea, bloody stool, stomach pain, cough, congestion, diaphoresis, urinary frequency, urinary pain,skin lesions, skin rashes Others previously listed  Objective: Telemetry: Atrial fibrillation with rapid ventricular rate Physical Exam: Blood pressure 126/87, pulse (!) 106, temperature 97.6 F (36.4 C), temperature source Oral, resp. rate (!) 22, height 5\' 1"  (1.549 m), weight 86.8 kg, SpO2 93 %. Body mass index is 36.16 kg/m. General: Well developed, well nourished, in no acute distress. Head: Normocephalic, atraumatic, sclera non-icteric, no xanthomas, nares are without discharge. Neck: No apparent masses Lungs: Normal respirations with no wheezes, no rhonchi, no rales , few crackles   Heart: Irregular rate and rhythm, normal S1 S2, no murmur, no rub, no gallop, PMI is normal size and placement, carotid upstroke normal without bruit, jugular venous pressure normal Abdomen: Soft, non-tender, non-distended with normoactive bowel sounds. No hepatosplenomegaly. Abdominal aorta is normal size without bruit Extremities: Trace edema, no clubbing, no cyanosis, no ulcers,  Peripheral: 2+  radial, 2+ femoral, 2+ dorsal pedal pulses Neuro: Alert and oriented. Moves all extremities spontaneously. Psych:  Responds to questions appropriately with a normal affect.   Intake/Output Summary (Last 24 hours) at 04/20/2018 0932 Last data filed at 04/20/2018 0900 Gross per 24 hour  Intake 2473.64 ml  Output 251 ml  Net 2222.64 ml    Inpatient Medications:  . amiodarone  150 mg Intravenous Once  . aspirin EC  81 mg Oral Daily  . cholecalciferol  1,000 Units Oral Daily  . diltiazem  240 mg Oral Daily  . docusate sodium  100 mg Oral BID  . ferrous sulfate  325 mg Oral Daily  . hydrALAZINE  100 mg Oral Daily  . lisinopril  20 mg Oral BID   And  . hydrochlorothiazide  12.5 mg Oral BID  . lidocaine  1 patch Transdermal Q24H  . magnesium citrate  0.5 Bottle Oral Once  . metoprolol  100 mg Oral BID  . multivitamin with minerals  1 tablet Oral Daily  . pravastatin  40 mg Oral QODAY  . cyanocobalamin  1,000 mcg Oral Daily   Infusions:  . sodium chloride 75 mL/hr at 04/20/18 0900  . amiodarone 30 mg/hr (04/20/18 0900)  . heparin 1,100 Units/hr (04/20/18 0900)    Labs: Recent Labs    04/19/18 0527 04/20/18 0347 04/20/18 0558  NA 137 137  --   K 3.6 4.0  --   CL 109 111  --   CO2 21* 20*  --   GLUCOSE 135* 141*  --   BUN 39* 35*  --   CREATININE 1.23* 1.12*  --   CALCIUM 8.5* 8.4*  --   MG 1.8 2.4  --   PHOS 2.4*  --  2.2*   Recent Labs    04/18/18  1128  AST 119*  ALT 77*  ALKPHOS 75  BILITOT 1.4*  PROT 7.2  ALBUMIN 4.0   Recent Labs    04/18/18 1128  04/19/18 0527 04/20/18 0347  WBC 11.4*  --  10.3 9.5  NEUTROABS 9.1*  --   --   --   HGB 15.1*   < > 12.4 12.2  HCT 47.1*  --  38.7 38.9  MCV 88.9  --  88.4 90.5  PLT 280  --  220 219   < > = values in this interval not displayed.   Recent Labs    04/18/18 1128 04/18/18 1830 04/18/18 2327 04/19/18 0527 04/20/18 0558  CKTOTAL 1,767*  --   --  474* 307*  TROPONINI 0.20* 0.25* 0.24* 0.16*  --     Invalid input(s): POCBNP No results for input(s): HGBA1C in the last 72 hours.   Weights: Filed Weights   04/18/18 1121 04/19/18 0907  Weight: 86.2 kg 86.8 kg     Radiology/Studies:  Ct Head Wo Contrast  Result Date: 04/18/2018 CLINICAL DATA:  Head trauma EXAM: CT HEAD WITHOUT CONTRAST CT MAXILLOFACIAL WITHOUT CONTRAST TECHNIQUE: Multidetector CT imaging of the head and maxillofacial structures were performed using the standard protocol without intravenous contrast. Multiplanar CT image reconstructions of the maxillofacial structures were also generated. COMPARISON:  CT head 07/12/2014, MRI 01/19/2007 FINDINGS: CT HEAD FINDINGS Brain: Mild to moderate atrophy. Hypodensity in the cerebral white matter bilaterally consistent with chronic microvascular ischemia. 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 Adjacent developmental venous anomaly also present. Negative for acute infarct, hemorrhage, mass.  No midline shift. Vascular: Atherosclerotic calcification. Negative for hyperdense vessel Skull: Negative Other: None CT MAXILLOFACIAL FINDINGS Osseous: Negative for facial fracture Orbits: Bilateral cataract surgery. Scleral band on the left. No orbital mass. Sinuses: Large retention cyst right maxillary sinus. No air-fluid level. Soft tissues: No soft tissue mass or swelling. IMPRESSION: 1. No acute intracranial abnormality 2. Negative for facial fracture. Electronically Signed   By: Franchot Gallo M.D.   On: 04/18/2018 12:52   Dg Abd Portable 1v  Result Date: 04/20/2018 CLINICAL DATA:  Abdominal pain. History of chronic kidney disease, breast cancer and cholelithiasis. EXAM: PORTABLE ABDOMEN - 1 VIEW COMPARISON:  None. FINDINGS: The bowel gas pattern is normal. Mild stool distended rectum. Phleboliths project in the pelvis. No radio-opaque calculi or other significant radiographic abnormality are seen. IMPRESSION: Mild stool  distended rectum.  Normal bowel gas pattern. Electronically Signed   By: Elon Alas M.D.   On: 04/20/2018 02:13   Dg Hip Unilat With Pelvis 2-3 Views Left  Result Date: 04/18/2018 CLINICAL DATA:  Right hip pain after unwitnessed fall 4 days ago. EXAM: DG HIP (WITH OR WITHOUT PELVIS) 2-3V LEFT COMPARISON:  None. FINDINGS: There is no evidence of hip fracture or dislocation. There is no evidence of arthropathy or other focal bone abnormality. IMPRESSION: Negative. Electronically Signed   By: Marijo Conception, M.D.   On: 04/18/2018 12:40   Dg Hip Unilat  With Pelvis 2-3 Views Right  Result Date: 04/18/2018 CLINICAL DATA:  Unwitnessed fall 4 days ago.  Pain. EXAM: DG HIP (WITH OR WITHOUT PELVIS) 2-3V RIGHT COMPARISON:  None. FINDINGS: There is no evidence of hip fracture or dislocation. There is no evidence of arthropathy or other focal bone abnormality. IMPRESSION: Negative. Electronically Signed   By: Dorise Bullion III M.D   On: 04/18/2018 12:39   Ct Maxillofacial Wo Contrast  Result Date: 04/18/2018 CLINICAL DATA:  Head trauma EXAM: CT HEAD WITHOUT CONTRAST CT MAXILLOFACIAL WITHOUT CONTRAST TECHNIQUE: Multidetector CT imaging of the head and maxillofacial structures were performed using the standard protocol without intravenous contrast. Multiplanar CT image reconstructions of the maxillofacial structures were also generated. COMPARISON:  CT head 07/12/2014, MRI 01/19/2007 FINDINGS: CT HEAD FINDINGS Brain: Mild to moderate atrophy. Hypodensity in the cerebral white matter bilaterally consistent with chronic microvascular ischemia. 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 Adjacent developmental venous anomaly also present. Negative for acute infarct, hemorrhage, mass.  No midline shift. Vascular: Atherosclerotic calcification. Negative for hyperdense vessel Skull: Negative Other: None CT MAXILLOFACIAL FINDINGS Osseous: Negative  for facial fracture Orbits: Bilateral cataract surgery. Scleral band on the left. No orbital mass. Sinuses: Large retention cyst right maxillary sinus. No air-fluid level. Soft tissues: No soft tissue mass or swelling. IMPRESSION: 1. No acute intracranial abnormality 2. Negative for facial fracture. Electronically Signed   By: Franchot Gallo M.D.   On: 04/18/2018 12:52     Assessment and Recommendation  84 y.o. female with known essential hypertension mixed hyperlipidemia chronic kidney disease stage III with acute fall and rhabdomyolysis now slightly improved at this time without evidence of myocardial infarction having continued atrial fibrillation with rapid ventricular rate and no evidence of heart failure 1.  Continue heart rate control with amiodarone but changing to oral amiodarone 400 mg twice per day and discontinuation of drip awaiting for the possibility of spontaneous conversion to normal sinus rhythm with combination 2.  Anticoagulation for further risk reduction in stroke with atrial fibrillation with heparin and switched to Eliquis 5 mg twice per day 3.  Attenuation of oral heart rate controlling medication management including diltiazem 240 mg each day and metoprolol 100 mg each day 4.  Continue supportive care of fall and rhabdomyolysis 5.  Okay for transfer to floor for further rehabilitation  Signed, Serafina Royals M.D. FACC

## 2018-04-20 NOTE — Progress Notes (Signed)
[facility] NAME: Destiny Oliver    MR#:  [id number]  DATE OF BIRTH:  1926-09-28  SUBJECTIVE:   No events overnight, family at bedside, cardiology input appreciated, possible transfer to the floor  REVIEW OF SYSTEMS:    Review of Systems  Constitutional: Negative for fever, chills weight loss HENT: Negative for ear pain, nosebleeds, congestion, facial swelling, rhinorrhea, neck pain, neck stiffness and ear discharge.   Respiratory: Negative for cough, shortness of breath, wheezing  Cardiovascular: Negative for chest pain, palpitations and leg swelling.  Gastrointestinal: Negative for heartburn, abdominal pain, vomiting, diarrhea or consitpation Genitourinary: Negative for dysuria, urgency, frequency, hematuria Musculoskeletal: Negative for back pain or joint pain Neurological: Negative for dizziness, seizures, syncope, focal weakness,  numbness and headaches.  Hematological: Does not bruise/bleed easily.  Psychiatric/Behavioral: Negative for hallucinations, confusion, dysphoric mood    Tolerating Diet: yes      DRUG ALLERGIES:   Allergies  Allergen Reactions  . Penicillins Swelling    VITALS:  Blood pressure 140/89, pulse (!) 113, temperature 97.6 F (36.4 C), temperature source Oral, resp. rate (!) 28, height 5\' 1"  (1.549 m), weight 86.8 kg, SpO2 93 %.  PHYSICAL EXAMINATION:  Constitutional: Appears well-developed and well-nourished. No distress. HENT: Normocephalic. Marland Kitchen Oropharynx is clear and moist.  Eyes: Conjunctivae and EOM are normal. PERRLA, no scleral icterus.  Neck: Normal ROM. Neck supple. No JVD. No tracheal deviation. CVS: irr, irr tach, S1/S2 +, 2/6 murmurs, no gallops, no carotid bruit.  Pulmonary: Effort and breath sounds normal, no stridor, rhonchi, wheezes, rales.  Abdominal: Soft. BS +,  no distension, tenderness, rebound or guarding.  Musculoskeletal: Normal range of motion. No edema and no  tenderness.  Neuro: Alert. CN 2-12 grossly intact. No focal deficits. Skin: Skin is warm and dry. No rash noted. brusing from fall face Psychiatric: Normal mood and affect.      LABORATORY PANEL:   CBC Recent Labs  Lab 04/20/18 0347  WBC 9.5  HGB 12.2  HCT 38.9  PLT 219   ------------------------------------------------------------------------------------------------------------------  Chemistries  Recent Labs  Lab 04/18/18 1128  04/20/18 0347  NA 142   < > 137  K 3.2*   < > 4.0  CL 106   < > 111  CO2 20*   < > 20*  GLUCOSE 129*   < > 141*  BUN 38*   < > 35*  CREATININE 1.21*   < > 1.12*  CALCIUM 9.6   < > 8.4*  MG 2.0   < > 2.4  AST 119*  --   --   ALT 77*  --   --   ALKPHOS 75  --   --   BILITOT 1.4*  --   --    < > = values in this interval not displayed.   ------------------------------------------------------------------------------------------------------------------  Cardiac Enzymes Recent Labs  Lab 04/18/18 1830 04/18/18 2327 04/19/18 0527  TROPONINI 0.25* 0.24* 0.16*   ------------------------------------------------------------------------------------------------------------------  RADIOLOGY:  Dg Abd Portable 1v  Result Date: 04/20/2018 CLINICAL DATA:  Abdominal pain. History of chronic kidney disease, breast cancer and cholelithiasis. EXAM: PORTABLE ABDOMEN - 1 VIEW COMPARISON:  None. FINDINGS: The bowel gas pattern is normal. Mild stool distended rectum. Phleboliths project in the pelvis. No radio-opaque calculi or other significant radiographic abnormality are seen. IMPRESSION: Mild stool distended rectum.  Normal bowel gas pattern. Electronically Signed   By: Elon Alas M.D.   On: 04/20/2018 02:13  ASSESSMENT AND PLAN:  84 year old female with a history of breast cancer status post right breast mastectomy and hypertension who presented to the hospital after a fall.  *Acute rhabdomyolysis due to fall Resolving with IV fluids for  rehydration  *Acute elevated troponin Secondary to demand ischemia Patient has ruled out for ACS  *Atrial fibrillation with RVR Dr. Kowalski/cardiology input appreciated, continue amiodarone, heparin drip conversion to Eliquis, continue diltiazem 240 mg daily, beta-blocker therapy  *Essential hypertension Stable Continue lisinopril, HCTZ, metoprolol, diltiazem  Management plans discussed with the patient and family and they are in agreement.  CODE STATUS: FULL  TOTAL TIME TAKING CARE OF THIS PATIENT: 73minutes.     POSSIBLE D/C 1-2 days, DEPENDING ON CLINICAL CONDITION.   [name] Finneas Mathe M.D on 04/20/2018 at 1:52 PM  Between 7am to 6pm - Pager - [phone] After 6pm go to [email or web address] - password [email or web address] [place] [facility]  Office  [phone]  CC: Primary care physician; Juluis Pitch, MD  Note: This dictation was prepared with Dragon dictation along with smaller phrase technology. Any transcriptional errors that result from this process are unintentional.

## 2018-04-20 NOTE — Progress Notes (Signed)
PT Cancellation Note  Patient Details Name: Destiny Oliver MRN: [id number] DOB: 02-Apr-1927   Cancelled Treatment:    Reason Eval/Treat Not Completed: Other (comment).  Nursing asked PT to hold this visit due to poor sleep and lethargy of the pt.  Will reattempt in the AM as pt will permit, as well as her condition.   Ramond Dial 04/20/2018, 12:08 PM   Mee Hives, PT MS Acute Rehab Dept. Number: [place] and [place]

## 2018-04-20 NOTE — Plan of Care (Signed)
  Problem: Elimination: Goal: Will not experience complications related to bowel motility Outcome: Not Progressing   

## 2018-04-20 NOTE — Progress Notes (Signed)
Pharmacy Electrolyte Monitoring Consult:  Pharmacy consulted to assist in monitoring and replacing electrolytes in this 84 y.o. female admitted on 04/18/2018 with Fall   Labs:  Sodium (mmol/L)  Date Value  04/20/2018 137  06/02/2012 143   Potassium (mmol/L)  Date Value  04/20/2018 4.0  06/02/2012 3.4 (L)   Magnesium (mg/dL)  Date Value  04/20/2018 2.4   Phosphorus (mg/dL)  Date Value  04/20/2018 2.2 (L)   Calcium (mg/dL)  Date Value  04/20/2018 8.4 (L)   Calcium, Total (mg/dL)  Date Value  06/02/2012 9.2   Albumin (g/dL)  Date Value  04/18/2018 4.0  06/02/2012 3.9   12/20- Patient received potassium 37mEq PO Q4hr x 2 doses. Will order potassium 85mEq PO x 1 and magnesium 2g IV x 1.  Assessment/Plan: K 4.0 Mag 2.4  Phos 2.2  Scr 1.12  Will order KPhos neutral PO  2 tabs every 4 hrs x 4 doses. Will replace for goal potassium ~4 and goal magnesium ~2.   Will obtain follow up electrolytes with am labs.   Pharmacy will continue to monitor and adjust per consult.   Harjas Biggins A 04/20/2018 9:56 AM

## 2018-04-21 ENCOUNTER — Encounter: Payer: Self-pay | Admitting: *Deleted

## 2018-04-21 ENCOUNTER — Inpatient Hospital Stay: Payer: Medicare Other

## 2018-04-21 LAB — BASIC METABOLIC PANEL
Anion gap: 7 (ref 5–15)
BUN: 39 mg/dL — ABNORMAL HIGH (ref 8–23)
CO2: 21 mmol/L — ABNORMAL LOW (ref 22–32)
Calcium: 8.4 mg/dL — ABNORMAL LOW (ref 8.9–10.3)
Chloride: 111 mmol/L (ref 98–111)
Creatinine, Ser: 2.07 mg/dL — ABNORMAL HIGH (ref 0.44–1.00)
GFR calc Af Amer: 24 mL/min — ABNORMAL LOW (ref >60)
GFR calc non Af Amer: 21 mL/min — ABNORMAL LOW (ref >60)
GLUCOSE: 125 mg/dL — AB (ref 70–99)
Potassium: 4.5 mmol/L (ref 3.5–5.1)
Sodium: 139 mmol/L (ref 135–145)

## 2018-04-21 LAB — HEPARIN LEVEL (UNFRACTIONATED): Heparin Unfractionated: >3.6 IU/mL — ABNORMAL HIGH (ref 0.30–0.70)

## 2018-04-21 LAB — PHOSPHORUS: Phosphorus: 4.8 mg/dL — ABNORMAL HIGH (ref 2.5–4.6)

## 2018-04-21 MED ORDER — OXYCODONE-ACETAMINOPHEN 5-325 MG PO TABS
1.0000 | ORAL_TABLET | Freq: Three times a day (TID) | ORAL | Status: DC | PRN
  Administered 2018-04-21 – 2018-04-27 (×5): 1 via ORAL
  Filled 2018-04-21 (×5): qty 1

## 2018-04-21 MED ORDER — CYCLOBENZAPRINE HCL 10 MG PO TABS: 5.0000 mg | ORAL_TABLET | Freq: Three times a day (TID) | ORAL | Status: DC | PRN

## 2018-04-21 MED ORDER — APIXABAN 2.5 MG PO TABS
2.5000 mg | ORAL_TABLET | Freq: Two times a day (BID) | ORAL | Status: DC
  Administered 2018-04-21 – 2018-04-28 (×15): 2.5 mg via ORAL
  Filled 2018-04-21 (×15): qty 1

## 2018-04-21 NOTE — Progress Notes (Addendum)
[place] [facility] [place] NAME: Destiny Oliver    MR#:  [id number]  DATE OF BIRTH:  Sep 25, 1926  SUBJECTIVE:   No events overnight, family at bedside, cardiology input appreciated, creatinine 2 noted, appears clinically dry, IV fluids for rehydration, encourage p.o. intake, ACE inhibitor and hydrochlorothiazide discontinued, discontinue tramadol, await physical therapy evaluation REVIEW OF SYSTEMS:    Review of Systems  Constitutional: Negative for fever, chills weight loss HENT: Negative for ear pain, nosebleeds, congestion, facial swelling, rhinorrhea, neck pain, neck stiffness and ear discharge.   Respiratory: Negative for cough, shortness of breath, wheezing  Cardiovascular: Negative for chest pain, palpitations and leg swelling.  Gastrointestinal: Negative for heartburn, abdominal pain, vomiting, diarrhea or consitpation Genitourinary: Negative for dysuria, urgency, frequency, hematuria Musculoskeletal: Negative for back pain or joint pain Neurological: Negative for dizziness, seizures, syncope, focal weakness,  numbness and headaches.  Hematological: Does not bruise/bleed easily.  Psychiatric/Behavioral: Negative for hallucinations, confusion, dysphoric mood    Tolerating Diet: yes      DRUG ALLERGIES:   Allergies  Allergen Reactions  . Penicillins Swelling    VITALS:  Blood pressure 122/83, pulse (!) 113, temperature 98.1 F (36.7 C), temperature source Oral, resp. rate 20, height 5\' 1"  (1.549 m), weight 91.8 kg, SpO2 90 %.  PHYSICAL EXAMINATION:  Constitutional: Appears well-developed and well-nourished. No distress. HENT: Normocephalic. Marland Kitchen Oropharynx is clear and moist.  Eyes: Conjunctivae and EOM are normal. PERRLA, no scleral icterus.  Neck: Normal ROM. Neck supple. No JVD. No tracheal deviation. CVS: irr, irr tach, S1/S2 +, 2/6 murmurs, no gallops, no carotid bruit.  Pulmonary: Effort and breath sounds normal, no stridor,  rhonchi, wheezes, rales.  Abdominal: Soft. BS +,  no distension, tenderness, rebound or guarding.  Musculoskeletal: Normal range of motion. No edema and no tenderness.  Neuro: Alert. CN 2-12 grossly intact. No focal deficits. Skin: Skin is warm and dry. No rash noted. brusing from fall face Psychiatric: Normal mood and affect.      LABORATORY PANEL:   CBC Recent Labs  Lab 04/20/18 0347  WBC 9.5  HGB 12.2  HCT 38.9  PLT 219   ------------------------------------------------------------------------------------------------------------------  Chemistries  Recent Labs  Lab 04/18/18 1128  04/20/18 0347 04/21/18 0331  NA 142   < > 137 139  K 3.2*   < > 4.0 4.5  CL 106   < > 111 111  CO2 20*   < > 20* 21*  GLUCOSE 129*   < > 141* 125*  BUN 38*   < > 35* 39*  CREATININE 1.21*   < > 1.12* 2.07*  CALCIUM 9.6   < > 8.4* 8.4*  MG 2.0   < > 2.4  --   AST 119*  --   --   --   ALT 77*  --   --   --   ALKPHOS 75  --   --   --   BILITOT 1.4*  --   --   --    < > = values in this interval not displayed.   ------------------------------------------------------------------------------------------------------------------  Cardiac Enzymes Recent Labs  Lab 04/18/18 1830 04/18/18 2327 04/19/18 0527  TROPONINI 0.25* 0.24* 0.16*   ------------------------------------------------------------------------------------------------------------------  RADIOLOGY:  US Renal  Result Date: 04/21/2018 CLINICAL DATA:  Acute kidney injury, hypertension EXAM: RENAL / URINARY TRACT ULTRASOUND COMPLETE COMPARISON:  None. FINDINGS: Right Kidney: Renal measurements: 9.5 x 5.3 x 4.5 cm = volume: 121 mL. Normal echogenicity and cortical thickness.  Mild pelviectasis. No definite hydronephrosis. Two renal cysts noted in the upper and midpole regions, largest measures 2.3 cm. Left Kidney: Renal measurements: 10.4 x 5.5 x 6.9 cm = volume: 204 mL. Normal echogenicity and cortical thickness. No  hydronephrosis. Scattered hypoechoic renal cortical cyst in the upper and lower poles, largest measures 2.4 cm. Bladder: Appears normal for degree of bladder distention. IMPRESSION: Bilateral renal cysts. No significant or acute finding by ultrasound. Electronically Signed   By: Jerilynn Mages.  Shick M.D.   On: 04/21/2018 11:28   Dg Abd Portable 1v  Result Date: 04/20/2018 CLINICAL DATA:  Abdominal pain. History of chronic kidney disease, breast cancer and cholelithiasis. EXAM: PORTABLE ABDOMEN - 1 VIEW COMPARISON:  None. FINDINGS: The bowel gas pattern is normal. Mild stool distended rectum. Phleboliths project in the pelvis. No radio-opaque calculi or other significant radiographic abnormality are seen. IMPRESSION: Mild stool distended rectum.  Normal bowel gas pattern. Electronically Signed   By: Elon Alas M.D.   On: 04/20/2018 02:13     ASSESSMENT AND PLAN:  84 year old female with a history of breast cancer status post right breast mastectomy and hypertension who presented to the hospital after a fall.  *Acute rhabdomyolysis due to fall Resolving with IV fluids for rehydration  *Acute kidney injury Likely secondary to above as well as dehydration Discontinue lisinopril/hydrochlorothiazide, IV fluids for rehydration, avoid nephrotoxic agents, BMP in the morning Renal ultrasound was unimpressive  *Acute elevated troponin Secondary to demand ischemia Patient has ruled out for ACS  *Atrial fibrillation with RVR Dr. Kowalski/cardiology input appreciated, continue amiodarone, Eliquis, continue diltiazem 240 mg daily, beta-blocker therapy  *Essential hypertension Stable Lisinopril/hydrochlorothiazide discontinued due to acute kidney injury/dehydration, continue metoprolol, diltiazem  Management plans discussed with the patient and family and they are in agreement.  CODE STATUS: FULL  TOTAL TIME TAKING CARE OF THIS PATIENT: 66minutes.     POSSIBLE D/C 1-2 days, DEPENDING ON CLINICAL  CONDITION.   [name] Malvin Morrish M.D on 04/21/2018 at 12:27 PM  Between 7am to 6pm - Pager - [phone] After 6pm go to [email or web address] - password [email or web address] [facility]  Office  [phone]  CC: Primary care physician; Juluis Pitch, MD  Note: This dictation was prepared with Dragon dictation along with smaller phrase technology. Any transcriptional errors that result from this process are unintentional.

## 2018-04-21 NOTE — Evaluation (Signed)
Physical Therapy Evaluation Patient Details Name: Destiny Oliver MRN: [id number] DOB: 1926-10-08 Today's Date: 04/21/2018   History of Present Illness  84 yo female with onset of a-fib and fall at home, with rhabdomyolysis, was admitted.  Cleared for facial fractures, very bruised.  Had elevated troponin but was deemed demand ischemia.  PMHx:  R breast CA, HTN, tachycardia,   Clinical Impression  Pt is up to walk with assistance to sidestep to [facility] only, noting urine incontinence even after use of BSC and fatigue from short trip to [place].  Pt has very low endurance and safety with movement, unreasonable to send home instead of to SNF.  Follow up with acute therapy to strengthen and increase balance as tolerated and progress her mobility as she goes on to SNF care for finishing up of increasing independence of gait and balance.    Follow Up Recommendations SNF    Equipment Recommendations  Rolling walker with 5" wheels    Recommendations for Other Services       Precautions / Restrictions Precautions Precautions: Fall(telemetry) Precaution Comments: monitor for fall risk Restrictions Weight Bearing Restrictions: No      Mobility  Bed Mobility Overal bed mobility: Needs Assistance Bed Mobility: Supine to Sit;Sit to Supine     Supine to sit: Mod assist Sit to supine: Max assist   General bed mobility comments: assisted with direction sequence for both, mod to lift trunk and max for sitting pivot back to bed  Transfers Overall transfer level: Needs assistance Equipment used: Rolling walker (2 wheeled);1 person hand held assist Transfers: Sit to/from Stand Sit to Stand: Mod assist         General transfer comment: requires assistance to manage her balance with initial standing and incontinent of urine every time standing  Ambulation/Gait             General Gait Details: transfers only  Stairs            Wheelchair Mobility    Modified Rankin (Stroke  Patients Only)       Balance Overall balance assessment: History of Falls;Needs assistance Sitting-balance support: Feet supported;Bilateral upper extremity supported Sitting balance-Leahy Scale: Fair     Standing balance support: Bilateral upper extremity supported;During functional activity Standing balance-Leahy Scale: Poor                               Pertinent Vitals/Pain Pain Assessment: No/denies pain    Home Living Family/patient expects to be discharged to:: Private residence Living Arrangements: Alone Available Help at Discharge: Family;Available PRN/intermittently Type of Home: House       Home Layout: One level Home Equipment: [place] - 2 wheels;Cane - single point Additional Comments: pt has been home wiht drop in help at most in recent past    Prior Function Level of Independence: Independent with assistive device(s)               Hand Dominance   Dominant Hand: Right    Extremity/Trunk Assessment   Upper Extremity Assessment Upper Extremity Assessment: Generalized weakness    Lower Extremity Assessment Lower Extremity Assessment: Generalized weakness    Cervical / Trunk Assessment Cervical / Trunk Assessment: Kyphotic  Communication   Communication: HOH(hearing aids are charging)  Cognition Arousal/Alertness: Awake/alert Behavior During Therapy: WFL for tasks assessed/performed Overall Cognitive Status: Within Functional Limits for tasks assessed  General Comments: pt is weak and incontinent      General Comments      Exercises     Assessment/Plan    PT Assessment Patient needs continued PT services  PT Problem List Decreased range of motion;Decreased strength;Decreased activity tolerance;Decreased balance;Decreased mobility;Decreased coordination;Decreased knowledge of use of DME;Decreased safety awareness;Obesity;Decreased skin integrity       PT Treatment  Interventions DME instruction;Gait training;Functional mobility training;Therapeutic activities;Therapeutic exercise;Balance training;Neuromuscular re-education;Patient/family education    PT Goals (Current goals can be found in the Care Plan section)  Acute Rehab PT Goals Patient Stated Goal: to get up to [facility] PT Goal Formulation: With patient Time For Goal Achievement: 05/05/18 Potential to Achieve Goals: Fair    Frequency Min 2X/week   Barriers to discharge Inaccessible home environment;Decreased caregiver support home alone with insufficient help    Co-evaluation               AM-PAC PT "6 Clicks" Mobility  Outcome Measure Help needed turning from your back to your side while in a flat bed without using bedrails?: A Lot Help needed moving from lying on your back to sitting on the side of a flat bed without using bedrails?: A Lot Help needed moving to and from a bed to a chair (including a wheelchair)?: A Lot Help needed standing up from a chair using your arms (e.g., wheelchair or bedside chair)?: A Lot Help needed to walk in hospital room?: A Lot Help needed climbing 3-5 steps with a railing? : A Lot 6 Click Score: 12    End of Session Equipment Utilized During Treatment: Gait belt Activity Tolerance: Patient limited by fatigue Patient left: in bed;with call bell/phone within reach;with bed alarm set Nurse Communication: Mobility status PT Visit Diagnosis: Unsteadiness on feet (R26.81);Muscle weakness (generalized) (M62.81);Difficulty in walking, not elsewhere classified (R26.2);Adult, failure to thrive (R62.7)    Time: 6203-5597 PT Time Calculation (min) (ACUTE ONLY): 27 min   Charges:   PT Evaluation $PT Eval Moderate Complexity: 1 Mod PT Treatments $Therapeutic Activity: 8-22 mins       Ramond Dial 04/21/2018, 2:19 PM  Mee Hives, PT MS Acute Rehab Dept. Number: [place] and [place]

## 2018-04-21 NOTE — Progress Notes (Signed)
PHARMACY NOTE:  RENAL DOSAGE ADJUSTMENT  Current apixaban regimen includes a mismatch between dosage and estimated renal function.  As per policy approved by the Pharmacy & Therapeutics and Medical Executive Committees, the apixaban dosage will be adjusted accordingly.  Current apixaban dosage:  5mg  BID  Indication: afib  Renal Function:  Estimated Creatinine Clearance: 19 mL/min (A) (by C-G formula based on SCr of 2.07 mg/dL (H)). []      On intermittent HD, scheduled: []      On CRRT    apixaban dosage has been changed to:  2.5mg  BID   Thank you for allowing pharmacy to be a part of this patient's care.  Dallie Piles, PharmD 04/21/2018 8:00 AM

## 2018-04-21 NOTE — Consult Note (Signed)
[facility] for Heparin infusion Indication: atrial fibrillation  Allergies  Allergen Reactions  . Penicillins Swelling    Patient Measurements: Height: 5\' 1"  (154.9 cm) Weight: 202 lb 9.6 oz (91.9 kg) IBW/kg (Calculated) : 47.8 Heparin Dosing Weight: 67.7  Vital Signs: Temp: 98.1 F (36.7 C) (12/22 0327) Temp Source: Oral (12/22 0327) BP: 113/78 (12/22 0327) Pulse Rate: 90 (12/22 0327)  Labs: Recent Labs    04/18/18 1128 04/18/18 1830 04/18/18 2327  04/19/18 0527  04/19/18 1814 04/20/18 0347 04/20/18 0558 04/21/18 0331  HGB 15.1* 13.5  --   --  12.4  --   --  12.2  --   --   HCT 47.1*  --   --   --  38.7  --   --  38.9  --   --   PLT 280  --   --   --  220  --   --  219  --   --   APTT  --  28  --   --   --   --   --   --   --   --   LABPROT  --  14.3  --   --   --   --   --   --  14.1  --   INR  --  1.12  --   --   --   --   --   --  1.10  --   HEPARINUNFRC  --   --   --    < >  --    < > 0.33 0.35  --  >3.60*  CREATININE 1.21*  --   --   --  1.23*  --   --  1.12*  --  2.07*  CKTOTAL 1,767*  --   --   --  474*  --   --   --  307*  --   TROPONINI 0.20* 0.25* 0.24*  --  0.16*  --   --   --   --   --    < > = values in this interval not displayed.    Estimated Creatinine Clearance: 19 mL/min (A) (by C-G formula based on SCr of 2.07 mg/dL (H)).   Medications:  Medications Prior to Admission  Medication Sig Dispense Refill Last Dose  . aspirin EC 81 MG tablet Take 81 mg by mouth daily.   04/13/2018 at 0800  . cholecalciferol (VITAMIN D3) 25 MCG (1000 UT) tablet Take 1,000 Units by mouth daily.   04/13/2018 at 0800  . cyanocobalamin ([employer] VITAMIN B12) 2000 MCG tablet Take 1,000 mcg by mouth daily.   04/13/2018 at 0800  . ferrous sulfate 325 (65 FE) MG tablet Take 325 mg by mouth daily.   04/13/2018 at 0800  . hydrALAZINE (APRESOLINE) 50 MG tablet Take 100 mg by mouth daily.   04/13/2018 at 0800  . lisinopril-hydrochlorothiazide  (PRINZIDE,ZESTORETIC) 20-12.5 MG tablet Take 1 tablet by mouth 2 (two) times daily.   04/13/2018 at 2000  . metoprolol (TOPROL-XL) 200 MG 24 hr tablet Take 100 mg by mouth 2 (two) times daily.   04/13/2018 at 2000  . Multiple Vitamins-Minerals (HAIR SKIN AND NAILS FORMULA) TABS Take 1 tablet by mouth daily.   04/13/2018 at 0800  . pravastatin (PRAVACHOL) 40 MG tablet Take 40 mg by mouth every other day.   Past Week at Unknown time  . verapamil (CALAN-SR) 240 MG CR tablet Take 240 mg  by mouth daily.   04/13/2018 at 2000    Assessment: 84 y.o. female with a known history of hypertension, history of breast cancer status post right breast mastectomy, tachycardia presents to hospital secondary to fall.  The patient also had atrial fibrillation with rapid ventricular rate for which currently she is not have any heart failure or myocardial infarction or chest pain.  Per cardiology patient recommended to be started on heparin drip.  Goal of Therapy:  Heparin level 0.3-0.7 units/ml Monitor platelets by anticoagulation protocol: Yes   Plan:  12/22 @ 0500 HL > 3.60, patient has been started on apixaban 5mg  bid and received 2 doses, therefore HL not accurate. Recommend heparin order to be d/c'd since patient is now on apixaban.  Tobie Lords, PharmD, BCPS Clinical Pharmacist 04/21/2018

## 2018-04-21 NOTE — Progress Notes (Signed)
PT Cancellation Note  Patient Details Name: Destiny Oliver MRN: [id number] DOB: 09/25/1926   Cancelled Treatment:    Reason Eval/Treat Not Completed: Patient at procedure or test/unavailable.  Pt is unavailable and will try later as time and pt allow.   Ramond Dial 04/21/2018, 10:14 AM   Mee Hives, PT MS Acute Rehab Dept. Number: [place] and [place]

## 2018-04-21 NOTE — Progress Notes (Signed)
[facility] Encounter Note  Patient: Destiny Oliver / Admit Date: 04/18/2018 / Date of Encounter: 04/21/2018, 8:15 AM   Subjective: Patient slightly improved with less shortness of breath.  Patient is still kind of weak and fatigued and unable to get out of the bed at this time.  Heart rate improved with amiodarone diltiazem at about of 70 Bpm.  No evidence of myocardial infarction although elevated troponin most consistent with demand ischemia.  No current evidence of congestive heart failure.  Currently cannot ambulate due to weakness need further physical rehabilitation from this point on Echocardiogram showing normal LV systolic function without evidence of congestive heart failure and ejection fraction of 55 to 60% Review of Systems: Positive for: Weakness Negative for: Vision change, hearing change, syncope, dizziness, nausea, vomiting,diarrhea, bloody stool, stomach pain, cough, congestion, diaphoresis, urinary frequency, urinary pain,skin lesions, skin rashes Others previously listed  Objective: Telemetry: Atrial fibrillation with controlled ventricular rate Physical Exam: Blood pressure 113/78, pulse 90, temperature 98.1 F (36.7 C), temperature source Oral, resp. rate 20, height 5\' 1"  (1.549 m), weight 91.8 kg, SpO2 91 %. Body mass index is 38.22 kg/m. General: Well developed, well nourished, in no acute distress. Head: Normocephalic, atraumatic, sclera non-icteric, no xanthomas, nares are without discharge. Neck: No apparent masses Lungs: Normal respirations with no wheezes, no rhonchi, no rales , few crackles   Heart: Irregular rate and rhythm, normal S1 S2, no murmur, no rub, no gallop, PMI is normal size and placement, carotid upstroke normal without bruit, jugular venous pressure normal Abdomen: Soft, non-tender, non-distended with normoactive bowel sounds. No hepatosplenomegaly. Abdominal aorta is normal size without bruit Extremities: Trace edema, no  clubbing, no cyanosis, no ulcers,  Peripheral: 2+ radial, 2+ femoral, 2+ dorsal pedal pulses Neuro: Alert and oriented. Moves all extremities spontaneously. Psych:  Responds to questions appropriately with a normal affect.   Intake/Output Summary (Last 24 hours) at 04/21/2018 0815 Last data filed at 04/21/2018 0044 Gross per 24 hour  Intake 549.11 ml  Output 300 ml  Net 249.11 ml    Inpatient Medications:  . amiodarone  150 mg Intravenous Once  . amiodarone  400 mg Oral BID  . apixaban  2.5 mg Oral BID  . aspirin EC  81 mg Oral Daily  . cholecalciferol  1,000 Units Oral Daily  . diltiazem  240 mg Oral Daily  . docusate sodium  100 mg Oral BID  . ferrous sulfate  325 mg Oral Daily  . hydrALAZINE  100 mg Oral Daily  . hydrochlorothiazide  12.5 mg Oral BID  . lidocaine  1 patch Transdermal Q24H  . metoprolol  100 mg Oral BID  . multivitamin with minerals  1 tablet Oral Daily  . pravastatin  40 mg Oral QODAY  . cyanocobalamin  1,000 mcg Oral Daily   Infusions:  . sodium chloride 75 mL/hr at 04/21/18 0447  . amiodarone Stopped (04/20/18 1105)    Labs: Recent Labs    04/19/18 0527 04/20/18 0347 04/20/18 0558 04/21/18 0331  NA 137 137  --  139  K 3.6 4.0  --  4.5  CL 109 111  --  111  CO2 21* 20*  --  21*  GLUCOSE 135* 141*  --  125*  BUN 39* 35*  --  39*  CREATININE 1.23* 1.12*  --  2.07*  CALCIUM 8.5* 8.4*  --  8.4*  MG 1.8 2.4  --   --   PHOS 2.4*  --  2.2* 4.8*  Recent Labs    04/18/18 1128  AST 119*  ALT 77*  ALKPHOS 75  BILITOT 1.4*  PROT 7.2  ALBUMIN 4.0   Recent Labs    04/18/18 1128  04/19/18 0527 04/20/18 0347  WBC 11.4*  --  10.3 9.5  NEUTROABS 9.1*  --   --   --   HGB 15.1*   < > 12.4 12.2  HCT 47.1*  --  38.7 38.9  MCV 88.9  --  88.4 90.5  PLT 280  --  220 219   < > = values in this interval not displayed.   Recent Labs    04/18/18 1128 04/18/18 1830 04/18/18 2327 04/19/18 0527 04/20/18 0558  CKTOTAL 1,767*  --   --  474* 307*   TROPONINI 0.20* 0.25* 0.24* 0.16*  --    Invalid input(s): POCBNP No results for input(s): HGBA1C in the last 72 hours.   Weights: Filed Weights   04/19/18 3888 04/20/18 1713 04/21/18 0327  Weight: 86.8 kg 91.9 kg 91.8 kg     Radiology/Studies:  Ct Head Wo Contrast  Result Date: 04/18/2018 CLINICAL DATA:  Head trauma EXAM: CT HEAD WITHOUT CONTRAST CT MAXILLOFACIAL WITHOUT CONTRAST TECHNIQUE: Multidetector CT imaging of the head and maxillofacial structures were performed using the standard protocol without intravenous contrast. Multiplanar CT image reconstructions of the maxillofacial structures were also generated. COMPARISON:  CT head 07/12/2014, MRI 01/19/2007 FINDINGS: CT HEAD FINDINGS Brain: Mild to moderate atrophy. Hypodensity in the cerebral white matter bilaterally consistent with chronic microvascular ischemia. 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 Adjacent developmental venous anomaly also present. Negative for acute infarct, hemorrhage, mass.  No midline shift. Vascular: Atherosclerotic calcification. Negative for hyperdense vessel Skull: Negative Other: None CT MAXILLOFACIAL FINDINGS Osseous: Negative for facial fracture Orbits: Bilateral cataract surgery. Scleral band on the left. No orbital mass. Sinuses: Large retention cyst right maxillary sinus. No air-fluid level. Soft tissues: No soft tissue mass or swelling. IMPRESSION: 1. No acute intracranial abnormality 2. Negative for facial fracture. Electronically Signed   By: Franchot Gallo M.D.   On: 04/18/2018 12:52   Dg Abd Portable 1v  Result Date: 04/20/2018 CLINICAL DATA:  Abdominal pain. History of chronic kidney disease, breast cancer and cholelithiasis. EXAM: PORTABLE ABDOMEN - 1 VIEW COMPARISON:  None. FINDINGS: The bowel gas pattern is normal. Mild stool distended rectum. Phleboliths project in the pelvis. No radio-opaque calculi or other significant  radiographic abnormality are seen. IMPRESSION: Mild stool distended rectum.  Normal bowel gas pattern. Electronically Signed   By: Elon Alas M.D.   On: 04/20/2018 02:13   Dg Hip Unilat With Pelvis 2-3 Views Left  Result Date: 04/18/2018 CLINICAL DATA:  Right hip pain after unwitnessed fall 4 days ago. EXAM: DG HIP (WITH OR WITHOUT PELVIS) 2-3V LEFT COMPARISON:  None. FINDINGS: There is no evidence of hip fracture or dislocation. There is no evidence of arthropathy or other focal bone abnormality. IMPRESSION: Negative. Electronically Signed   By: Marijo Conception, M.D.   On: 04/18/2018 12:40   Dg Hip Unilat  With Pelvis 2-3 Views Right  Result Date: 04/18/2018 CLINICAL DATA:  Unwitnessed fall 4 days ago.  Pain. EXAM: DG HIP (WITH OR WITHOUT PELVIS) 2-3V RIGHT COMPARISON:  None. FINDINGS: There is no evidence of hip fracture or dislocation. There is no evidence of arthropathy or other focal bone abnormality. IMPRESSION: Negative. Electronically Signed   By: Dorise Bullion III M.D  On: 04/18/2018 12:39   Ct Maxillofacial Wo Contrast  Result Date: 04/18/2018 CLINICAL DATA:  Head trauma EXAM: CT HEAD WITHOUT CONTRAST CT MAXILLOFACIAL WITHOUT CONTRAST TECHNIQUE: Multidetector CT imaging of the head and maxillofacial structures were performed using the standard protocol without intravenous contrast. Multiplanar CT image reconstructions of the maxillofacial structures were also generated. COMPARISON:  CT head 07/12/2014, MRI 01/19/2007 FINDINGS: CT HEAD FINDINGS Brain: Mild to moderate atrophy. Hypodensity in the cerebral white matter bilaterally consistent with chronic microvascular ischemia. 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 Adjacent developmental venous anomaly also present. Negative for acute infarct, hemorrhage, mass.  No midline shift. Vascular: Atherosclerotic calcification. Negative for hyperdense vessel Skull:  Negative Other: None CT MAXILLOFACIAL FINDINGS Osseous: Negative for facial fracture Orbits: Bilateral cataract surgery. Scleral band on the left. No orbital mass. Sinuses: Large retention cyst right maxillary sinus. No air-fluid level. Soft tissues: No soft tissue mass or swelling. IMPRESSION: 1. No acute intracranial abnormality 2. Negative for facial fracture. Electronically Signed   By: Franchot Gallo M.D.   On: 04/18/2018 12:52     Assessment and Recommendation  84 y.o. female with known essential hypertension mixed hyperlipidemia chronic kidney disease stage III with acute fall and rhabdomyolysis now slightly improved at this time without evidence of myocardial infarction having continued atrial fibrillation with rapid ventricular rate and no evidence of heart failure 1.  Continue heart rate control with amiodarone at 400 mg twice per day  awaiting for the possibility of spontaneous conversion to normal sinus rhythm with combination 2.  Anticoagulation for further risk reduction in stroke with atrial fibrillation  Eliquis 5 mg twice per day 3.  Continuation of oral heart rate controlling medication management including diltiazem 240 mg each day and metoprolol 100 mg each day 4.  Continue supportive care of fall and rhabdomyolysis with physical rehabilitation  5.  No further cardiac diagnostics necessary at this time  Signed, Serafina Royals M.D. FACC

## 2018-04-21 NOTE — Progress Notes (Signed)
Pharmacy Electrolyte Monitoring Consult:  Pharmacy consulted to assist in monitoring and replacing electrolytes in this 84 y.o. female admitted on 04/18/2018 with Fall   Labs:  Sodium (mmol/L)  Date Value  04/21/2018 139  06/02/2012 143   Potassium (mmol/L)  Date Value  04/21/2018 4.5  06/02/2012 3.4 (L)   Magnesium (mg/dL)  Date Value  04/20/2018 2.4   Phosphorus (mg/dL)  Date Value  04/21/2018 4.8 (H)   Calcium (mg/dL)  Date Value  04/21/2018 8.4 (L)   Calcium, Total (mg/dL)  Date Value  06/02/2012 9.2   Albumin (g/dL)  Date Value  04/18/2018 4.0  06/02/2012 3.9   12/20- Patient received potassium 20mEq PO Q4hr x 2 doses. Will order potassium 16mEq PO x 1 and magnesium 2g IV x 1.  Assessment/Plan: K 4.5 Phos 4.8  Scr 2.07  No supplementation at this time. Note that Scr increased. Will replace for goal potassium ~4 and goal magnesium ~2.   Will obtain follow up electrolytes with am labs.   Pharmacy will continue to monitor and adjust per consult.   Jarrod Mcenery A 04/21/2018 9:13 AM

## 2018-04-22 LAB — PHOSPHORUS: Phosphorus: 5.2 mg/dL — ABNORMAL HIGH (ref 2.5–4.6)

## 2018-04-22 LAB — BASIC METABOLIC PANEL
Anion gap: 10 (ref 5–15)
BUN: 39 mg/dL — ABNORMAL HIGH (ref 8–23)
CO2: 20 mmol/L — ABNORMAL LOW (ref 22–32)
CREATININE: 2.23 mg/dL — AB (ref 0.44–1.00)
Calcium: 8.3 mg/dL — ABNORMAL LOW (ref 8.9–10.3)
Chloride: 109 mmol/L (ref 98–111)
GFR calc Af Amer: 22 mL/min — ABNORMAL LOW (ref >60)
GFR calc non Af Amer: 19 mL/min — ABNORMAL LOW (ref >60)
Glucose, Bld: 126 mg/dL — ABNORMAL HIGH (ref 70–99)
Potassium: 4.1 mmol/L (ref 3.5–5.1)
Sodium: 139 mmol/L (ref 135–145)

## 2018-04-22 MED ORDER — SODIUM CHLORIDE 0.45 % IV SOLN
INTRAVENOUS | Status: DC
  Administered 2018-04-22 – 2018-04-23 (×3): via INTRAVENOUS
  Filled 2018-04-22 (×4): qty 150

## 2018-04-22 MED ORDER — ENSURE ENLIVE PO LIQD
237.0000 mL | Freq: Two times a day (BID) | ORAL | Status: DC
  Administered 2018-04-22 – 2018-04-30 (×12): 237 mL via ORAL

## 2018-04-22 MED ORDER — ORAL CARE MOUTH RINSE
15.0000 mL | Freq: Two times a day (BID) | OROMUCOSAL | Status: DC
  Administered 2018-04-22 – 2018-04-29 (×8): 15 mL via OROMUCOSAL

## 2018-04-22 NOTE — Progress Notes (Signed)
Initial Nutrition Assessment  DOCUMENTATION CODES:   Obesity unspecified  INTERVENTION:  Ensure Enlive po BID, each supplement provides 350 kcal and 20 grams of protein (patient likes vanilla)  MVI    NUTRITION DIAGNOSIS:   Increased nutrient needs related to acute illness as evidenced by estimated needs.    GOAL:   Patient will meet greater than or equal to 90% of their needs   MONITOR:   PO intake, Supplement acceptance, Skin, I & O's, Labs  REASON FOR ASSESSMENT:   Consult Assessment of nutrition requirement/status  ASSESSMENT:  84 year old patient with known history of HTN, breast ca s/p rt mastectomy, who presented to ED after a fall with acute rhabdomyolysis secondary to being on the floor x 4 days, and hypokalemia, and A-fib   Nurse giving medications and patient care at visit. Patient reports eating almost all of there peach yogurt, blueberry muffin, boiled egg, banana, and coffee for breakfast. RD inquired about home intake and patients stated "I eat too good; cant you tell"? She reports eating 2 meals/day and snacking. Patient recalled broccoli casserole and shared with RD the recipe in detail and RD unable to obtain full dietary recall.   Pt shared that she lost her husband November 23rd at the age of 61. He entered a SNF last July and she was going to the nursing home 2x/week to visit him where she reports losing approximately 10lbs d/t stress.   Patient denies chewing/swallowing difficulties and reports mild nausea and dizziness yesterday morning when PT was moving her to a chair.   Medications reviewed and include: vit D3, colace, ferrous sulfate, MVI, vit B12  Sodium bicarbonate 114mEq in NaCl 0.45% @ 144mL/hr  Labs: hypokalemia - resolved Glucose 126 (h) BUN 39 (H) Cr 2.23 (H) Phos 5.2 (H)   NUTRITION - FOCUSED PHYSICAL EXAM:    Most Recent Value  Orbital Region  Mild depletion [pt with brusing/swelling to face s/p fall]  Upper Arm Region  Mild  depletion  Thoracic and Lumbar Region  Unable to assess  Buccal Region  Mild depletion  Temple Region  Mild depletion  Clavicle Bone Region  No depletion  Clavicle and Acromion Bone Region  Unable to assess  Scapular Bone Region  Unable to assess  Dorsal Hand  Mild depletion  Patellar Region  Mild depletion  Anterior Thigh Region  No depletion  Posterior Calf Region  Mild depletion  Edema (RD Assessment)  Mild [RLE,  non-pitting,  LLE,  +1]  Hair  Reviewed  Eyes  Reviewed [bruising/swelling to face]  Mouth  Reviewed  Skin  Reviewed [ecchymosis]  Nails  Reviewed       Diet Order:  85% of 3 recorded meals Diet Order            Diet Heart Room service appropriate? Yes; Fluid consistency: Thin  Diet effective now              EDUCATION NEEDS:   No education needs have been identified at this time  Skin:  Skin Assessment: Reviewed RN Assessment(abrasion; ecchymosis; BL: Face; arm; hip)  Last BM:  04/21/2018 Type 6; Owens Shark, Small  Height:   Ht Readings from Last 1 Encounters:  04/20/18 5\' 1"  (1.549 m)    Weight:   Wt Readings from Last 1 Encounters:  04/21/18 91.8 kg    Ideal Body Weight:  50 kg  BMI:  Body mass index is 38.22 kg/m.  Estimated Nutritional Needs:   Kcal:  1550-1800  Protein:  65-75g (1.3-1.5g/kg IBW)  Fluid:  </=1.8L/day    Lajuan Lines, RD, LDN  After Hours/Weekend Pager: [phone]

## 2018-04-22 NOTE — NC FL2 (Signed)
[place] LEVEL OF CARE SCREENING TOOL     IDENTIFICATION  Patient Name: Destiny Oliver Birthdate: December 25, 1926 Sex: female Admission Date (Current Location): 04/18/2018  [facility] and [place] Number:  Engineering geologist and Address:  [facility], [address] [place], [place] [address]      Provider Number: [id number]  Attending Physician Name and Address:  Gorden Harms, MD  Relative Name and Phone Number:       Current Level of Care: Hospital Recommended Level of Care: [place] Prior Approval Number:    Date Approved/Denied:   PASRR Number:    Discharge Plan: SNF    Current Diagnoses: Patient Active Problem List   Diagnosis Date Noted  . A-fib ([place]) 04/18/2018    Orientation RESPIRATION BLADDER Height & Weight     Self, Time, Situation, Place  O2(2 Liters Oxygen. ) Continent Weight: 202 lb 4.8 oz (91.8 kg) Height:  5\' 1"  (154.9 cm)  BEHAVIORAL SYMPTOMS/MOOD NEUROLOGICAL BOWEL NUTRITION STATUS      Continent Diet(Diet: Heart Healthy )  AMBULATORY STATUS COMMUNICATION OF NEEDS Skin   Extensive Assist Verbally Normal                       Personal Care Assistance Level of Assistance  Bathing, Feeding, Dressing Bathing Assistance: Limited assistance Feeding assistance: Independent Dressing Assistance: Limited assistance     Functional Limitations Info  Sight, Hearing, Speech Sight Info: Adequate Hearing Info: Impaired Speech Info: Adequate    SPECIAL CARE FACTORS FREQUENCY  PT (By licensed PT), OT (By licensed OT)     PT Frequency: (5) OT Frequency: (5)            Contractures      Additional Factors Info  Code Status, Allergies Code Status Info: (DNR ) Allergies Info: (Penicillins)           Current Medications (04/22/2018):  This is the current hospital active medication list Current Facility-Administered Medications  Medication Dose Route Frequency Provider Last Rate  Last Dose  . acetaminophen (TYLENOL) tablet 650 mg  650 mg Oral Q6H PRN Gladstone Lighter, MD       Or  . acetaminophen (TYLENOL) suppository 650 mg  650 mg Rectal Q6H PRN Gladstone Lighter, MD      . amiodarone (PACERONE) tablet 400 mg  400 mg Oral BID Corey Skains, MD   400 mg at 04/22/18 1048  . apixaban (ELIQUIS) tablet 2.5 mg  2.5 mg Oral BID Dallie Piles, RPH   2.5 mg at 04/22/18 1048  . aspirin EC tablet 81 mg  81 mg Oral Daily Gladstone Lighter, MD   81 mg at 04/22/18 1049  . cholecalciferol (VITAMIN D3) tablet 1,000 Units  1,000 Units Oral Daily Gladstone Lighter, MD   1,000 Units at 04/22/18 1047  . cyclobenzaprine (FLEXERIL) tablet 5 mg  5 mg Oral TID PRN Salary, Montell D, MD      . diltiazem (CARDIZEM CD) 24 hr capsule 240 mg  240 mg Oral Daily Corey Skains, MD   240 mg at 04/22/18 1048  . docusate sodium (COLACE) capsule 100 mg  100 mg Oral BID Gladstone Lighter, MD   100 mg at 04/22/18 1048  . feeding supplement (ENSURE ENLIVE) (ENSURE ENLIVE) liquid 237 mL  237 mL Oral BID BM Salary, Montell D, MD      . ferrous sulfate tablet 325 mg  325 mg Oral Daily Gladstone Lighter, MD  325 mg at 04/22/18 1049  . hydrALAZINE (APRESOLINE) tablet 100 mg  100 mg Oral Daily Gladstone Lighter, MD   100 mg at 04/20/18 1053  . lidocaine (LIDODERM) 5 % 1 patch  1 patch Transdermal Q24H Flora Lipps, MD   1 patch at 04/22/18 1050  . MEDLINE mouth rinse  15 mL Mouth Rinse BID Salary, Montell D, MD   15 mL at 04/22/18 1050  . metoprolol succinate (TOPROL-XL) 24 hr tablet 100 mg  100 mg Oral BID Gladstone Lighter, MD   100 mg at 04/22/18 1048  . multivitamin with minerals tablet 1 tablet  1 tablet Oral Daily Gladstone Lighter, MD   1 tablet at 04/22/18 1047  . ondansetron (ZOFRAN) tablet 4 mg  4 mg Oral Q6H PRN Gladstone Lighter, MD       Or  . ondansetron (ZOFRAN) injection 4 mg  4 mg Intravenous Q6H PRN Gladstone Lighter, MD   4 mg at 04/21/18 1741  . oxyCODONE-acetaminophen  (PERCOCET/ROXICET) 5-325 MG per tablet 1 tablet  1 tablet Oral Q8H PRN Gorden Harms, MD   1 tablet at 04/21/18 2057  . polyethylene glycol (MIRALAX / GLYCOLAX) packet 17 g  17 g Oral Daily PRN Gladstone Lighter, MD   17 g at 04/19/18 2338  . pravastatin (PRAVACHOL) tablet 40 mg  40 mg Oral Thom Chimes, MD   40 mg at 04/20/18 1856  . simethicone (MYLICON) chewable tablet 80 mg  80 mg Oral QID PRN Sedalia Muta, MD   80 mg at 04/20/18 2135  . sodium bicarbonate 150 mEq in sodium chloride 0.45 % 1,000 mL infusion   Intravenous Continuous Salary, Montell D, MD 100 mL/hr at 04/22/18 0071    . vitamin B-12 (CYANOCOBALAMIN) tablet 1,000 mcg  1,000 mcg Oral Daily Gladstone Lighter, MD   1,000 mcg at 04/22/18 1048     Discharge Medications: Please see discharge summary for a list of discharge medications.  Relevant Imaging Results:  Relevant Lab Results:   Additional Information (SSN: [id number])  Theressa Piedra, Veronia Beets, LCSW

## 2018-04-22 NOTE — Plan of Care (Signed)
  Problem: Elimination: Goal: Will not experience complications related to urinary retention Outcome: Progressing Note:  External catheter in place   Problem: Pain Managment: Goal: General experience of comfort will improve Outcome: Progressing Note:  Complaints of back pain, treated once with percocet, which decreased pain level    Problem: Safety: Goal: Ability to remain free from injury will improve Outcome: Progressing   Problem: Education: Goal: Knowledge of General Education information will improve Description Including pain rating scale, medication(s)/side effects and non-pharmacologic comfort measures Outcome: Completed/Met   Problem: Nutrition: Goal: Adequate nutrition will be maintained Outcome: Completed/Met   Problem: Coping: Goal: Level of anxiety will decrease Outcome: Not Applicable

## 2018-04-22 NOTE — Care Management Important Message (Signed)
Copy of signed Medicare IM left with patient in room. 

## 2018-04-22 NOTE — Progress Notes (Signed)
[facility] [place] NAME: Destiny Oliver    MR#:  [id number]  DATE OF BIRTH:  28-Nov-1926  SUBJECTIVE:  Patient feeling better, no complaints, no events overnight, noted worsening renal function-nephrology to see  REVIEW OF SYSTEMS:  CONSTITUTIONAL: No fever, fatigue or weakness.  EYES: No blurred or double vision.  EARS, NOSE, AND THROAT: No tinnitus or ear pain.  RESPIRATORY: No cough, shortness of breath, wheezing or hemoptysis.  CARDIOVASCULAR: No chest pain, orthopnea, edema.  GASTROINTESTINAL: No nausea, vomiting, diarrhea or abdominal pain.  GENITOURINARY: No dysuria, hematuria.  ENDOCRINE: No polyuria, nocturia,  HEMATOLOGY: No anemia, easy bruising or bleeding SKIN: No rash or lesion. MUSCULOSKELETAL: No joint pain or arthritis.   NEUROLOGIC: No tingling, numbness, weakness.  PSYCHIATRY: No anxiety or depression.   ROS  DRUG ALLERGIES:   Allergies  Allergen Reactions  . Penicillins Swelling    VITALS:  Blood pressure (!) 151/83, pulse 63, temperature 97.8 F (36.6 C), temperature source Oral, resp. rate 19, height 5\' 1"  (1.549 m), weight 91.8 kg, SpO2 97 %.  PHYSICAL EXAMINATION:  GENERAL:  84 y.o.-year-old patient lying in the bed with no acute distress.  EYES: Pupils equal, round, reactive to light and accommodation. No scleral icterus. Extraocular muscles intact.  HEENT: Head atraumatic, normocephalic. Oropharynx and nasopharynx clear.  NECK:  Supple, no jugular venous distention. No thyroid enlargement, no tenderness.  LUNGS: Normal breath sounds bilaterally, no wheezing, rales,rhonchi or crepitation. No use of accessory muscles of respiration.  CARDIOVASCULAR: S1, S2 normal. No murmurs, rubs, or gallops.  ABDOMEN: Soft, nontender, nondistended. Bowel sounds present. No organomegaly or mass.  EXTREMITIES: No pedal edema, cyanosis, or clubbing.  NEUROLOGIC: Cranial nerves II through XII are intact. Muscle strength 5/5  in all extremities. Sensation intact. Gait not checked.  PSYCHIATRIC: The patient is alert and oriented x 3.  SKIN: No obvious rash, lesion, or ulcer.   Physical Exam LABORATORY PANEL:   CBC Recent Labs  Lab 04/20/18 0347  WBC 9.5  HGB 12.2  HCT 38.9  PLT 219   ------------------------------------------------------------------------------------------------------------------  Chemistries  Recent Labs  Lab 04/18/18 1128  04/20/18 0347  04/22/18 0325  NA 142   < > 137   < > 139  K 3.2*   < > 4.0   < > 4.1  CL 106   < > 111   < > 109  CO2 20*   < > 20*   < > 20*  GLUCOSE 129*   < > 141*   < > 126*  BUN 38*   < > 35*   < > 39*  CREATININE 1.21*   < > 1.12*   < > 2.23*  CALCIUM 9.6   < > 8.4*   < > 8.3*  MG 2.0   < > 2.4  --   --   AST 119*  --   --   --   --   ALT 77*  --   --   --   --   ALKPHOS 75  --   --   --   --   BILITOT 1.4*  --   --   --   --    < > = values in this interval not displayed.   ------------------------------------------------------------------------------------------------------------------  Cardiac Enzymes Recent Labs  Lab 04/18/18 2327 04/19/18 0527  TROPONINI 0.24* 0.16*   ------------------------------------------------------------------------------------------------------------------  RADIOLOGY:  US Renal  Result Date: 04/21/2018 CLINICAL DATA:  Acute kidney injury,  hypertension EXAM: RENAL / URINARY TRACT ULTRASOUND COMPLETE COMPARISON:  None. FINDINGS: Right Kidney: Renal measurements: 9.5 x 5.3 x 4.5 cm = volume: 121 mL. Normal echogenicity and cortical thickness. Mild pelviectasis. No definite hydronephrosis. Two renal cysts noted in the upper and midpole regions, largest measures 2.3 cm. Left Kidney: Renal measurements: 10.4 x 5.5 x 6.9 cm = volume: 204 mL. Normal echogenicity and cortical thickness. No hydronephrosis. Scattered hypoechoic renal cortical cyst in the upper and lower poles, largest measures 2.4 cm. Bladder: Appears  normal for degree of bladder distention. IMPRESSION: Bilateral renal cysts. No significant or acute finding by ultrasound. Electronically Signed   By: Jerilynn Mages.  Shick M.D.   On: 04/21/2018 11:28    ASSESSMENT AND PLAN:  84 year old female with a history of breast cancer status post right breast mastectomy and hypertension who presented to the hospital after a fall.  *Acute rhabdomyolysis due to fall Resolving with IV fluids for rehydration  *Acute kidney injury Likely secondary to above as well as dehydration Discontinued lisinopril/hydrochlorothiazide, IV fluids with bicarb, avoid nephrotoxic agents, BMP in the morning, neurology consulted for expert opinion Renal ultrasound was unimpressive  *Acute elevated troponin Secondary to demand ischemia Patient has ruled out for ACS  *Atrial fibrillation with RVR Dr. Kowalski/cardiology input appreciated, continue amiodarone, Eliquis, diltiazem 240 mg daily, and beta-blocker therapy  *Essential hypertension Stable Lisinopril/hydrochlorothiazide discontinued due to acute kidney injury/dehydration, continue metoprolol, diltiazem  Disposition to skilled nursing facility when bed is available, the family would like her to go to [facility] if possible   All the records are reviewed and case discussed with Care Management/Social Workerr. Management plans discussed with the patient, family and they are in agreement.  CODE STATUS: dnr  TOTAL TIME TAKING CARE OF THIS PATIENT: 30 minutes.     POSSIBLE D/C IN 1-2 DAYS, DEPENDING ON CLINICAL CONDITION.   [name] Salary M.D on 04/22/2018   Between 7am to 6pm - Pager - [phone]  After 6pm go to [email or web address] - password [email or web address] [facility]  Office  [phone]  CC: Primary care physician; Juluis Pitch, MD  Note: This dictation was prepared with Dragon dictation along with smaller phrase technology. Any transcriptional errors that result from this process are  unintentional.

## 2018-04-22 NOTE — Clinical Social Work Note (Signed)
Clinical Social Work Assessment  Patient Details  Name: LEASIA SWANN MRN: [id number] Date of Birth: 08/25/1926  Date of referral:  04/22/18               Reason for consult:  Facility Placement                Permission sought to share information with:  Chartered certified accountant granted to share information::  Yes, Verbal Permission Granted  Name::      [place]::   [place]   Relationship::     Contact Information:     Housing/Transportation Living arrangements for the past 2 months:  [place] of Information:  Patient, Adult Children, Power of Attorney Patient Interpreter Needed:  None Criminal Activity/Legal Involvement Pertinent to Current Situation/Hospitalization:  No - Comment as needed Significant Relationships:  Adult Children Lives with:  Self Do you feel safe going back to the place where you live?  Yes Need for family participation in patient care:  Yes (Comment)  Care giving concerns:  Patient lives alone in [place].    Social Worker assessment / plan:  Holiday representative (CSW) received verbal consult from MD that PT is recommending SNF. CSW met with patient and her daughter in law Butch Penny was at bedside. Patient was alert and oriented X4 and was laying in the bed. CSW introduced self and explained role of CSW department. Per patient she lives alone in [place] and wants to go to [facility]. CSW explained that medicare requires a 3 night qualifying inpatient stay in a hospital in order to pay for SNF. Patient was admitted to inpatient 04/18/18. Patient verbalized her understanding and is agreeable to SNF search in [place]. FL2 complete and faxed out. CSW also contacted patient's friend Opal Sidles and made her aware of above. Per Opal Sidles she is patient's HPOA and Financial POA. Per Thomasene Ripple is patient's step son and his wife is Butch Penny. Opal Sidles is agreeable to SNF search in [facility] as well. CSW will  continue to follow and assist as needed.   Employment status:  Retired Forensic scientist:  [employer] PT Recommendations:  [place] / Referral to community resources:  [place]  Patient/Family's Response to care:  Patient is agreeable to [facility] in [place].   Patient/Family's Understanding of and Emotional Response to Diagnosis, Current Treatment, and Prognosis:  Patient was very pleasant and thanked CSW for assistance.   Emotional Assessment Appearance:  Appears stated age Attitude/Demeanor/Rapport:    Affect (typically observed):  Accepting, Adaptable, Pleasant Orientation:  Oriented to Self, Oriented to Place, Oriented to  Time, Oriented to Situation Alcohol / Substance use:  Not Applicable Psych involvement (Current and /or in the community):  No (Comment)  Discharge Needs  Concerns to be addressed:  Discharge Planning Concerns Readmission within the last 30 days:  No Current discharge risk:  Dependent with Mobility Barriers to Discharge:  Continued Medical Work up   [employer], Veronia Beets, LCSW 04/22/2018, 4:51 PM

## 2018-04-22 NOTE — Clinical Social Work Placement (Signed)
   CLINICAL SOCIAL WORK PLACEMENT  NOTE  Date:  04/22/2018  Patient Details  Name: Destiny Oliver MRN: [id number] Date of Birth: 05-Nov-1926  Clinical Social Work is seeking post-discharge placement for this patient at the [place] level of care (*CSW will initial, date and re-position this form in  chart as items are completed):  Yes   Patient/family provided with [place] Work Department's list of facilities offering this level of care within the geographic area requested by the patient (or if unable, by the patient's family).  Yes   Patient/family informed of their freedom to choose among providers that offer the needed level of care, that participate in Medicare, Medicaid or managed care program needed by the patient, have an available bed and are willing to accept the patient.  Yes   Patient/family informed of [facility]'s ownership interest in [facility] and [facility], as well as of the fact that they are under no obligation to receive care at these facilities.  PASRR submitted to EDS on (CSW was not able to submit PASARR because patient's SSN was already in [place]. [place] must representative is getting that fixed. )     PASRR number received on       Existing PASRR number confirmed on       FL2 transmitted to all facilities in geographic area requested by pt/family on 04/22/18     FL2 transmitted to all facilities within larger geographic area on       Patient informed that his/her managed care company has contracts with or will negotiate with certain facilities, including the following:            Patient/family informed of bed offers received.  Patient chooses bed at       Physician recommends and patient chooses bed at      Patient to be transferred to   on  .  Patient to be transferred to facility by       Patient family notified on   of transfer.  Name of family member notified:        PHYSICIAN       Additional  Comment:    _______________________________________________ Collin Hendley, Veronia Beets, LCSW 04/22/2018, 4:50 PM

## 2018-04-22 NOTE — Progress Notes (Signed)
Pharmacy Electrolyte Monitoring Consult:  Pharmacy consulted to assist in monitoring and replacing electrolytes in this 84 y.o. female admitted on 04/18/2018 with Fall   Labs:  Sodium (mmol/L)  Date Value  04/22/2018 139  06/02/2012 143   Potassium (mmol/L)  Date Value  04/22/2018 4.1  06/02/2012 3.4 (L)   Magnesium (mg/dL)  Date Value  04/20/2018 2.4   Phosphorus (mg/dL)  Date Value  04/22/2018 5.2 (H)   Calcium (mg/dL)  Date Value  04/22/2018 8.3 (L)   Calcium, Total (mg/dL)  Date Value  06/02/2012 9.2   Albumin (g/dL)  Date Value  04/18/2018 4.0  06/02/2012 3.9    Assessment/Plan: K 4.1 Phos 5.2  Scr 2.33  No supplementation at this time. Note that Scr continuing to increase. Will replace for goal potassium ~4 and goal magnesium ~2.   Will obtain follow up electrolytes with am labs.   Pharmacy will continue to monitor and adjust per consult.   Paulina Fusi, PharmD, BCPS 04/22/2018 9:17 AM

## 2018-04-22 NOTE — Progress Notes (Signed)
[facility]  ROUNDING NOTE   Subjective:  Patient well-known to [place] as we follow her for management of chronic kidney disease stage IV as an outpatient. She experienced a fall and was on the floor for approximately 4 days. She was found to have rhabdomyolysis upon admission. We follow her for chronic kidney disease as above and her baseline creatinine is 1.76. Creatinine currently up to 2.2.  Objective:  Vital signs in last 24 hours:  Temp:  [97.8 F (36.6 C)-98.6 F (37 C)] 97.8 F (36.6 C) (12/23 0747) Pulse Rate:  [57-83] 63 (12/23 0747) Resp:  [19-20] 19 (12/23 0747) BP: (120-151)/(67-83) 151/83 (12/23 0747) SpO2:  [89 %-97 %] 97 % (12/23 0747)  Weight change:  Filed Weights   04/19/18 0907 04/20/18 1713 04/21/18 0327  Weight: 86.8 kg 91.9 kg 91.8 kg    Intake/Output: I/O last 3 completed shifts: In: 2677.3 [P.O.:600; I.V.:2077.3] Out: 650 [Urine:650]   Intake/Output this shift:  No intake/output data recorded.  Physical Exam: General: No acute distress  Head: Facial bruising noted  Eyes: Anicteric  Neck: Supple, trachea midline  Lungs:  Clear to auscultation, normal effort  Heart: S1S2 no rubs  Abdomen:  Soft, nontender, bowel sounds present  Extremities: trace peripheral edema.  Neurologic: Awake, alert, following commands  Skin: Scattered ecchymoses noted       Basic Metabolic Panel: Recent Labs  Lab 04/18/18 1128 04/19/18 0527 04/20/18 0347 04/20/18 0558 04/21/18 0331 04/22/18 0325  NA 142 137 137  --  139 139  K 3.2* 3.6 4.0  --  4.5 4.1  CL 106 109 111  --  111 109  CO2 20* 21* 20*  --  21* 20*  GLUCOSE 129* 135* 141*  --  125* 126*  BUN 38* 39* 35*  --  39* 39*  CREATININE 1.21* 1.23* 1.12*  --  2.07* 2.23*  CALCIUM 9.6 8.5* 8.4*  --  8.4* 8.3*  MG 2.0 1.8 2.4  --   --   --   PHOS  --  2.4*  --  2.2* 4.8* 5.2*    Liver Function Tests: Recent Labs  Lab 04/18/18 1128  AST 119*  ALT 77*  ALKPHOS 75  BILITOT 1.4*  PROT  7.2  ALBUMIN 4.0   No results for input(s): LIPASE, AMYLASE in the last 168 hours. No results for input(s): AMMONIA in the last 168 hours.  CBC: Recent Labs  Lab 04/18/18 1128 04/18/18 1830 04/19/18 0527 04/20/18 0347  WBC 11.4*  --  10.3 9.5  NEUTROABS 9.1*  --   --   --   HGB 15.1* 13.5 12.4 12.2  HCT 47.1*  --  38.7 38.9  MCV 88.9  --  88.4 90.5  PLT 280  --  220 219    Cardiac Enzymes: Recent Labs  Lab 04/18/18 1128 04/18/18 1830 04/18/18 2327 04/19/18 0527 04/20/18 0558  CKTOTAL 1,767*  --   --  474* 307*  TROPONINI 0.20* 0.25* 0.24* 0.16*  --     BNP: Invalid input(s): POCBNP  CBG: Recent Labs  Lab 04/19/18 0859  GLUCAP 131*    Microbiology: Results for orders placed or performed during the hospital encounter of 04/18/18  MRSA PCR Screening     Status: None   Collection Time: 04/19/18  9:09 AM  Result Value Ref Range Status   MRSA by PCR NEGATIVE NEGATIVE Final    Comment:        The GeneXpert MRSA Assay (FDA approved for NASAL  specimens only), is one component of a comprehensive MRSA colonization surveillance program. It is not intended to diagnose MRSA infection nor to guide or monitor treatment for MRSA infections. Performed at [facility], [address] [place], [place] [address]     Coagulation Studies: Recent Labs    04/20/18 0558  LABPROT 14.1  INR 1.10    Urinalysis: No results for input(s): COLORURINE, LABSPEC, PHURINE, GLUCOSEU, HGBUR, BILIRUBINUR, KETONESUR, PROTEINUR, UROBILINOGEN, NITRITE, LEUKOCYTESUR in the last 72 hours.  Invalid input(s): APPERANCEUR    Imaging: US Renal  Result Date: 04/21/2018 CLINICAL DATA:  Acute kidney injury, hypertension EXAM: RENAL / URINARY TRACT ULTRASOUND COMPLETE COMPARISON:  None. FINDINGS: Right Kidney: Renal measurements: 9.5 x 5.3 x 4.5 cm = volume: 121 mL. Normal echogenicity and cortical thickness. Mild pelviectasis. No definite hydronephrosis. Two renal cysts noted  in the upper and midpole regions, largest measures 2.3 cm. Left Kidney: Renal measurements: 10.4 x 5.5 x 6.9 cm = volume: 204 mL. Normal echogenicity and cortical thickness. No hydronephrosis. Scattered hypoechoic renal cortical cyst in the upper and lower poles, largest measures 2.4 cm. Bladder: Appears normal for degree of bladder distention. IMPRESSION: Bilateral renal cysts. No significant or acute finding by ultrasound. Electronically Signed   By: Jerilynn Mages.  Shick M.D.   On: 04/21/2018 11:28     Medications:   .  sodium bicarbonate  infusion 1000 mL 100 mL/hr at 04/22/18 5361   . amiodarone  400 mg Oral BID  . apixaban  2.5 mg Oral BID  . aspirin EC  81 mg Oral Daily  . cholecalciferol  1,000 Units Oral Daily  . diltiazem  240 mg Oral Daily  . docusate sodium  100 mg Oral BID  . ferrous sulfate  325 mg Oral Daily  . hydrALAZINE  100 mg Oral Daily  . lidocaine  1 patch Transdermal Q24H  . mouth rinse  15 mL Mouth Rinse BID  . metoprolol  100 mg Oral BID  . multivitamin with minerals  1 tablet Oral Daily  . pravastatin  40 mg Oral QODAY  . cyanocobalamin  1,000 mcg Oral Daily   acetaminophen **OR** acetaminophen, cyclobenzaprine, ondansetron **OR** ondansetron (ZOFRAN) IV, oxyCODONE-acetaminophen, polyethylene glycol, simethicone  Assessment/ Plan:  84 y.o. female with hypertension, hyperlipidemia, GERD, breast cancer status post right mastectomy, cataracts, asthma, hemorrhoids, diabetes mellitus type 2, anemia of CKD.   1.  Acute renal failure/CKD stage IV baseline Cr 1.76. 2.  Rhabdomyolysis s/p prolonged fall. 3.  Hypertension.  Plan: The patient was admitted for a fall.  She was on the ground for prolonged period of time.  Continue IV fluid hydration with sodium bicarbonate for now.  Normal saline would also be an acceptable alternative.  Ultrasound negative for hydronephrosis.  No indication for dialysis at the moment.  Avoid nephrotoxins as possible.  Thanks for consultation.    LOS: 4 Leshia Kope 12/23/20191:06 PM

## 2018-04-23 LAB — BASIC METABOLIC PANEL
Anion gap: 8 (ref 5–15)
BUN: 39 mg/dL — ABNORMAL HIGH (ref 8–23)
CO2: 25 mmol/L (ref 22–32)
Calcium: 8.2 mg/dL — ABNORMAL LOW (ref 8.9–10.3)
Chloride: 107 mmol/L (ref 98–111)
Creatinine, Ser: 2.29 mg/dL — ABNORMAL HIGH (ref 0.44–1.00)
GFR calc Af Amer: 21 mL/min — ABNORMAL LOW (ref >60)
GFR calc non Af Amer: 18 mL/min — ABNORMAL LOW (ref >60)
Glucose, Bld: 128 mg/dL — ABNORMAL HIGH (ref 70–99)
Potassium: 4.1 mmol/L (ref 3.5–5.1)
Sodium: 140 mmol/L (ref 135–145)

## 2018-04-23 MED ORDER — SODIUM CHLORIDE 0.9% FLUSH
10.0000 mL | Freq: Two times a day (BID) | INTRAVENOUS | Status: DC
  Administered 2018-04-24 – 2018-04-29 (×10): 10 mL via INTRAVENOUS

## 2018-04-23 MED ORDER — SODIUM CHLORIDE 0.9 % IV SOLN
INTRAVENOUS | Status: DC
  Administered 2018-04-23 – 2018-04-24 (×4): via INTRAVENOUS

## 2018-04-23 NOTE — Progress Notes (Signed)
Pharmacy Electrolyte Monitoring Consult:  Pharmacy consulted to assist in monitoring and replacing electrolytes in this 84 y.o. female admitted on 04/18/2018 with Fall   Labs:  Sodium (mmol/L)  Date Value  04/23/2018 140  06/02/2012 143   Potassium (mmol/L)  Date Value  04/23/2018 4.1  06/02/2012 3.4 (L)   Magnesium (mg/dL)  Date Value  04/20/2018 2.4   Phosphorus (mg/dL)  Date Value  04/22/2018 5.2 (H)   Calcium (mg/dL)  Date Value  04/23/2018 8.2 (L)   Calcium, Total (mg/dL)  Date Value  06/02/2012 9.2   Albumin (g/dL)  Date Value  04/18/2018 4.0  06/02/2012 3.9    Assessment/Plan: K 4.1  Scr 2.29  No supplementation at this time. Baseline Scr ~1.2. Will replace for goal potassium ~4 and goal magnesium ~2.   Will obtain follow up electrolytes with am labs.   Pharmacy will continue to monitor and adjust per consult.   Paticia Stack, PharmD Pharmacy Resident  04/23/2018 8:41 AM

## 2018-04-23 NOTE — Clinical Social Work Placement (Signed)
   CLINICAL SOCIAL WORK PLACEMENT  NOTE  Date:  04/23/2018  Patient Details  Name: KLARA STJAMES MRN: [id number] Date of Birth: 1927/02/20  Clinical Social Work is seeking post-discharge placement for this patient at the [place] level of care (*CSW will initial, date and re-position this form in  chart as items are completed):  Yes   Patient/family provided with [place] Work Department's list of facilities offering this level of care within the geographic area requested by the patient (or if unable, by the patient's family).  Yes   Patient/family informed of their freedom to choose among providers that offer the needed level of care, that participate in Medicare, Medicaid or managed care program needed by the patient, have an available bed and are willing to accept the patient.  Yes   Patient/family informed of [facility]'s ownership interest in [facility] and [facility], as well as of the fact that they are under no obligation to receive care at these facilities.  PASRR submitted to EDS on 04/23/18( )     PASRR number received on 04/23/18     Existing PASRR number confirmed on       FL2 transmitted to all facilities in geographic area requested by pt/family on 04/22/18     FL2 transmitted to all facilities within larger geographic area on       Patient informed that his/her managed care company has contracts with or will negotiate with certain facilities, including the following:            Patient/family informed of bed offers received.  Patient chooses bed at       Physician recommends and patient chooses bed at      Patient to be transferred to   on  .  Patient to be transferred to facility by       Patient family notified on   of transfer.  Name of family member notified:        PHYSICIAN       Additional Comment:    _______________________________________________ Briona Korpela, Veronia Beets, LCSW 04/23/2018, 8:58 AM

## 2018-04-23 NOTE — Progress Notes (Signed)
[facility]  ROUNDING NOTE   Subjective:  Creatinine stable at 2.2. Urine output 1.8 L over the preceding 24 hours.   Objective:  Vital signs in last 24 hours:  Temp:  [97.7 F (36.5 C)-98.6 F (37 C)] 97.7 F (36.5 C) (12/24 0727) Pulse Rate:  [68-79] 73 (12/24 0727) Resp:  [18-19] 18 (12/24 0727) BP: (145-158)/(79-85) 145/85 (12/24 0727) SpO2:  [90 %-97 %] 93 % (12/24 0727) Weight:  [96 kg] 96 kg (12/24 0423)  Weight change:  Filed Weights   04/20/18 1713 04/21/18 0327 04/23/18 0423  Weight: 91.9 kg 91.8 kg 96 kg    Intake/Output: I/O last 3 completed shifts: In: 4693.6 [P.O.:237; I.V.:4456.6] Out: 2400 [Urine:2400]   Intake/Output this shift:  Total I/O In: 240 [P.O.:240] Out: 400 [Urine:400]  Physical Exam: General: No acute distress  Head: Facial bruising noted  Eyes: Anicteric  Neck: Supple, trachea midline  Lungs:  Clear to auscultation, normal effort  Heart: S1S2 no rubs  Abdomen:  Soft, nontender, bowel sounds present  Extremities: trace peripheral edema.  Neurologic: Awake, alert, following commands  Skin: Scattered ecchymoses noted       Basic Metabolic Panel: Recent Labs  Lab 04/18/18 1128 04/19/18 0527 04/20/18 0347 04/20/18 0558 04/21/18 0331 04/22/18 0325 04/23/18 0302  NA 142 137 137  --  139 139 140  K 3.2* 3.6 4.0  --  4.5 4.1 4.1  CL 106 109 111  --  111 109 107  CO2 20* 21* 20*  --  21* 20* 25  GLUCOSE 129* 135* 141*  --  125* 126* 128*  BUN 38* 39* 35*  --  39* 39* 39*  CREATININE 1.21* 1.23* 1.12*  --  2.07* 2.23* 2.29*  CALCIUM 9.6 8.5* 8.4*  --  8.4* 8.3* 8.2*  MG 2.0 1.8 2.4  --   --   --   --   PHOS  --  2.4*  --  2.2* 4.8* 5.2*  --     Liver Function Tests: Recent Labs  Lab 04/18/18 1128  AST 119*  ALT 77*  ALKPHOS 75  BILITOT 1.4*  PROT 7.2  ALBUMIN 4.0   No results for input(s): LIPASE, AMYLASE in the last 168 hours. No results for input(s): AMMONIA in the last 168 hours.  CBC: Recent  Labs  Lab 04/18/18 1128 04/18/18 1830 04/19/18 0527 04/20/18 0347  WBC 11.4*  --  10.3 9.5  NEUTROABS 9.1*  --   --   --   HGB 15.1* 13.5 12.4 12.2  HCT 47.1*  --  38.7 38.9  MCV 88.9  --  88.4 90.5  PLT 280  --  220 219    Cardiac Enzymes: Recent Labs  Lab 04/18/18 1128 04/18/18 1830 04/18/18 2327 04/19/18 0527 04/20/18 0558  CKTOTAL 1,767*  --   --  474* 307*  TROPONINI 0.20* 0.25* 0.24* 0.16*  --     BNP: Invalid input(s): POCBNP  CBG: Recent Labs  Lab 04/19/18 0859  GLUCAP 131*    Microbiology: Results for orders placed or performed during the hospital encounter of 04/18/18  MRSA PCR Screening     Status: None   Collection Time: 04/19/18  9:09 AM  Result Value Ref Range Status   MRSA by PCR NEGATIVE NEGATIVE Final    Comment:        The GeneXpert MRSA Assay (FDA approved for NASAL specimens only), is one component of a comprehensive MRSA colonization surveillance program. It is not intended to diagnose  MRSA infection nor to guide or monitor treatment for MRSA infections. Performed at [facility], [address] [place], [place] [address]     Coagulation Studies: No results for input(s): LABPROT, INR in the last 72 hours.  Urinalysis: No results for input(s): COLORURINE, LABSPEC, PHURINE, GLUCOSEU, HGBUR, BILIRUBINUR, KETONESUR, PROTEINUR, UROBILINOGEN, NITRITE, LEUKOCYTESUR in the last 72 hours.  Invalid input(s): APPERANCEUR    Imaging: No results found.   Medications:   . sodium chloride 100 mL/hr at 04/23/18 1011   . amiodarone  400 mg Oral BID  . apixaban  2.5 mg Oral BID  . aspirin EC  81 mg Oral Daily  . cholecalciferol  1,000 Units Oral Daily  . diltiazem  240 mg Oral Daily  . docusate sodium  100 mg Oral BID  . feeding supplement (ENSURE ENLIVE)  237 mL Oral BID BM  . ferrous sulfate  325 mg Oral Daily  . hydrALAZINE  100 mg Oral Daily  . lidocaine  1 patch Transdermal Q24H  . mouth rinse  15 mL Mouth Rinse BID   . metoprolol  100 mg Oral BID  . multivitamin with minerals  1 tablet Oral Daily  . pravastatin  40 mg Oral QODAY  . cyanocobalamin  1,000 mcg Oral Daily   acetaminophen **OR** acetaminophen, cyclobenzaprine, ondansetron **OR** ondansetron (ZOFRAN) IV, oxyCODONE-acetaminophen, polyethylene glycol, simethicone  Assessment/ Plan:  84 y.o. female with hypertension, hyperlipidemia, GERD, breast cancer status post right mastectomy, cataracts, asthma, hemorrhoids, diabetes mellitus type 2, anemia of CKD.   1.  Acute renal failure/CKD stage IV baseline Cr 1.76. 2.  Rhabdomyolysis s/p prolonged fall. 3.  Hypertension.  Plan: Renal function stable at the moment.  Creatinine currently 2.2.  Urine output good at 1.8 L.  CK now low at 307.  Continue IV fluid hydration with 0.9 normal saline at 100 cc/h.  Monitor renal parameters and urine output daily for now.   LOS: 5 Destiny Oliver 12/24/201911:19 AM

## 2018-04-23 NOTE — Progress Notes (Signed)
[place] [facility] [place] NAME: Destiny Oliver    MR#:  [id number]  DATE OF BIRTH:  1926/06/22  SUBJECTIVE:  Patient feeling better, no complaints, son at the bedside, nephrology input appreciated   REVIEW OF SYSTEMS:  CONSTITUTIONAL: No fever, fatigue or weakness.  EYES: No blurred or double vision.  EARS, NOSE, AND THROAT: No tinnitus or ear pain.  RESPIRATORY: No cough, shortness of breath, wheezing or hemoptysis.  CARDIOVASCULAR: No chest pain, orthopnea, edema.  GASTROINTESTINAL: No nausea, vomiting, diarrhea or abdominal pain.  GENITOURINARY: No dysuria, hematuria.  ENDOCRINE: No polyuria, nocturia,  HEMATOLOGY: No anemia, easy bruising or bleeding SKIN: No rash or lesion. MUSCULOSKELETAL: No joint pain or arthritis.   NEUROLOGIC: No tingling, numbness, weakness.  PSYCHIATRY: No anxiety or depression.   ROS  DRUG ALLERGIES:   Allergies  Allergen Reactions  . Penicillins Swelling    VITALS:  Blood pressure (!) 145/85, pulse 73, temperature 97.7 F (36.5 C), temperature source Oral, resp. rate 18, height 5\' 1"  (1.549 m), weight 96 kg, SpO2 93 %.  PHYSICAL EXAMINATION:  GENERAL:  84 y.o.-year-old patient lying in the bed with no acute distress.  EYES: Pupils equal, round, reactive to light and accommodation. No scleral icterus. Extraocular muscles intact.  HEENT: Head atraumatic, normocephalic. Oropharynx and nasopharynx clear.  NECK:  Supple, no jugular venous distention. No thyroid enlargement, no tenderness.  LUNGS: Normal breath sounds bilaterally, no wheezing, rales,rhonchi or crepitation. No use of accessory muscles of respiration.  CARDIOVASCULAR: S1, S2 normal. No murmurs, rubs, or gallops.  ABDOMEN: Soft, nontender, nondistended. Bowel sounds present. No organomegaly or mass.  EXTREMITIES: No pedal edema, cyanosis, or clubbing.  NEUROLOGIC: Cranial nerves II through XII are intact. Muscle strength 5/5 in all extremities.  Sensation intact. Gait not checked.  PSYCHIATRIC: The patient is alert and oriented x 3.  SKIN: No obvious rash, lesion, or ulcer.   Physical Exam LABORATORY PANEL:   CBC Recent Labs  Lab 04/20/18 0347  WBC 9.5  HGB 12.2  HCT 38.9  PLT 219   ------------------------------------------------------------------------------------------------------------------  Chemistries  Recent Labs  Lab 04/18/18 1128  04/20/18 0347  04/23/18 0302  NA 142   < > 137   < > 140  K 3.2*   < > 4.0   < > 4.1  CL 106   < > 111   < > 107  CO2 20*   < > 20*   < > 25  GLUCOSE 129*   < > 141*   < > 128*  BUN 38*   < > 35*   < > 39*  CREATININE 1.21*   < > 1.12*   < > 2.29*  CALCIUM 9.6   < > 8.4*   < > 8.2*  MG 2.0   < > 2.4  --   --   AST 119*  --   --   --   --   ALT 77*  --   --   --   --   ALKPHOS 75  --   --   --   --   BILITOT 1.4*  --   --   --   --    < > = values in this interval not displayed.   ------------------------------------------------------------------------------------------------------------------  Cardiac Enzymes Recent Labs  Lab 04/18/18 2327 04/19/18 0527  TROPONINI 0.24* 0.16*   ------------------------------------------------------------------------------------------------------------------  RADIOLOGY:  No results found.  ASSESSMENT AND PLAN:  84 year old female with a history  of breast cancer status post right breast mastectomy and hypertension who presented to the hospital after a fall.  *Acute rhabdomyolysis due to fall Resolving with IV fluids for rehydration  *Acute kidney injury Stable Likely secondary to above as well as dehydration Discontinued lisinopril/hydrochlorothiazide, continue IV fluids for rehydration, avoid nephrotoxic agents, BMP in the morning, nephrology input appreciated  Renal ultrasound was unimpressive  *Acute elevated troponin Secondary to demand ischemia Patient has ruled out for ACS  *Atrial fibrillation with RVR Dr.  Kowalski/cardiology input appreciated, continue amiodarone, Eliquis, diltiazem 240 mg daily, and beta-blocker therapy  *Essential hypertension Stable Lisinopril/hydrochlorothiazide discontinued due to acute kidney injury/dehydration, continue metoprolol, diltiazem  Disposition to skilled nursing facility when bed when renal function has improved/stabilized    All the records are reviewed and case discussed with Care Management/Social Workerr. Management plans discussed with the patient, family and they are in agreement.  CODE STATUS: dnr  TOTAL TIME TAKING CARE OF THIS PATIENT: 30 minutes.     POSSIBLE D/C IN 1-2 DAYS, DEPENDING ON CLINICAL CONDITION.   [name] M.D on 04/23/2018   Between 7am to 6pm - Pager - [phone]  After 6pm go to [email or web address] - password [email or web address] [facility]  Office  [phone]  CC: Primary care physician; Juluis Pitch, MD  Note: This dictation was prepared with Dragon dictation along with smaller phrase technology. Any transcriptional errors that result from this process are unintentional.

## 2018-04-23 NOTE — Clinical Social Work Note (Signed)
CSW presented bed offers to patient, and her Charissa Bash [phone], she chose [facility].  CSW spoke to [facility], they can accept patient on Thursday if she is medically ready for discharge and orders have been received.  Jones Broom. [place], MSW, [place]  04/23/2018 2:20 PM

## 2018-04-23 NOTE — Progress Notes (Signed)
Physical Therapy Treatment Patient Details Name: Destiny Oliver MRN: [id number] DOB: 1926/06/10 Today's Date: 04/23/2018    History of Present Illness 84 yo female with onset of a-fib and fall at home, with rhabdomyolysis, was admitted.  Cleared for facial fractures, very bruised.  Had elevated troponin but was deemed demand ischemia.  PMHx:  R breast CA, HTN, tachycardia,     PT Comments    Pt in bed this am feeling better, ready to get up.  To edge of bed with mod assist and heavy use of rail.  She was able to remain sitting x 10 minutes with supervision but without assist.  Transferred to commode with mod a x 1 and RW.  Voided but was unable to have BM and requested stool softener from nursing.  After an extended time she was able to transfer to recliner with min a x 2 and RW with generally unsteady and weak gait.  Remained in recliner after session.    Sats noted to be 94% on 1 lpm.  O2 was removed for session but sats did dip steadily to 87 despite encouragement for proper breathing and O2 was reapplied at 1 lpm.  Pt was given time but struggled to return to baseline.  It was increased temporarily to 2 lpm then returned to 1 lpm once at 94% as her baseline.  RN notified of need for continued O2.  Encourage continued monitoring of O2 with activity as she may required increased flow for activity to maintain baseline with therapy.   Follow Up Recommendations  SNF     Equipment Recommendations  Rolling walker with 5" wheels    Recommendations for Other Services       Precautions / Restrictions Precautions Precautions: Fall Precaution Comments: monitor for fall risk Restrictions Weight Bearing Restrictions: No    Mobility  Bed Mobility Overal bed mobility: Needs Assistance Bed Mobility: Supine to Sit     Supine to sit: Mod assist     General bed mobility comments: use of rail  Transfers Overall transfer level: Needs assistance Equipment used: Rolling walker (2 wheeled)   Sit to Stand: +2 physical assistance;Min assist;Min guard         General transfer comment: +2 assist for safety due to fatigue  Ambulation/Gait Ambulation/Gait assistance: Min assist;+2 physical assistance;Min guard Gait Distance (Feet): 3 Feet Assistive device: Rolling walker (2 wheeled)   Gait velocity: decreased       Stairs             Wheelchair Mobility    Modified Rankin (Stroke Patients Only)       Balance Overall balance assessment: History of Falls;Needs assistance Sitting-balance support: Feet supported;Bilateral upper extremity supported Sitting balance-Leahy Scale: Fair Sitting balance - Comments: supervision but no physical assist.   Standing balance support: Bilateral upper extremity supported;During functional activity Standing balance-Leahy Scale: Poor Standing balance comment: hand on assist at all times for safety                            Cognition Arousal/Alertness: Awake/alert Behavior During Therapy: WFL for tasks assessed/performed Overall Cognitive Status: Within Functional Limits for tasks assessed                                        Exercises Other Exercises Other Exercises: sitting unsupported x 10 minutes edge of bed.  LE AROM for ankle pumps and LAQ x 10  Other Exercises: to commode to void but no bm.  Pt requested stool softener from RN    General Comments        Pertinent Vitals/Pain Pain Assessment: No/denies pain    Home Living                      Prior Function            PT Goals (current goals can now be found in the care plan section) Progress towards PT goals: Progressing toward goals    Frequency    Min 2X/week      PT Plan Current plan remains appropriate    Co-evaluation              AM-PAC PT "6 Clicks" Mobility   Outcome Measure  Help needed turning from your back to your side while in a flat bed without using bedrails?: A Lot Help needed  moving from lying on your back to sitting on the side of a flat bed without using bedrails?: A Lot Help needed moving to and from a bed to a chair (including a wheelchair)?: A Lot Help needed standing up from a chair using your arms (e.g., wheelchair or bedside chair)?: A Lot Help needed to walk in hospital room?: A Lot Help needed climbing 3-5 steps with a railing? : Total 6 Click Score: 11    End of Session Equipment Utilized During Treatment: Gait belt Activity Tolerance: Patient limited by fatigue Patient left: in chair;with chair alarm set;with call bell/phone within reach Nurse Communication: Other (comment)       Time: 5320-2334 PT Time Calculation (min) (ACUTE ONLY): 42 min  Charges:  $Gait Training: 8-22 mins $Therapeutic Exercise: 8-22 mins $Therapeutic Activity: 8-22 mins                     Chesley Noon, PTA 04/23/18, 10:06 AM

## 2018-04-24 LAB — PHOSPHORUS: Phosphorus: 3.1 mg/dL (ref 2.5–4.6)

## 2018-04-24 LAB — MAGNESIUM: MAGNESIUM: 2 mg/dL (ref 1.7–2.4)

## 2018-04-24 LAB — BASIC METABOLIC PANEL
Anion gap: 7 (ref 5–15)
BUN: 30 mg/dL — AB (ref 8–23)
CO2: 24 mmol/L (ref 22–32)
Calcium: 8.1 mg/dL — ABNORMAL LOW (ref 8.9–10.3)
Chloride: 107 mmol/L (ref 98–111)
Creatinine, Ser: 1.56 mg/dL — ABNORMAL HIGH (ref 0.44–1.00)
GFR calc Af Amer: 34 mL/min — ABNORMAL LOW (ref >60)
GFR calc non Af Amer: 29 mL/min — ABNORMAL LOW (ref >60)
Glucose, Bld: 127 mg/dL — ABNORMAL HIGH (ref 70–99)
Potassium: 4.2 mmol/L (ref 3.5–5.1)
SODIUM: 138 mmol/L (ref 135–145)

## 2018-04-24 MED ORDER — HYDRALAZINE HCL 20 MG/ML IJ SOLN: 10.0000 mg | INTRAMUSCULAR | Status: DC | PRN

## 2018-04-24 MED ORDER — SODIUM CHLORIDE 0.45 % IV SOLN
INTRAVENOUS | Status: DC
  Administered 2018-04-24: 13:00:00 via INTRAVENOUS

## 2018-04-24 MED ORDER — DILTIAZEM HCL ER COATED BEADS 180 MG PO CP24
360.0000 mg | ORAL_CAPSULE | Freq: Every day | ORAL | Status: DC
  Administered 2018-04-25 – 2018-04-26 (×2): 360 mg via ORAL
  Filled 2018-04-24 (×2): qty 2

## 2018-04-24 MED ORDER — HYDRALAZINE HCL 50 MG PO TABS
50.0000 mg | ORAL_TABLET | Freq: Three times a day (TID) | ORAL | Status: DC
  Administered 2018-04-24 – 2018-04-26 (×5): 50 mg via ORAL
  Filled 2018-04-24 (×5): qty 1

## 2018-04-24 MED ORDER — METOPROLOL SUCCINATE ER 100 MG PO TB24
100.0000 mg | ORAL_TABLET | Freq: Two times a day (BID) | ORAL | Status: DC
  Administered 2018-04-24 – 2018-04-30 (×11): 100 mg via ORAL
  Filled 2018-04-24 (×12): qty 1

## 2018-04-24 MED ORDER — DILTIAZEM HCL ER COATED BEADS 120 MG PO CP24
120.0000 mg | ORAL_CAPSULE | Freq: Once | ORAL | Status: AC
  Administered 2018-04-24: 120 mg via ORAL
  Filled 2018-04-24: qty 1

## 2018-04-24 MED ORDER — TRAZODONE HCL 100 MG PO TABS
100.0000 mg | ORAL_TABLET | Freq: Every day | ORAL | Status: DC
  Administered 2018-04-24 – 2018-04-29 (×5): 100 mg via ORAL
  Filled 2018-04-24 (×6): qty 1

## 2018-04-24 NOTE — Progress Notes (Signed)
Destiny Oliver at [place] NAME: Destiny Oliver    MR#:  [id number]  DATE OF BIRTH:  10/10/1926  SUBJECTIVE:  Patient feeling better, complains of insomnia, noted tachycardia and hypertension this morning-we will increase Cardizem CD, change IV fluids to half-normal saline, nephrology input greatly appreciated  REVIEW OF SYSTEMS:  CONSTITUTIONAL: No fever, fatigue or weakness.  EYES: No blurred or double vision.  EARS, NOSE, AND THROAT: No tinnitus or ear pain.  RESPIRATORY: No cough, shortness of breath, wheezing or hemoptysis.  CARDIOVASCULAR: No chest pain, orthopnea, edema.  GASTROINTESTINAL: No nausea, vomiting, diarrhea or abdominal pain.  GENITOURINARY: No dysuria, hematuria.  ENDOCRINE: No polyuria, nocturia,  HEMATOLOGY: No anemia, easy bruising or bleeding SKIN: No rash or lesion. MUSCULOSKELETAL: No joint pain or arthritis.   NEUROLOGIC: No tingling, numbness, weakness.  PSYCHIATRY: No anxiety or depression.   ROS  DRUG ALLERGIES:   Allergies  Allergen Reactions  . Penicillins Swelling    VITALS:  Blood pressure (!) 174/117, pulse (!) 113, temperature 98 F (36.7 C), temperature source Oral, resp. rate 19, height 5\' 1"  (1.549 m), weight 98.3 kg, SpO2 92 %.  PHYSICAL EXAMINATION:  GENERAL:  84 y.o.-year-old patient lying in the bed with no acute distress.  EYES: Pupils equal, round, reactive to light and accommodation. No scleral icterus. Extraocular muscles intact.  HEENT: Head atraumatic, normocephalic. Oropharynx and nasopharynx clear.  NECK:  Supple, no jugular venous distention. No thyroid enlargement, no tenderness.  LUNGS: Normal breath sounds bilaterally, no wheezing, rales,rhonchi or crepitation. No use of accessory muscles of respiration.  CARDIOVASCULAR: S1, S2 normal. No murmurs, rubs, or gallops.  ABDOMEN: Soft, nontender, nondistended. Bowel sounds present. No organomegaly or mass.  EXTREMITIES: No pedal edema,  cyanosis, or clubbing.  NEUROLOGIC: Cranial nerves II through XII are intact. Muscle strength 5/5 in all extremities. Sensation intact. Gait not checked.  PSYCHIATRIC: The patient is alert and oriented x 3.  SKIN: No obvious rash, lesion, or ulcer.   Physical Exam LABORATORY PANEL:   CBC Recent Labs  Lab 04/20/18 0347  WBC 9.5  HGB 12.2  HCT 38.9  PLT 219   ------------------------------------------------------------------------------------------------------------------  Chemistries  Recent Labs  Lab 04/18/18 1128  04/24/18 0601  NA 142   < > 138  K 3.2*   < > 4.2  CL 106   < > 107  CO2 20*   < > 24  GLUCOSE 129*   < > 127*  BUN 38*   < > 30*  CREATININE 1.21*   < > 1.56*  CALCIUM 9.6   < > 8.1*  MG 2.0   < > 2.0  AST 119*  --   --   ALT 77*  --   --   ALKPHOS 75  --   --   BILITOT 1.4*  --   --    < > = values in this interval not displayed.   ------------------------------------------------------------------------------------------------------------------  Cardiac Enzymes Recent Labs  Lab 04/18/18 2327 04/19/18 0527  TROPONINI 0.24* 0.16*   ------------------------------------------------------------------------------------------------------------------  RADIOLOGY:  No results found.  ASSESSMENT AND PLAN:  84 year old female with a history of breast cancer status post right breast mastectomy and hypertension who presented to the hospital after a fall.  *Acute rhabdomyolysis due to fall Resolving with IV fluids for rehydration  *Acute kidney injury Resolving Likely secondary to above as well as dehydration Discontinued lisinopril/hydrochlorothiazide, continue IV fluids for rehydration, avoid nephrotoxic agents, BMP in the morning, nephrology input  appreciated  Renal ultrasound was unimpressive  *Acute elevated troponin Secondary to demand ischemia Patient has ruled out for ACS  *Atrial fibrillation with RVR Dr. Kowalski/cardiology input  appreciated, continue amiodarone, Eliquis, increase diltiazem to 360 mg daily, and beta-blocker therapy  *Essential hypertension Stable Lisinopril/hydrochlorothiazide discontinued due to acute kidney injury/dehydration, continue metoprolol, increase diltiazem, hydralazine as needed systolic blood pressure greater than 160  *Acute insomnia Trazodone at bedtime  Disposition to skilled nursing facility when bed when renal function has improved/stabilized    All the records are reviewed and case discussed with Care Management/Social Workerr. Management plans discussed with the patient, family and they are in agreement.  CODE STATUS: dnr  TOTAL TIME TAKING CARE OF THIS PATIENT: 30 minutes.     POSSIBLE D/C IN 1-2 DAYS, DEPENDING ON CLINICAL CONDITION.   [name] Susanne Baumgarner M.D on 04/24/2018   Between 7am to 6pm - Pager - [phone]  After 6pm go to [email or web address] - password [email or web address] [facility]  Office  [phone]  CC: Primary care physician; Juluis Pitch, MD  Note: This dictation was prepared with Dragon dictation along with smaller phrase technology. Any transcriptional errors that result from this process are unintentional.

## 2018-04-24 NOTE — Plan of Care (Signed)
Patient becomes very short of breath with minimal exertion. Pulse ox remains in the 90's on 4 liters. Heart rate is stable but remains in afib. Patient needs to be up out of bed while awake and for bowel movements.

## 2018-04-24 NOTE — Progress Notes (Signed)
[facility]  ROUNDING NOTE   Subjective:  Renal function improved. Creatinine down to 1.6. Urine output 950 cc over the preceding 24 hours.  Objective:  Vital signs in last 24 hours:  Temp:  [97.7 F (36.5 C)-98.6 F (37 C)] 98 F (36.7 C) (12/25 0759) Pulse Rate:  [98-122] 113 (12/25 0759) Resp:  [19-20] 19 (12/25 0759) BP: (127-174)/(73-118) 174/117 (12/25 0759) SpO2:  [91 %-96 %] 92 % (12/25 0759) Weight:  [98.3 kg] 98.3 kg (12/25 0453)  Weight change: 2.268 kg Filed Weights   04/21/18 0327 04/23/18 0423 04/24/18 0453  Weight: 91.8 kg 96 kg 98.3 kg    Intake/Output: I/O last 3 completed shifts: In: 3707.4 [P.O.:717; I.V.:2990.4] Out: 1750 [Urine:1750]   Intake/Output this shift:  Total I/O In: 150 [P.O.:150] Out: -   Physical Exam: General: No acute distress  Head: Facial bruising noted  Eyes: Anicteric  Neck: Supple, trachea midline  Lungs:  Clear to auscultation, normal effort  Heart: S1S2 no rubs  Abdomen:  Soft, nontender, bowel sounds present  Extremities: trace peripheral edema.  Neurologic: Awake, alert, following commands  Skin: Scattered ecchymoses noted       Basic Metabolic Panel: Recent Labs  Lab 04/18/18 1128 04/19/18 0527 04/20/18 0347 04/20/18 0558 04/21/18 0331 04/22/18 0325 04/23/18 0302 04/24/18 0601  NA 142 137 137  --  139 139 140 138  K 3.2* 3.6 4.0  --  4.5 4.1 4.1 4.2  CL 106 109 111  --  111 109 107 107  CO2 20* 21* 20*  --  21* 20* 25 24  GLUCOSE 129* 135* 141*  --  125* 126* 128* 127*  BUN 38* 39* 35*  --  39* 39* 39* 30*  CREATININE 1.21* 1.23* 1.12*  --  2.07* 2.23* 2.29* 1.56*  CALCIUM 9.6 8.5* 8.4*  --  8.4* 8.3* 8.2* 8.1*  MG 2.0 1.8 2.4  --   --   --   --  2.0  PHOS  --  2.4*  --  2.2* 4.8* 5.2*  --  3.1    Liver Function Tests: Recent Labs  Lab 04/18/18 1128  AST 119*  ALT 77*  ALKPHOS 75  BILITOT 1.4*  PROT 7.2  ALBUMIN 4.0   No results for input(s): LIPASE, AMYLASE in the last 168  hours. No results for input(s): AMMONIA in the last 168 hours.  CBC: Recent Labs  Lab 04/18/18 1128 04/18/18 1830 04/19/18 0527 04/20/18 0347  WBC 11.4*  --  10.3 9.5  NEUTROABS 9.1*  --   --   --   HGB 15.1* 13.5 12.4 12.2  HCT 47.1*  --  38.7 38.9  MCV 88.9  --  88.4 90.5  PLT 280  --  220 219    Cardiac Enzymes: Recent Labs  Lab 04/18/18 1128 04/18/18 1830 04/18/18 2327 04/19/18 0527 04/20/18 0558  CKTOTAL 1,767*  --   --  474* 307*  TROPONINI 0.20* 0.25* 0.24* 0.16*  --     BNP: Invalid input(s): POCBNP  CBG: Recent Labs  Lab 04/19/18 0859  GLUCAP 131*    Microbiology: Results for orders placed or performed during the hospital encounter of 04/18/18  MRSA PCR Screening     Status: None   Collection Time: 04/19/18  9:09 AM  Result Value Ref Range Status   MRSA by PCR NEGATIVE NEGATIVE Final    Comment:        The GeneXpert MRSA Assay (FDA approved for NASAL specimens only), is  one component of a comprehensive MRSA colonization surveillance program. It is not intended to diagnose MRSA infection nor to guide or monitor treatment for MRSA infections. Performed at [facility], [address] [place], [place] [address]     Coagulation Studies: No results for input(s): LABPROT, INR in the last 72 hours.  Urinalysis: No results for input(s): COLORURINE, LABSPEC, PHURINE, GLUCOSEU, HGBUR, BILIRUBINUR, KETONESUR, PROTEINUR, UROBILINOGEN, NITRITE, LEUKOCYTESUR in the last 72 hours.  Invalid input(s): APPERANCEUR    Imaging: No results found.   Medications:   . sodium chloride 100 mL/hr at 04/24/18 1249   . amiodarone  400 mg Oral BID  . apixaban  2.5 mg Oral BID  . aspirin EC  81 mg Oral Daily  . cholecalciferol  1,000 Units Oral Daily  . [START ON 04/25/2018] diltiazem  360 mg Oral Daily  . docusate sodium  100 mg Oral BID  . feeding supplement (ENSURE ENLIVE)  237 mL Oral BID BM  . ferrous sulfate  325 mg Oral Daily  .  hydrALAZINE  50 mg Oral Q8H  . lidocaine  1 patch Transdermal Q24H  . mouth rinse  15 mL Mouth Rinse BID  . metoprolol  100 mg Oral BID  . multivitamin with minerals  1 tablet Oral Daily  . pravastatin  40 mg Oral QODAY  . sodium chloride flush  10 mL Intravenous Q12H  . traZODone  100 mg Oral QHS  . cyanocobalamin  1,000 mcg Oral Daily   acetaminophen **OR** acetaminophen, cyclobenzaprine, hydrALAZINE, ondansetron **OR** ondansetron (ZOFRAN) IV, oxyCODONE-acetaminophen, polyethylene glycol, simethicone  Assessment/ Plan:  84 y.o. female with hypertension, hyperlipidemia, GERD, breast cancer status post right mastectomy, cataracts, asthma, hemorrhoids, diabetes mellitus type 2, anemia of CKD.   1.  Acute renal failure/CKD stage IV baseline Cr 1.76. 2.  Rhabdomyolysis s/p prolonged fall. 3.  Hypertension.  Plan: Patient doing well.  Creatinine actually now down below her baseline at 1.56.  Overall doing much better as compared to admission.  We will go ahead and discontinue IV fluids.  Continue to monitor renal parameters daily.  LOS: 6 Destiny Oliver 12/25/20192:01 PM

## 2018-04-24 NOTE — Progress Notes (Signed)
Pharmacy Electrolyte Monitoring Consult:  Pharmacy consulted to assist in monitoring and replacing electrolytes in this 84 y.o. female admitted on 04/18/2018 with Fall   Labs:  Sodium (mmol/L)  Date Value  04/24/2018 138  06/02/2012 143   Potassium (mmol/L)  Date Value  04/24/2018 4.2  06/02/2012 3.4 (L)   Magnesium (mg/dL)  Date Value  04/24/2018 2.0   Phosphorus (mg/dL)  Date Value  04/24/2018 3.1   Calcium (mg/dL)  Date Value  04/24/2018 8.1 (L)   Calcium, Total (mg/dL)  Date Value  06/02/2012 9.2   Albumin (g/dL)  Date Value  04/18/2018 4.0  06/02/2012 3.9    Assessment/Plan: K 4.2, Scr 1.56  No supplementation at this time. Baseline Scr ~1.2. Goal potassium ~4 and goal magnesium ~2 met   Pharmacy will sign off at this time  Lu Duffel, PharmD, BCPS Clinical Pharmacist 04/24/2018 8:07 AM

## 2018-04-24 NOTE — Plan of Care (Signed)
Patient had audible wheezing but lungs are clear. Heart rate is stable but is in afib.

## 2018-04-25 ENCOUNTER — Inpatient Hospital Stay: Payer: Medicare Other

## 2018-04-25 LAB — BASIC METABOLIC PANEL
Anion gap: 9 (ref 5–15)
BUN: 27 mg/dL — ABNORMAL HIGH (ref 8–23)
CO2: 25 mmol/L (ref 22–32)
CREATININE: 1.36 mg/dL — AB (ref 0.44–1.00)
Calcium: 8.4 mg/dL — ABNORMAL LOW (ref 8.9–10.3)
Chloride: 103 mmol/L (ref 98–111)
GFR calc non Af Amer: 34 mL/min — ABNORMAL LOW (ref >60)
GFR, EST AFRICAN AMERICAN: 40 mL/min — AB (ref >60)
Glucose, Bld: 123 mg/dL — ABNORMAL HIGH (ref 70–99)
Potassium: 3.9 mmol/L (ref 3.5–5.1)
Sodium: 137 mmol/L (ref 135–145)

## 2018-04-25 LAB — BRAIN NATRIURETIC PEPTIDE: B Natriuretic Peptide: 1222 pg/mL — ABNORMAL HIGH (ref 0.0–100.0)

## 2018-04-25 MED ORDER — FUROSEMIDE 10 MG/ML IJ SOLN
20.0000 mg | Freq: Two times a day (BID) | INTRAMUSCULAR | Status: DC
  Administered 2018-04-25: 20 mg via INTRAVENOUS
  Filled 2018-04-25: qty 2

## 2018-04-25 NOTE — Progress Notes (Signed)
[place] [facility] [place] NAME: Destiny Oliver    MR#:  [id number]  DATE OF BIRTH:  [birth year]  SUBJECTIVE:  Patient with complaints of shortness of breath overnight, IV fluids were discontinued, chest x-ray and BNP consistent with heart failure, start Lasix IV REVIEW OF SYSTEMS:  CONSTITUTIONAL: No fever, fatigue or weakness.  EYES: No blurred or double vision.  EARS, NOSE, AND THROAT: No tinnitus or ear pain.  RESPIRATORY: No cough, shortness of breath, wheezing or hemoptysis.  CARDIOVASCULAR: No chest pain, orthopnea, edema.  GASTROINTESTINAL: No nausea, vomiting, diarrhea or abdominal pain.  GENITOURINARY: No dysuria, hematuria.  ENDOCRINE: No polyuria, nocturia,  HEMATOLOGY: No anemia, easy bruising or bleeding SKIN: No rash or lesion. MUSCULOSKELETAL: No joint pain or arthritis.   NEUROLOGIC: No tingling, numbness, weakness.  PSYCHIATRY: No anxiety or depression.   ROS  DRUG ALLERGIES:   Allergies  Allergen Reactions  . Penicillins Swelling    VITALS:  Blood pressure 112/72, pulse (!) 110, temperature 97.9 F (36.6 C), temperature source Oral, resp. rate 20, height 5\' 1"  (1.549 m), weight 99.4 kg, SpO2 95 %.  PHYSICAL EXAMINATION:  GENERAL:  84 y.o.-year-old patient lying in the bed with no acute distress.  EYES: Pupils equal, round, reactive to light and accommodation. No scleral icterus. Extraocular muscles intact.  HEENT: Head atraumatic, normocephalic. Oropharynx and nasopharynx clear.  NECK:  Supple, no jugular venous distention. No thyroid enlargement, no tenderness.  LUNGS: Normal breath sounds bilaterally, no wheezing, rales,rhonchi or crepitation. No use of accessory muscles of respiration.  CARDIOVASCULAR: S1, S2 normal. No murmurs, rubs, or gallops.  ABDOMEN: Soft, nontender, nondistended. Bowel sounds present. No organomegaly or mass.  EXTREMITIES: No pedal edema, cyanosis, or clubbing.  NEUROLOGIC: Cranial nerves II  through XII are intact. Muscle strength 5/5 in all extremities. Sensation intact. Gait not checked.  PSYCHIATRIC: The patient is alert and oriented x 3.  SKIN: No obvious rash, lesion, or ulcer.   Physical Exam LABORATORY PANEL:   CBC Recent Labs  Lab 04/20/18 0347  WBC 9.5  HGB 12.2  HCT 38.9  PLT 219   ------------------------------------------------------------------------------------------------------------------  Chemistries  Recent Labs  Lab 04/18/18 1128  04/24/18 0601 04/25/18 0542  NA 142   < > 138 137  K 3.2*   < > 4.2 3.9  CL 106   < > 107 103  CO2 20*   < > 24 25  GLUCOSE 129*   < > 127* 123*  BUN 38*   < > 30* 27*  CREATININE 1.21*   < > 1.56* 1.36*  CALCIUM 9.6   < > 8.1* 8.4*  MG 2.0   < > 2.0  --   AST 119*  --   --   --   ALT 77*  --   --   --   ALKPHOS 75  --   --   --   BILITOT 1.4*  --   --   --    < > = values in this interval not displayed.   ------------------------------------------------------------------------------------------------------------------  Cardiac Enzymes Recent Labs  Lab 04/18/18 2327 04/19/18 0527  TROPONINI 0.24* 0.16*   ------------------------------------------------------------------------------------------------------------------  RADIOLOGY:  Dg Chest 2 View  Result Date: 04/25/2018 CLINICAL DATA:  Shortness of breath EXAM: CHEST - 2 VIEW COMPARISON:  July 12, 2014 FINDINGS: Cardiomegaly. Bilateral pulmonary opacities, diffuse but left greater than right with interstitial components. Small bilateral effusions seen on the lateral view. No other change. IMPRESSION: Findings are  most consistent with cardiomegaly, small effusions, and pulmonary edema. An infectious process is considered less likely. Electronically Signed   By: Dorise Bullion III M.D   On: 04/25/2018 08:36    ASSESSMENT AND PLAN:  84 year old female with a history of breast cancer status post right breast mastectomy and hypertension who presented to  the hospital after a fall.  *Acute rhabdomyolysis due to fall Resolved with IV fluids for rehydration  *Acute kidney injury Improved Likely secondary to above as well as dehydration Discontinued lisinopril/hydrochlorothiazide, treated with IV fluids for rehydration, continue to avoid nephrotoxic agents, BMP in the morning, nephrology input appreciated  Renal ultrasound was unimpressive  *Acute diastolic congestive heart failure exacerbation Echocardiogram with normal ejection fraction Congestive heart failure protocol, on Eliquis, continue aspirin, start IV Lasix twice daily, beta-blocker therapy, strict I&O monitoring, daily weights, fluid restriction, low-sodium diet   *Acute elevated troponin Secondary to demand ischemia Patient has ruled out for ACS  *Atrial fibrillation with RVR Dr. Kowalski/cardiology input appreciated, continue amiodarone, Eliquis, increase diltiazem to 360 mg daily, and beta-blocker therapy  *Essential hypertension Stable Lisinopril/hydrochlorothiazide discontinued due to acute kidney injury/dehydration, continue metoprolol, increase diltiazem, hydralazine as needed systolic blood pressure greater than 160  *Acute insomnia Trazodone at bedtime  Disposition to skilled nursing facility when bed when CHF improved    All the records are reviewed and case discussed with Care Management/Social Workerr. Management plans discussed with the patient, family and they are in agreement.  CODE STATUS: dnr  TOTAL TIME TAKING CARE OF THIS PATIENT: 30 minutes.     POSSIBLE D/C IN 1-2 DAYS, DEPENDING ON CLINICAL CONDITION.   [name] Salary M.D on 04/25/2018   Between 7am to 6pm - Pager - [phone]  After 6pm go to [email or web address] - password [email or web address] [facility]  Office  [phone]  CC: Primary care physician; Juluis Pitch, MD  Note: This dictation was prepared with Dragon dictation along with smaller phrase technology. Any  transcriptional errors that result from this process are unintentional.

## 2018-04-25 NOTE — Care Management Important Message (Signed)
Important Message  Patient Details  Name: Destiny Oliver MRN: [id number] Date of Birth: April 26, 1927   Medicare Important Message Given:  Yes    Destiny Oliver 04/25/2018, 2:04 PM

## 2018-04-25 NOTE — Progress Notes (Signed)
Pharmacy Electrolyte Monitoring Consult:  Pharmacy consulted to assist in monitoring and replacing electrolytes in this 85 y.o. female admitted on 04/18/2018 with Fall   Labs:  Sodium (mmol/L)  Date Value  04/25/2018 137  06/02/2012 143   Potassium (mmol/L)  Date Value  04/25/2018 3.9  06/02/2012 3.4 (L)   Magnesium (mg/dL)  Date Value  04/24/2018 2.0   Phosphorus (mg/dL)  Date Value  04/24/2018 3.1   Calcium (mg/dL)  Date Value  04/25/2018 8.4 (L)   Calcium, Total (mg/dL)  Date Value  06/02/2012 9.2   Albumin (g/dL)  Date Value  04/18/2018 4.0  06/02/2012 3.9    Assessment/Plan: Goal potassium ~4.0 and goal magnesium ~2.0 K 3.9, Scr 1.36  No supplementation at this time. Baseline Scr ~1.2. Furosemide 20 mg IV q12h started today; patient's weight up ~19lbs since admission.   Pharmacy will continue to monitor.   Paticia Stack, PharmD Pharmacy Resident  04/25/2018 10:26 AM

## 2018-04-25 NOTE — Care Management (Signed)
Barrier to discharge-SOB overnight; chest x-ray and BNP > 1200 consistent with CHF; starting IV lasix BID

## 2018-04-25 NOTE — Progress Notes (Signed)
[facility]  ROUNDING NOTE   Subjective:  Patient short of breath today. Chest x-ray performed and shows pulmonary edema.  Objective:  Vital signs in last 24 hours:  Temp:  [97.9 F (36.6 C)-98.1 F (36.7 C)] 97.9 F (36.6 C) (12/26 0759) Pulse Rate:  [108-112] 110 (12/26 0759) Resp:  [19-20] 20 (12/26 0759) BP: (112-150)/(72-102) 112/72 (12/26 0759) SpO2:  [91 %-95 %] 95 % (12/26 0759) Weight:  [99.4 kg] 99.4 kg (12/26 0453)  Weight change: 1.089 kg Filed Weights   04/23/18 0423 04/24/18 0453 04/25/18 0453  Weight: 96 kg 98.3 kg 99.4 kg    Intake/Output: I/O last 3 completed shifts: In: 2029.5 [P.O.:270; I.V.:1759.5] Out: 2100 [Urine:2100]   Intake/Output this shift:  Total I/O In: 480 [P.O.:480] Out: 600 [Urine:600]  Physical Exam: General: No acute distress  Head: Facial bruising noted  Eyes: Anicteric  Neck: Supple, trachea midline  Lungs:  Basilar rales, normal effort  Heart: S1S2 no rubs  Abdomen:  Soft, nontender, bowel sounds present  Extremities: trace peripheral edema.  Neurologic: Awake, alert, following commands  Skin: Scattered ecchymoses noted       Basic Metabolic Panel: Recent Labs  Lab 04/19/18 0527 04/20/18 0347 04/20/18 0558 04/21/18 0331 04/22/18 0325 04/23/18 0302 04/24/18 0601 04/25/18 0542  NA 137 137  --  139 139 140 138 137  K 3.6 4.0  --  4.5 4.1 4.1 4.2 3.9  CL 109 111  --  111 109 107 107 103  CO2 21* 20*  --  21* 20* 25 24 25   GLUCOSE 135* 141*  --  125* 126* 128* 127* 123*  BUN 39* 35*  --  39* 39* 39* 30* 27*  CREATININE 1.23* 1.12*  --  2.07* 2.23* 2.29* 1.56* 1.36*  CALCIUM 8.5* 8.4*  --  8.4* 8.3* 8.2* 8.1* 8.4*  MG 1.8 2.4  --   --   --   --  2.0  --   PHOS 2.4*  --  2.2* 4.8* 5.2*  --  3.1  --     Liver Function Tests: No results for input(s): AST, ALT, ALKPHOS, BILITOT, PROT, ALBUMIN in the last 168 hours. No results for input(s): LIPASE, AMYLASE in the last 168 hours. No results for  input(s): AMMONIA in the last 168 hours.  CBC: Recent Labs  Lab 04/18/18 1830 04/19/18 0527 04/20/18 0347  WBC  --  10.3 9.5  HGB 13.5 12.4 12.2  HCT  --  38.7 38.9  MCV  --  88.4 90.5  PLT  --  220 219    Cardiac Enzymes: Recent Labs  Lab 04/18/18 1830 04/18/18 2327 04/19/18 0527 04/20/18 0558  CKTOTAL  --   --  474* 307*  TROPONINI 0.25* 0.24* 0.16*  --     BNP: Invalid input(s): POCBNP  CBG: Recent Labs  Lab 04/19/18 0859  GLUCAP 131*    Microbiology: Results for orders placed or performed during the hospital encounter of 04/18/18  MRSA PCR Screening     Status: None   Collection Time: 04/19/18  9:09 AM  Result Value Ref Range Status   MRSA by PCR NEGATIVE NEGATIVE Final    Comment:        The GeneXpert MRSA Assay (FDA approved for NASAL specimens only), is one component of a comprehensive MRSA colonization surveillance program. It is not intended to diagnose MRSA infection nor to guide or monitor treatment for MRSA infections. Performed at [facility], [place], [place]  [address]     Coagulation Studies: No results for input(s): LABPROT, INR in the last 72 hours.  Urinalysis: No results for input(s): COLORURINE, LABSPEC, PHURINE, GLUCOSEU, HGBUR, BILIRUBINUR, KETONESUR, PROTEINUR, UROBILINOGEN, NITRITE, LEUKOCYTESUR in the last 72 hours.  Invalid input(s): APPERANCEUR    Imaging: Dg Chest 2 View  Result Date: 04/25/2018 CLINICAL DATA:  Shortness of breath EXAM: CHEST - 2 VIEW COMPARISON:  July 12, 2014 FINDINGS: Cardiomegaly. Bilateral pulmonary opacities, diffuse but left greater than right with interstitial components. Small bilateral effusions seen on the lateral view. No other change. IMPRESSION: Findings are most consistent with cardiomegaly, small effusions, and pulmonary edema. An infectious process is considered less likely. Electronically Signed   By: Dorise Bullion III M.D   On: 04/25/2018 08:36      Medications:    . amiodarone  400 mg Oral BID  . apixaban  2.5 mg Oral BID  . aspirin EC  81 mg Oral Daily  . cholecalciferol  1,000 Units Oral Daily  . diltiazem  360 mg Oral Daily  . docusate sodium  100 mg Oral BID  . feeding supplement (ENSURE ENLIVE)  237 mL Oral BID BM  . ferrous sulfate  325 mg Oral Daily  . furosemide  20 mg Intravenous Q12H  . hydrALAZINE  50 mg Oral Q8H  . lidocaine  1 patch Transdermal Q24H  . mouth rinse  15 mL Mouth Rinse BID  . metoprolol  100 mg Oral BID  . multivitamin with minerals  1 tablet Oral Daily  . pravastatin  40 mg Oral QODAY  . sodium chloride flush  10 mL Intravenous Q12H  . traZODone  100 mg Oral QHS  . cyanocobalamin  1,000 mcg Oral Daily   acetaminophen **OR** acetaminophen, cyclobenzaprine, hydrALAZINE, ondansetron **OR** ondansetron (ZOFRAN) IV, oxyCODONE-acetaminophen, polyethylene glycol, simethicone  Assessment/ Plan:  84 y.o. female with hypertension, hyperlipidemia, GERD, breast cancer status post right mastectomy, cataracts, asthma, hemorrhoids, diabetes mellitus type 2, anemia of CKD.   1.  Acute renal failure/CKD stage IV baseline Cr 1.76. 2.  Rhabdomyolysis s/p prolonged fall. 3.  Hypertension.  Plan: pulmonary edema noted on chest x-ray.  Patient off of IV fluids.  Agree with adding Lasix 20 mg IV every 12 hours for now.  Renal function appears to have stabilized in fact better than her prior baseline.  LOS: 7 Tirrell Buchberger 12/26/20191:44 PM

## 2018-04-25 NOTE — Progress Notes (Signed)
Pt's BP is 105/66 HR 103, pt due to get 20mg  IV lasix, 100mg  PO metoprolol, 50mg  hydralazine, Spoke to Dr. Jannifer Franklin hold these medications due to low BP. Conley Simmonds, RN, BSN

## 2018-04-25 NOTE — Progress Notes (Signed)
Physical Therapy Treatment Patient Details Name: Destiny Oliver MRN: [id number] DOB: [birth year] Today's Date: 04/25/2018    History of Present Illness 84 yo female with onset of a-fib and fall at home, with rhabdomyolysis, was admitted.  Cleared for facial fractures, very bruised.  Had elevated troponin but was deemed demand ischemia.  PMHx:  R breast CA, HTN, tachycardia, recent SOB (PM of 04/24/2018), work up showed acute diastolic CHF exacerbation.    PT Comments    Patient alert, agreeable to PT, mild complaints of SOB while at rest, O2 sats > 90% on 3L [place] (on O2 throughout session). Pt able to participate in bed level exercises with tactile cues, 1-2 rest breaks with cues for breathing technique. Pt mobilized to EOB with modAx1, pt anxious and grew more SOB. Sitting EOB with feet support and bilateral UE supported pt able to maintain midline with supervision. SpO2 at 88%, able to recover to >90% ~6mins with cueing. Sit <> stand and ambulation to chair with RW and mina-CGAx2 for safety. Pt very fatigued at end of session, all needs in reach, nursing in and out of room for handoff of progress. The patient would benefit from continued skilled PT to progress towards goals to maximize mobility, independence, and safety.     Follow Up Recommendations  SNF     Equipment Recommendations  Rolling walker with 5" wheels    Recommendations for Other Services       Precautions / Restrictions Precautions Precautions: Fall Restrictions Weight Bearing Restrictions: No    Mobility  Bed Mobility Overal bed mobility: Needs Assistance Bed Mobility: Supine to Sit     Supine to sit: Mod assist     General bed mobility comments: Pt anxious, unsure during transfer, max verbal encouragement  Transfers Overall transfer level: Needs assistance Equipment used: Rolling walker (2 wheeled) Transfers: Sit to/from Stand Sit to Stand: Min assist;+2 safety/equipment;From elevated surface          General transfer comment: fatigued quickly, SOB  Ambulation/Gait Ambulation/Gait assistance: Min guard;+2 safety/equipment Gait Distance (Feet): 3 Feet Assistive device: Rolling walker (2 wheeled) Gait Pattern/deviations: Shuffle;Wide base of support         Stairs             Wheelchair Mobility    Modified Rankin (Stroke Patients Only)       Balance Overall balance assessment: Needs assistance Sitting-balance support: Feet supported Sitting balance-Leahy Scale: Fair Sitting balance - Comments: Pt able to progress to sitting with supervision. O2 sats initially 88%, able to improve ~83mins with cues for breathing technique to >90% on 3L O2.   Standing balance support: Bilateral upper extremity supported;During functional activity Standing balance-Leahy Scale: Poor Standing balance comment: Pt anxious and complained of fatigue, able to transfer to chair with CGAx1                            Cognition Arousal/Alertness: Awake/alert Behavior During Therapy: WFL for tasks assessed/performed Overall Cognitive Status: Within Functional Limits for tasks assessed                                        Exercises General Exercises - Lower Extremity Ankle Circles/Pumps: AROM;Both;20 reps Gluteal Sets: AROM;Both;10 reps Heel Slides: AROM;Both;10 reps Hip ABduction/ADduction: AROM;Both;10 reps Straight Leg Raises: AROM;Both;10 reps    General Comments  Pertinent Vitals/Pain Pain Assessment: No/denies pain    Home Living                      Prior Function            PT Goals (current goals can now be found in the care plan section) Progress towards PT goals: Progressing toward goals    Frequency    Min 2X/week      PT Plan Current plan remains appropriate    Co-evaluation              AM-PAC PT "6 Clicks" Mobility   Outcome Measure  Help needed turning from your back to your side while in a flat  bed without using bedrails?: A Lot Help needed moving from lying on your back to sitting on the side of a flat bed without using bedrails?: A Lot Help needed moving to and from a bed to a chair (including a wheelchair)?: A Lot Help needed standing up from a chair using your arms (e.g., wheelchair or bedside chair)?: A Lot Help needed to walk in hospital room?: A Lot Help needed climbing 3-5 steps with a railing? : Total 6 Click Score: 11    End of Session Equipment Utilized During Treatment: Gait belt;Oxygen(3L) Activity Tolerance: Patient limited by fatigue Patient left: in chair;with chair alarm set;with call bell/phone within reach Nurse Communication: Other (comment);Mobility status(oxygen status) PT Visit Diagnosis: Unsteadiness on feet (R26.81);Muscle weakness (generalized) (M62.81);Difficulty in walking, not elsewhere classified (R26.2);Adult, failure to thrive (R62.7)     Time: 1829-9371 PT Time Calculation (min) (ACUTE ONLY): 25 min  Charges:  $Therapeutic Exercise: 8-22 mins $Therapeutic Activity: 8-22 mins                    Lieutenant Diego PT, DPT [phone] PM,04/25/18 [id number]

## 2018-04-26 ENCOUNTER — Inpatient Hospital Stay: Payer: Medicare Other

## 2018-04-26 ENCOUNTER — Encounter
Admission: RE | Admit: 2018-04-26 | Discharge: 2018-04-26 | Disposition: A | Discharge status: Home / Self Care | Payer: Medicare Other | Source: Ambulatory Visit | Attending: Internal Medicine | Admitting: Internal Medicine

## 2018-04-26 ENCOUNTER — Encounter: Payer: Self-pay | Admitting: *Deleted

## 2018-04-26 LAB — BASIC METABOLIC PANEL
Anion gap: 9 (ref 5–15)
BUN: 32 mg/dL — AB (ref 8–23)
CO2: 25 mmol/L (ref 22–32)
CREATININE: 1.43 mg/dL — AB (ref 0.44–1.00)
Calcium: 8.4 mg/dL — ABNORMAL LOW (ref 8.9–10.3)
Chloride: 104 mmol/L (ref 98–111)
GFR calc Af Amer: 38 mL/min — ABNORMAL LOW (ref >60)
GFR calc non Af Amer: 32 mL/min — ABNORMAL LOW (ref >60)
Glucose, Bld: 171 mg/dL — ABNORMAL HIGH (ref 70–99)
Potassium: 3.4 mmol/L — ABNORMAL LOW (ref 3.5–5.1)
Sodium: 138 mmol/L (ref 135–145)

## 2018-04-26 LAB — BLOOD GAS, ARTERIAL
Acid-Base Excess: 2.8 mmol/L — ABNORMAL HIGH (ref 0.0–2.0)
Bicarbonate: 26.1 mmol/L (ref 20.0–28.0)
FIO2: 0.28
O2 Saturation: 93.6 %
PH ART: 7.48 — AB (ref 7.350–7.450)
PO2 ART: 64 mmHg — AB (ref 83.0–108.0)
Patient temperature: 37
pCO2 arterial: 35 mmHg (ref 32.0–48.0)

## 2018-04-26 LAB — MAGNESIUM: Magnesium: 2.1 mg/dL (ref 1.7–2.4)

## 2018-04-26 MED ORDER — BUDESONIDE 0.5 MG/2ML IN SUSP
0.5000 mg | Freq: Two times a day (BID) | RESPIRATORY_TRACT | Status: DC
  Administered 2018-04-26 – 2018-04-30 (×7): 0.5 mg via RESPIRATORY_TRACT
  Filled 2018-04-26 (×8): qty 2

## 2018-04-26 MED ORDER — BUDESONIDE 0.5 MG/2ML IN SUSP: 0.5000 mg | Freq: Two times a day (BID) | RESPIRATORY_TRACT | Status: DC

## 2018-04-26 MED ORDER — POTASSIUM CHLORIDE 20 MEQ PO PACK
40.0000 meq | PACK | Freq: Once | ORAL | Status: AC
  Administered 2018-04-26: 40 meq via ORAL
  Filled 2018-04-26: qty 2

## 2018-04-26 MED ORDER — FUROSEMIDE 10 MG/ML IJ SOLN
20.0000 mg | Freq: Two times a day (BID) | INTRAMUSCULAR | Status: DC
  Filled 2018-04-26: qty 2

## 2018-04-26 MED ORDER — DILTIAZEM HCL ER COATED BEADS 360 MG PO CP24: 360.0000 mg | ORAL_CAPSULE | Freq: Every day | ORAL | 1 refills | Status: DC

## 2018-04-26 MED ORDER — DILTIAZEM HCL ER COATED BEADS 120 MG PO CP24
240.0000 mg | ORAL_CAPSULE | Freq: Every day | ORAL | Status: DC
  Administered 2018-04-27 – 2018-04-30 (×4): 240 mg via ORAL
  Filled 2018-04-26 (×4): qty 2

## 2018-04-26 MED ORDER — HYDRALAZINE HCL 50 MG PO TABS: 50.0000 mg | ORAL_TABLET | Freq: Three times a day (TID) | ORAL | 0 refills | Status: DC

## 2018-04-26 MED ORDER — IPRATROPIUM-ALBUTEROL 0.5-2.5 (3) MG/3ML IN SOLN
3.0000 mL | Freq: Three times a day (TID) | RESPIRATORY_TRACT | Status: DC
  Administered 2018-04-26 – 2018-04-27 (×2): 3 mL via RESPIRATORY_TRACT
  Filled 2018-04-26 (×2): qty 3

## 2018-04-26 MED ORDER — TRAZODONE HCL 100 MG PO TABS: 100.0000 mg | ORAL_TABLET | Freq: Every evening | ORAL | 0 refills | Status: DC | PRN

## 2018-04-26 MED ORDER — IPRATROPIUM-ALBUTEROL 0.5-2.5 (3) MG/3ML IN SOLN
3.0000 mL | RESPIRATORY_TRACT | Status: DC | PRN
  Administered 2018-04-26: 3 mL via RESPIRATORY_TRACT
  Filled 2018-04-26: qty 3

## 2018-04-26 MED ORDER — ENSURE ENLIVE PO LIQD: 237.0000 mL | Freq: Two times a day (BID) | ORAL | 1 refills | Status: DC

## 2018-04-26 MED ORDER — POTASSIUM CHLORIDE ER 10 MEQ PO TBCR: 10.0000 meq | EXTENDED_RELEASE_TABLET | Freq: Every morning | ORAL | 0 refills | Status: DC

## 2018-04-26 MED ORDER — HYDROCHLOROTHIAZIDE 12.5 MG PO TABS: 12.5000 mg | ORAL_TABLET | Freq: Every day | ORAL | 0 refills | Status: DC

## 2018-04-26 MED ORDER — IPRATROPIUM-ALBUTEROL 0.5-2.5 (3) MG/3ML IN SOLN: 3.0000 mL | RESPIRATORY_TRACT | 0 refills | Status: DC | PRN

## 2018-04-26 MED ORDER — IPRATROPIUM-ALBUTEROL 0.5-2.5 (3) MG/3ML IN SOLN: 3.0000 mL | Freq: Four times a day (QID) | RESPIRATORY_TRACT | Status: DC

## 2018-04-26 MED ORDER — APIXABAN 2.5 MG PO TABS: 2.5000 mg | ORAL_TABLET | Freq: Two times a day (BID) | ORAL | 0 refills | Status: DC

## 2018-04-26 MED ORDER — FUROSEMIDE 20 MG PO TABS
20.0000 mg | ORAL_TABLET | Freq: Every day | ORAL | Status: DC
  Administered 2018-04-27 – 2018-04-28 (×2): 20 mg via ORAL
  Filled 2018-04-26 (×2): qty 1

## 2018-04-26 MED ORDER — AMIODARONE HCL 400 MG PO TABS: 400.0000 mg | ORAL_TABLET | Freq: Two times a day (BID) | ORAL | 0 refills | Status: DC

## 2018-04-26 MED ORDER — FUROSEMIDE 20 MG PO TABS: 20.0000 mg | ORAL_TABLET | Freq: Every morning | ORAL | 0 refills | Status: DC

## 2018-04-26 MED ORDER — POTASSIUM CHLORIDE CRYS ER 20 MEQ PO TBCR: 20.0000 meq | EXTENDED_RELEASE_TABLET | Freq: Once | ORAL | Status: DC

## 2018-04-26 NOTE — Progress Notes (Signed)
Pharmacy Electrolyte Monitoring Consult:  Pharmacy consulted to assist in monitoring and replacing electrolytes in this 84 y.o. female admitted on 04/18/2018 with Fall   Labs:  Sodium (mmol/L)  Date Value  04/26/2018 138  06/02/2012 143   Potassium (mmol/L)  Date Value  04/26/2018 3.4 (L)  06/02/2012 3.4 (L)   Magnesium (mg/dL)  Date Value  04/26/2018 2.1   Phosphorus (mg/dL)  Date Value  04/24/2018 3.1   Calcium (mg/dL)  Date Value  04/26/2018 8.4 (L)   Calcium, Total (mg/dL)  Date Value  06/02/2012 9.2   Albumin (g/dL)  Date Value  04/18/2018 4.0  06/02/2012 3.9    Assessment/Plan: Goal potassium ~4.0 and goal magnesium ~2.0 K 3.4, Mag 2.1 Scr 1.43 Furosemide 20 mg IV q12h started 12/26 now stopped - only received one dose   MD has ordered KCl 40 mEq PO x1 for today.  Will continue with MD orders and order BMET for AM  Pharmacy will continue to monitor.   Rayna Sexton, PharmD, BCPS Clinical Pharmacist 04/26/2018 9:11 AM

## 2018-04-26 NOTE — Progress Notes (Signed)
Patient short of breath with expiratory wheezes, MD notified, orders given for PRN duonebz, CXR, and ABG. Will continue to assess and monitor.

## 2018-04-26 NOTE — Progress Notes (Signed)
[facility]  ROUNDING NOTE   Subjective:  Cr remains below baseline at 1.4. UOP 700cc over the preceding 24 hours.   Objective:  Vital signs in last 24 hours:  Temp:  [97.5 F (36.4 C)-98.2 F (36.8 C)] 98.2 F (36.8 C) (12/27 0932) Pulse Rate:  [103-108] 108 (12/27 0932) Resp:  [22] 22 (12/27 0932) BP: (103-125)/(63-68) 125/68 (12/27 0932) SpO2:  [88 %-96 %] 93 % (12/27 1358) Weight:  [99.5 kg] 99.5 kg (12/27 0544)  Weight change: 0.091 kg Filed Weights   04/24/18 0453 04/25/18 0453 04/26/18 0544  Weight: 98.3 kg 99.4 kg 99.5 kg    Intake/Output: I/O last 3 completed shifts: In: 483 [P.O.:480; I.V.:3] Out: 1350 [Urine:1350]   Intake/Output this shift:  No intake/output data recorded.  Physical Exam: General: No acute distress  Head: Facial bruising noted  Eyes: Anicteric  Neck: Supple, trachea midline  Lungs:  Basilar rales, normal effort  Heart: S1S2 no rubs  Abdomen:  Soft, nontender, bowel sounds present  Extremities: trace peripheral edema.  Neurologic: Awake, alert, following commands  Skin: Scattered ecchymoses noted       Basic Metabolic Panel: Recent Labs  Lab 04/20/18 0347 04/20/18 0558 04/21/18 0331 04/22/18 0325 04/23/18 0302 04/24/18 0601 04/25/18 0542 04/26/18 0538  NA 137  --  139 139 140 138 137 138  K 4.0  --  4.5 4.1 4.1 4.2 3.9 3.4*  CL 111  --  111 109 107 107 103 104  CO2 20*  --  21* 20* 25 24 25 25   GLUCOSE 141*  --  125* 126* 128* 127* 123* 171*  BUN 35*  --  39* 39* 39* 30* 27* 32*  CREATININE 1.12*  --  2.07* 2.23* 2.29* 1.56* 1.36* 1.43*  CALCIUM 8.4*  --  8.4* 8.3* 8.2* 8.1* 8.4* 8.4*  MG 2.4  --   --   --   --  2.0  --  2.1  PHOS  --  2.2* 4.8* 5.2*  --  3.1  --   --     Liver Function Tests: No results for input(s): AST, ALT, ALKPHOS, BILITOT, PROT, ALBUMIN in the last 168 hours. No results for input(s): LIPASE, AMYLASE in the last 168 hours. No results for input(s): AMMONIA in the last 168  hours.  CBC: Recent Labs  Lab 04/20/18 0347  WBC 9.5  HGB 12.2  HCT 38.9  MCV 90.5  PLT 219    Cardiac Enzymes: Recent Labs  Lab 04/20/18 0558  CKTOTAL 307*    BNP: Invalid input(s): POCBNP  CBG: No results for input(s): GLUCAP in the last 168 hours.  Microbiology: Results for orders placed or performed during the hospital encounter of 04/18/18  MRSA PCR Screening     Status: None   Collection Time: 04/19/18  9:09 AM  Result Value Ref Range Status   MRSA by PCR NEGATIVE NEGATIVE Final    Comment:        The GeneXpert MRSA Assay (FDA approved for NASAL specimens only), is one component of a comprehensive MRSA colonization surveillance program. It is not intended to diagnose MRSA infection nor to guide or monitor treatment for MRSA infections. Performed at [facility], [address] [place], [place] [address]     Coagulation Studies: No results for input(s): LABPROT, INR in the last 72 hours.  Urinalysis: No results for input(s): COLORURINE, LABSPEC, PHURINE, GLUCOSEU, HGBUR, BILIRUBINUR, KETONESUR, PROTEINUR, UROBILINOGEN, NITRITE, LEUKOCYTESUR in the last 72 hours.  Invalid input(s): APPERANCEUR  Imaging: Dg Chest 2 View  Result Date: 04/26/2018 CLINICAL DATA:  Shortness of breath, wheezing, tachycardia EXAM: CHEST - 2 VIEW COMPARISON:  Chest x-ray of 04/25/2017 FINDINGS: There is cardiomegaly present with bilateral effusions most consistent with CHF. However there is opacity in the right upper lobe and peripherally at the left lung base and superimposed multifocal pneumonia can not be excluded. No bony abnormality is seen. Recommend follow-up two-view chest x-ray. IMPRESSION: 1. Cardiomegaly and bilateral effusions may indicate mild CHF. 2. However patchy opacities bilaterally particularly in the right upper lobe are suspicious for superimposed pneumonia and follow-up two-view chest x-ray is recommended. Electronically Signed   By: Ivar Drape M.D.   On: 04/26/2018 11:31   Dg Chest 2 View  Result Date: 04/25/2018 CLINICAL DATA:  Shortness of breath EXAM: CHEST - 2 VIEW COMPARISON:  July 12, 2014 FINDINGS: Cardiomegaly. Bilateral pulmonary opacities, diffuse but left greater than right with interstitial components. Small bilateral effusions seen on the lateral view. No other change. IMPRESSION: Findings are most consistent with cardiomegaly, small effusions, and pulmonary edema. An infectious process is considered less likely. Electronically Signed   By: Dorise Bullion III M.D   On: 04/25/2018 08:36     Medications:    . amiodarone  400 mg Oral BID  . apixaban  2.5 mg Oral BID  . aspirin EC  81 mg Oral Daily  . budesonide (PULMICORT) nebulizer solution  0.5 mg Nebulization BID  . cholecalciferol  1,000 Units Oral Daily  . [START ON 04/27/2018] diltiazem  240 mg Oral Daily  . docusate sodium  100 mg Oral BID  . feeding supplement (ENSURE ENLIVE)  237 mL Oral BID BM  . ferrous sulfate  325 mg Oral Daily  . furosemide  20 mg Intravenous BID  . ipratropium-albuterol  3 mL Nebulization QID  . lidocaine  1 patch Transdermal Q24H  . mouth rinse  15 mL Mouth Rinse BID  . metoprolol  100 mg Oral BID  . multivitamin with minerals  1 tablet Oral Daily  . pravastatin  40 mg Oral QODAY  . sodium chloride flush  10 mL Intravenous Q12H  . traZODone  100 mg Oral QHS  . cyanocobalamin  1,000 mcg Oral Daily   acetaminophen **OR** acetaminophen, cyclobenzaprine, hydrALAZINE, ondansetron **OR** ondansetron (ZOFRAN) IV, oxyCODONE-acetaminophen, polyethylene glycol, simethicone  Assessment/ Plan:  84 y.o. female with hypertension, hyperlipidemia, GERD, breast cancer status post right mastectomy, cataracts, asthma, hemorrhoids, diabetes mellitus type 2, anemia of CKD.   1.  Acute renal failure/CKD stage IV baseline Cr 1.76. 2.  Rhabdomyolysis s/p prolonged fall. 3.  Hypertension.  Plan: renal function remained stable.  Continue  Lasix for now.  Rhabdomyolysis resolved.  Check renal function daily as well. Further plan as patient progresses.  LOS: 8 Destiny Oliver 12/27/20193:22 PM

## 2018-04-26 NOTE — Progress Notes (Signed)
Pt's BP is 103/63 HR 107, pt due to get 50mg  PO hydralazine this morning, MD paged, Dr. Marcille Blanco states to hold this medication due to low BP. Will continue to monitor. Conley Simmonds, RN, BSN

## 2018-04-26 NOTE — Progress Notes (Signed)
Patients BP 106/61, MD notified, orders to hold IV lasix, change dose to PO lasix in morning.

## 2018-04-26 NOTE — Discharge Summary (Addendum)
[place] [facility] [place] NAME: Destiny Oliver    MR#:  [id number]  DATE OF BIRTH:  09/26/1926  DATE OF ADMISSION:  04/18/2018 ADMITTING PHYSICIAN: Gladstone Lighter, MD  DATE OF DISCHARGE: No discharge date for patient encounter.  PRIMARY CARE PHYSICIAN: Juluis Pitch, MD    ADMISSION DIAGNOSIS:  Dehydration [E86.0] Fall [W19.XXXA] Contusion of face, initial encounter [S00.83XA] Fall, initial encounter [W19.XXXA] Atrial fibrillation, unspecified type ([place]) [I48.91] A-fib ([place]) [I48.91]  DISCHARGE DIAGNOSIS:  Active Problems:   A-fib ([place])   SECONDARY DIAGNOSIS:   Past Medical History:  Diagnosis Date  . Breast cancer ([place]) 1994   right breast  . CKD (chronic kidney disease)   . Hypertension   . Tachycardia     HOSPITAL COURSE:   84 year old female with a history of breast cancer status post right breast mastectomy and hypertension who presented to the hospital after a fall.  *Acute rhabdomyolysis due to fall Resolved with IV fluids for rehydration  *Acute kidney injury Improved Likely secondary to above as well as dehydration Discontinued home dose of lisinopril/hydrochlorothiazide, treated with IV fluids for rehydration, avoided nephrotoxic agents, nephrology did see patient while in house   Renal ultrasound was unimpressive  *Acute diastolic congestive heart failure exacerbation Resolved Secondary to numerous days of IV fluids for rehydration given rhabdomyolysis/acute kidney injury Echocardiogram with normal ejection fraction Placed on congestive heart failure protocol, on Eliquis, aspirin, resolved with 24-hour course of low-dose IV Lasix -changed to 20 mg p.o. every a.m., and patient did well    *Acute hypoxic respiratory failure Secondary to above as well as deconditioning 2 L via nasal cannula continuous going forward, from oxygen required/reordered  *Acute elevated troponin Secondary to demand  ischemia Patient has ruled out for ACS  *Atrial fibrillation with RVR Dr. Kowalski/cardiology did see patient while in house-treated with amiodarone, Eliquis, diltiazem 360 mg daily, and beta-blocker therapy  *Essential hypertension Stable on current regiment  *Acute insomnia Trazodone at bedtime  DISCHARGE CONDITIONS:  stable  CONSULTS OBTAINED:  Treatment Team:  Anthonette Legato, MD  DRUG ALLERGIES:   Allergies  Allergen Reactions  . Penicillins Swelling    DISCHARGE MEDICATIONS:   Allergies as of 04/26/2018      Reactions   Penicillins Swelling      Medication List    STOP taking these medications   lisinopril-hydrochlorothiazide 20-12.5 MG tablet Commonly known as:  PRINZIDE,ZESTORETIC   verapamil 240 MG CR tablet Commonly known as:  CALAN-SR     TAKE these medications   amiodarone 400 MG tablet Commonly known as:  PACERONE Take 1 tablet (400 mg total) by mouth 2 (two) times daily.   apixaban 2.5 MG Tabs tablet Commonly known as:  ELIQUIS Take 1 tablet (2.5 mg total) by mouth 2 (two) times daily.   aspirin EC 81 MG tablet Take 81 mg by mouth daily.   cholecalciferol 25 MCG (1000 UT) tablet Commonly known as:  VITAMIN D3 Take 1,000 Units by mouth daily.   [employer] VITAMIN B12 2000 MCG tablet Generic drug:  cyanocobalamin Take 1,000 mcg by mouth daily.   diltiazem 360 MG 24 hr capsule Commonly known as:  CARDIZEM CD Take 1 capsule (360 mg total) by mouth daily. Start taking on:  April 27, 2018   feeding supplement (ENSURE ENLIVE) Liqd Take 237 mLs by mouth 2 (two) times daily between meals.   ferrous sulfate 325 (65 FE) MG tablet Take 325 mg by mouth daily.   furosemide  20 MG tablet Commonly known as:  LASIX Take 1 tablet (20 mg total) by mouth every morning.   HAIR SKIN AND NAILS FORMULA Tabs Take 1 tablet by mouth daily.   hydrALAZINE 50 MG tablet Commonly known as:  APRESOLINE Take 1 tablet (50 mg total) by mouth every 8 (eight)  hours. What changed:    how much to take  when to take this   ipratropium-albuterol 0.5-2.5 (3) MG/3ML Soln Commonly known as:  DUONEB Take 3 mLs by nebulization every 4 (four) hours as needed (SOB, wheezing).   metoprolol 200 MG 24 hr tablet Commonly known as:  TOPROL-XL Take 100 mg by mouth 2 (two) times daily.   potassium chloride 10 MEQ tablet Commonly known as:  K-DUR Take 1 tablet (10 mEq total) by mouth every morning.   pravastatin 40 MG tablet Commonly known as:  PRAVACHOL Take 40 mg by mouth every other day.   traZODone 100 MG tablet Commonly known as:  DESYREL Take 1 tablet (100 mg total) by mouth at bedtime as needed for sleep.            Durable Medical Equipment  (From admission, onward)         Start     Ordered   04/26/18 1345  For home use only DME oxygen  Once    Question Answer Comment  Mode or (Route) Nasal cannula   Liters per Minute 2   Frequency Continuous (stationary and portable oxygen unit needed)   Oxygen conserving device Yes   Oxygen delivery system Gas      04/26/18 1344           DISCHARGE INSTRUCTIONS:      If you experience worsening of your admission symptoms, develop shortness of breath, life threatening emergency, suicidal or homicidal thoughts you must seek medical attention immediately by calling 911 or calling your MD immediately  if symptoms less severe.  You Must read complete instructions/literature along with all the possible adverse reactions/side effects for all the Medicines you take and that have been prescribed to you. Take any new Medicines after you have completely understood and accept all the possible adverse reactions/side effects.   Please note  You were cared for by a hospitalist during your hospital stay. If you have any questions about your discharge medications or the care you received while you were in the hospital after you are discharged, you can call the unit and asked to speak with the  hospitalist on call if the hospitalist that took care of you is not available. Once you are discharged, your primary care physician will handle any further medical issues. Please note that NO REFILLS for any discharge medications will be authorized once you are discharged, as it is imperative that you return to your primary care physician (or establish a relationship with a primary care physician if you do not have one) for your aftercare needs so that they can reassess your need for medications and monitor your lab values.    Today   CHIEF COMPLAINT:   Chief Complaint  Patient presents with  . Fall    HISTORY OF PRESENT ILLNESS:   84 y.o. female with a known history of hypertension, history of breast cancer status post right breast mastectomy, tachycardia presents to hospital secondary to fall. Patient is independent at baseline.  Lives at home by herself.  No history of prior falls.  She said she was at her house getting the Christmas staff ready suddenly turned  around and fell on the floor.  She denies losing any consciousness.  But has been on the floor for almost 4 days until friends came to check on her this morning.  She was able to scoot on her elbows to get to the kitchen and had some fluids nearby to keep herself hydrated.  She complained of significant right hip pain as she fell on the right side.  There is bruising on the face and also around the hips.  X-rays of the hip and also CT of the head are negative for any acute findings.  She is being admitted for weakness and rhabdomyolysis.  VITAL SIGNS:  Blood pressure 125/68, pulse (!) 108, temperature 98.2 F (36.8 C), temperature source Oral, resp. rate (!) 22, height 5\' 1"  (1.549 m), weight 99.5 kg, SpO2 95 %.  I/O:    Intake/Output Summary (Last 24 hours) at 04/26/2018 1346 Last data filed at 04/26/2018 0545 Gross per 24 hour  Intake 3 ml  Output 100 ml  Net -97 ml    PHYSICAL EXAMINATION:  GENERAL:  84 y.o.-year-old  patient lying in the bed with no acute distress.  EYES: Pupils equal, round, reactive to light and accommodation. No scleral icterus. Extraocular muscles intact.  HEENT: Head atraumatic, normocephalic. Oropharynx and nasopharynx clear.  NECK:  Supple, no jugular venous distention. No thyroid enlargement, no tenderness.  LUNGS: Normal breath sounds bilaterally, no wheezing, rales,rhonchi or crepitation. No use of accessory muscles of respiration.  CARDIOVASCULAR: S1, S2 normal. No murmurs, rubs, or gallops.  ABDOMEN: Soft, non-tender, non-distended. Bowel sounds present. No organomegaly or mass.  EXTREMITIES: No pedal edema, cyanosis, or clubbing.  NEUROLOGIC: Cranial nerves II through XII are intact. Muscle strength 5/5 in all extremities. Sensation intact. Gait not checked.  PSYCHIATRIC: The patient is alert and oriented x 3.  SKIN: No obvious rash, lesion, or ulcer.   DATA REVIEW:   CBC Recent Labs  Lab 04/20/18 0347  WBC 9.5  HGB 12.2  HCT 38.9  PLT 219    Chemistries  Recent Labs  Lab 04/26/18 0538  NA 138  K 3.4*  CL 104  CO2 25  GLUCOSE 171*  BUN 32*  CREATININE 1.43*  CALCIUM 8.4*  MG 2.1    Cardiac Enzymes No results for input(s): TROPONINI in the last 168 hours.  Microbiology Results  Results for orders placed or performed during the hospital encounter of 04/18/18  MRSA PCR Screening     Status: None   Collection Time: 04/19/18  9:09 AM  Result Value Ref Range Status   MRSA by PCR NEGATIVE NEGATIVE Final    Comment:        The GeneXpert MRSA Assay (FDA approved for NASAL specimens only), is one component of a comprehensive MRSA colonization surveillance program. It is not intended to diagnose MRSA infection nor to guide or monitor treatment for MRSA infections. Performed at [facility] [address] [place], [place] [address]     RADIOLOGY:  Dg Chest 2 View  Result Date: 04/26/2018 CLINICAL DATA:  Shortness of breath, wheezing,  tachycardia EXAM: CHEST - 2 VIEW COMPARISON:  Chest x-ray of 04/25/2017 FINDINGS: There is cardiomegaly present with bilateral effusions most consistent with CHF. However there is opacity in the right upper lobe and peripherally at the left lung base and superimposed multifocal pneumonia can not be excluded. No bony abnormality is seen. Recommend follow-up two-view chest x-ray. IMPRESSION: 1. Cardiomegaly and bilateral effusions may indicate mild CHF. 2. However patchy opacities  bilaterally particularly in the right upper lobe are suspicious for superimposed pneumonia and follow-up two-view chest x-ray is recommended. Electronically Signed   By: Ivar Drape M.D.   On: 04/26/2018 11:31   Dg Chest 2 View  Result Date: 04/25/2018 CLINICAL DATA:  Shortness of breath EXAM: CHEST - 2 VIEW COMPARISON:  July 12, 2014 FINDINGS: Cardiomegaly. Bilateral pulmonary opacities, diffuse but left greater than right with interstitial components. Small bilateral effusions seen on the lateral view. No other change. IMPRESSION: Findings are most consistent with cardiomegaly, small effusions, and pulmonary edema. An infectious process is considered less likely. Electronically Signed   By: Dorise Bullion III M.D   On: 04/25/2018 08:36    EKG:   Orders placed or performed during the hospital encounter of 04/18/18  . ED EKG  . ED EKG  . EKG 12-Lead  . EKG 12-Lead  . EKG 12-Lead  . EKG 12-Lead      Management plans discussed with the patient, family and they are in agreement.  CODE STATUS:     Code Status Orders  (From admission, onward)         Start     Ordered   04/19/18 1320  Do not attempt resuscitation (DNR)  Continuous    Question Answer Comment  In the event of cardiac or respiratory ARREST Do not call a "code blue"   In the event of cardiac or respiratory ARREST Do not perform Intubation, CPR, defibrillation or ACLS   In the event of cardiac or respiratory ARREST Use medication by any route,  position, wound care, and other measures to relive pain and suffering. May use oxygen, suction and manual treatment of airway obstruction as needed for comfort.      04/19/18 1319        Code Status History    Date Active Date Inactive Code Status Order ID Comments User Context   04/18/2018 1726 04/19/2018 1319 Full Code [id number]  Gladstone Lighter, MD ED    Advance Directive Documentation     Most Recent Value  Type of Advance Directive  Healthcare Power of [place], Living will  Pre-existing out of facility DNR order (yellow form or pink MOST form)  -  "MOST" Form in Place?  -      TOTAL TIME TAKING CARE OF THIS PATIENT: 40 minutes.    [name] Salary M.D on 04/26/2018 at 1:46 PM  Between 7am to 6pm - Pager - [phone]  After 6pm go to [email or web address] - password [email or web address] [place] [facility]  Office  [phone]  CC: Primary care physician; Juluis Pitch, MD   Note: This dictation was prepared with Dragon dictation along with smaller phrase technology. Any transcriptional errors that result from this process are unintentional.

## 2018-04-26 NOTE — Clinical Social Work Note (Addendum)
Patient to be d/c'ed today to [facility] room 203B.  Patient and family agreeable to plans will transport via ems RN to call report [phone].  CSW notified Reginold Agent, patient's [place], [phone] and she is aware that patient will be discharging today.  2:26pm  CSW was informed that discharge has been cancelled, CSW updated patient's HCPOA and [employer]  [facility] can still take patient over the weekend if she is medically ready for discharge and orders have been received.  Evette Cristal, MSW, [place]

## 2018-04-26 NOTE — Progress Notes (Signed)
SATURATION QUALIFICATIONS: (This note is used to comply with regulatory documentation for home oxygen)  Patient Saturations on Room Air at Rest = 86%  Patient Saturations on Room Air while Ambulating = n/a%  Patient Saturations on n/a Liters of oxygen while Ambulating = n/a%  Please briefly explain why patient needs home oxygen: Patient was 88% on 2L, placed on 4L and o2 sats increased to 93%, MD notified, IV lasix ordered. D/C cancelled.

## 2018-04-26 NOTE — Plan of Care (Signed)
  Problem: Elimination: Goal: Will not experience complications related to urinary retention Outcome: Progressing   Problem: Pain Managment: Goal: General experience of comfort will improve Outcome: Progressing Note:  No complaints of pain this shift   Problem: Safety: Goal: Ability to remain free from injury will improve Outcome: Progressing   Problem: Skin Integrity: Goal: Risk for impaired skin integrity will decrease Outcome: Progressing

## 2018-04-27 ENCOUNTER — Inpatient Hospital Stay: Payer: Medicare Other

## 2018-04-27 LAB — PROCALCITONIN

## 2018-04-27 LAB — BASIC METABOLIC PANEL
Anion gap: 9 (ref 5–15)
BUN: 35 mg/dL — ABNORMAL HIGH (ref 8–23)
CO2: 26 mmol/L (ref 22–32)
Calcium: 8.3 mg/dL — ABNORMAL LOW (ref 8.9–10.3)
Chloride: 103 mmol/L (ref 98–111)
Creatinine, Ser: 1.43 mg/dL — ABNORMAL HIGH (ref 0.44–1.00)
GFR calc Af Amer: 38 mL/min — ABNORMAL LOW (ref >60)
GFR calc non Af Amer: 32 mL/min — ABNORMAL LOW (ref >60)
Glucose, Bld: 122 mg/dL — ABNORMAL HIGH (ref 70–99)
Potassium: 3.7 mmol/L (ref 3.5–5.1)
Sodium: 138 mmol/L (ref 135–145)

## 2018-04-27 LAB — CBC
HCT: 32.7 % — ABNORMAL LOW (ref 36.0–46.0)
Hemoglobin: 10.1 g/dL — ABNORMAL LOW (ref 12.0–15.0)
MCH: 28.4 pg (ref 26.0–34.0)
MCHC: 30.9 g/dL (ref 30.0–36.0)
MCV: 91.9 fL (ref 80.0–100.0)
Platelets: 258 10*3/uL (ref 150–400)
RBC: 3.56 MIL/uL — ABNORMAL LOW (ref 3.87–5.11)
RDW: 14.6 % (ref 11.5–15.5)
WBC: 10.1 10*3/uL (ref 4.0–10.5)
nRBC: 0 % (ref 0.0–0.2)

## 2018-04-27 MED ORDER — ACETAMINOPHEN 325 MG PO TABS
650.0000 mg | ORAL_TABLET | Freq: Three times a day (TID) | ORAL | Status: DC
  Administered 2018-04-27 – 2018-04-29 (×8): 650 mg via ORAL
  Filled 2018-04-27 (×10): qty 2

## 2018-04-27 MED ORDER — MELOXICAM 7.5 MG PO TABS
15.0000 mg | ORAL_TABLET | Freq: Every day | ORAL | Status: DC
  Administered 2018-04-27 – 2018-04-29 (×3): 15 mg via ORAL
  Filled 2018-04-27 (×3): qty 2

## 2018-04-27 MED ORDER — LEVOFLOXACIN 500 MG PO TABS: 500.0000 mg | ORAL_TABLET | Freq: Every day | ORAL | Status: DC

## 2018-04-27 MED ORDER — IPRATROPIUM-ALBUTEROL 0.5-2.5 (3) MG/3ML IN SOLN
3.0000 mL | Freq: Two times a day (BID) | RESPIRATORY_TRACT | Status: DC
  Administered 2018-04-27 – 2018-04-30 (×5): 3 mL via RESPIRATORY_TRACT
  Filled 2018-04-27 (×6): qty 3

## 2018-04-27 MED ORDER — LEVOFLOXACIN 500 MG PO TABS
250.0000 mg | ORAL_TABLET | Freq: Every day | ORAL | Status: DC
  Administered 2018-04-28 – 2018-04-29 (×2): 250 mg via ORAL
  Filled 2018-04-27 (×2): qty 1

## 2018-04-27 MED ORDER — OXYCODONE-ACETAMINOPHEN 5-325 MG PO TABS
1.0000 | ORAL_TABLET | ORAL | Status: DC | PRN
  Administered 2018-04-27 – 2018-04-30 (×5): 1 via ORAL
  Filled 2018-04-27 (×5): qty 1

## 2018-04-27 MED ORDER — LEVOFLOXACIN 500 MG PO TABS
500.0000 mg | ORAL_TABLET | Freq: Once | ORAL | Status: AC
  Administered 2018-04-27: 500 mg via ORAL
  Filled 2018-04-27: qty 1

## 2018-04-27 NOTE — Progress Notes (Signed)
Pharmacy Electrolyte Monitoring Consult:  Pharmacy consulted to assist in monitoring and replacing electrolytes in this 84 y.o. female admitted on 04/18/2018 with Fall   Labs:  Sodium (mmol/L)  Date Value  04/27/2018 138  06/02/2012 143   Potassium (mmol/L)  Date Value  04/27/2018 3.7  06/02/2012 3.4 (L)   Magnesium (mg/dL)  Date Value  04/26/2018 2.1   Phosphorus (mg/dL)  Date Value  04/24/2018 3.1   Calcium (mg/dL)  Date Value  04/27/2018 8.3 (L)   Calcium, Total (mg/dL)  Date Value  06/02/2012 9.2   Albumin (g/dL)  Date Value  04/18/2018 4.0  06/02/2012 3.9    Assessment/Plan: CCM consult for electrolyte management. Patient is no longer in CCU, not receiving IV diuresis, and MD has been ordering replacement. Pharmacy will sign off. Please re-consult if further assistance is desired.   Djibril Glogowski A. Jordan Hawks, PharmD, BCPS Clinical Pharmacist 04/27/2018 7:17 AM

## 2018-04-27 NOTE — Plan of Care (Signed)
  Problem: Clinical Measurements: Goal: Ability to maintain clinical measurements within normal limits will improve Outcome: Progressing   Problem: Elimination: Goal: Will not experience complications related to urinary retention Outcome: Progressing   Problem: Safety: Goal: Ability to remain free from injury will improve Outcome: Progressing   Problem: Clinical Measurements: Goal: Respiratory complications will improve Outcome: Not Progressing Note:  Patient requiring 4L oxygen via nasal cannula.

## 2018-04-27 NOTE — Progress Notes (Signed)
[facility] [place] NAME: Destiny Oliver    MR#:  [id number]  DATE OF BIRTH:  02-08-1927  SUBJECTIVE:  Continues to complain of intermittent shortness of breath, generalized weakness, fatigue, repeat chest x-ray is suspicious for infiltrate, clinically no evidence for pneumonia, will check procalcitonin level, check CT of the chest for further evaluation, continue to wean O2 off as tolerated  REVIEW OF SYSTEMS:  CONSTITUTIONAL: No fever, fatigue or weakness.  EYES: No blurred or double vision.  EARS, NOSE, AND THROAT: No tinnitus or ear pain.  RESPIRATORY: No cough, shortness of breath, wheezing or hemoptysis.  CARDIOVASCULAR: No chest pain, orthopnea, edema.  GASTROINTESTINAL: No nausea, vomiting, diarrhea or abdominal pain.  GENITOURINARY: No dysuria, hematuria.  ENDOCRINE: No polyuria, nocturia,  HEMATOLOGY: No anemia, easy bruising or bleeding SKIN: No rash or lesion. MUSCULOSKELETAL: No joint pain or arthritis.   NEUROLOGIC: No tingling, numbness, weakness.  PSYCHIATRY: No anxiety or depression.   ROS  DRUG ALLERGIES:   Allergies  Allergen Reactions  . Penicillins Swelling    VITALS:  Blood pressure 115/84, pulse (!) 112, temperature 97.8 F (36.6 C), temperature source Oral, resp. rate 18, height 5\' 1"  (1.549 m), weight 99.4 kg, SpO2 96 %.  PHYSICAL EXAMINATION:  GENERAL:  84 y.o.-year-old patient lying in the bed with no acute distress.  EYES: Pupils equal, round, reactive to light and accommodation. No scleral icterus. Extraocular muscles intact.  HEENT: Head atraumatic, normocephalic. Oropharynx and nasopharynx clear.  NECK:  Supple, no jugular venous distention. No thyroid enlargement, no tenderness.  LUNGS: Normal breath sounds bilaterally, no wheezing, rales,rhonchi or crepitation. No use of accessory muscles of respiration.  CARDIOVASCULAR: S1, S2 normal. No murmurs, rubs, or gallops.  ABDOMEN: Soft, nontender,  nondistended. Bowel sounds present. No organomegaly or mass.  EXTREMITIES: No pedal edema, cyanosis, or clubbing.  NEUROLOGIC: Cranial nerves II through XII are intact. Muscle strength 5/5 in all extremities. Sensation intact. Gait not checked.  PSYCHIATRIC: The patient is alert and oriented x 3.  SKIN: No obvious rash, lesion, or ulcer.   Physical Exam LABORATORY PANEL:   CBC Recent Labs  Lab 04/27/18 0320  WBC 10.1  HGB 10.1*  HCT 32.7*  PLT 258   ------------------------------------------------------------------------------------------------------------------  Chemistries  Recent Labs  Lab 04/26/18 0538 04/27/18 0320  NA 138 138  K 3.4* 3.7  CL 104 103  CO2 25 26  GLUCOSE 171* 122*  BUN 32* 35*  CREATININE 1.43* 1.43*  CALCIUM 8.4* 8.3*  MG 2.1  --    ------------------------------------------------------------------------------------------------------------------  Cardiac Enzymes No results for input(s): TROPONINI in the last 168 hours. ------------------------------------------------------------------------------------------------------------------  RADIOLOGY:  Dg Chest 2 View  Result Date: 04/26/2018 CLINICAL DATA:  Shortness of breath, wheezing, tachycardia EXAM: CHEST - 2 VIEW COMPARISON:  Chest x-ray of 04/25/2017 FINDINGS: There is cardiomegaly present with bilateral effusions most consistent with CHF. However there is opacity in the right upper lobe and peripherally at the left lung base and superimposed multifocal pneumonia can not be excluded. No bony abnormality is seen. Recommend follow-up two-view chest x-ray. IMPRESSION: 1. Cardiomegaly and bilateral effusions may indicate mild CHF. 2. However patchy opacities bilaterally particularly in the right upper lobe are suspicious for superimposed pneumonia and follow-up two-view chest x-ray is recommended. Electronically Signed   By: Ivar Drape M.D.   On: 04/26/2018 11:31    ASSESSMENT AND PLAN:   84 year old female with a history of breast cancer status post right breast mastectomy and hypertension  who presented to the hospital after a fall.  *Acute rhabdomyolysis due to fall Resolvedwith IV fluids for rehydration  *Acute kidney injury Improved Likely secondary to above as well as dehydration Discontinued home dose of lisinopril/hydrochlorothiazide,treated with IV fluids for rehydration, avoided nephrotoxic agents, nephrology did see patient while in house   Renal ultrasound was unimpressive  *Acute diastolic congestive heart failure exacerbation Resolved Secondary to numerous days of IV fluids for rehydration given rhabdomyolysis/acute kidney injury Echocardiogram with normal ejection fraction Continue CHF protocol, on Eliquis, aspirin   *Acute hypoxic respiratory failure Secondary to above as well as deconditioning Continue to wean O2 off as tolerated, most recent chest x-ray suspicious for infiltrate though no signs clinically-we will check procalcitonin level as well as CT chest for further evaluation   *Acute elevated troponin Secondary to demand ischemia Patient has ruled out for ACS  *Atrial fibrillation with RVR Dr. Kowalski/cardiology did see patient while in house-treated with amiodarone, Eliquis, diltiazem, and beta-blocker therapy  *Essential hypertension Stable on current regiment  *Acute insomnia Trazodone at bedtime  *Acute back pain Most likely secondary to osteoarthritis Scheduled Tylenol, Mobic daily  Disposition to skilled nursing facility in 1 to 2 days barring any complications   All the records are reviewed and case discussed with Care Management/Social Workerr. Management plans discussed with the patient, family and they are in agreement.  CODE STATUS:dnr  TOTAL TIME TAKING CARE OF THIS PATIENT: 35 minutes.     POSSIBLE D/C IN 1-2 DAYS, DEPENDING ON CLINICAL CONDITION.   [name]  M.D on 04/27/2018   Between 7am  to 6pm - Pager - [phone]  After 6pm go to [email or web address] - password [email or web address] [facility]  Office  [phone]  CC: Primary care physician; Juluis Pitch, MD  Note: This dictation was prepared with Dragon dictation along with smaller phrase technology. Any transcriptional errors that result from this process are unintentional.

## 2018-04-27 NOTE — Progress Notes (Signed)
BP 95/63. MD notified, Per MD Jannifer Franklin hold metoprolol, trazodone, and amiodarone. Will continue to monitor.

## 2018-04-27 NOTE — Progress Notes (Signed)
[facility]  ROUNDING NOTE   Subjective:  Patient resting comfortably in bed. Renal function stable with a creatinine of 1.43.  Objective:  Vital signs in last 24 hours:  Temp:  [97.8 F (36.6 C)-98.3 F (36.8 C)] 97.8 F (36.6 C) (12/28 0856) Pulse Rate:  [91-112] 112 (12/28 0856) Resp:  [18-22] 18 (12/28 0400) BP: (95-115)/(50-84) 115/84 (12/28 0856) SpO2:  [88 %-96 %] 96 % (12/28 0856) FiO2 (%):  [32 %] 32 % (12/27 2015) Weight:  [99.4 kg] 99.4 kg (12/28 0400)  Weight change: -0.091 kg Filed Weights   04/25/18 0453 04/26/18 0544 04/27/18 0400  Weight: 99.4 kg 99.5 kg 99.4 kg    Intake/Output: I/O last 3 completed shifts: In: 3 [I.V.:3] Out: 1250 [Urine:1250]   Intake/Output this shift:  No intake/output data recorded.  Physical Exam: General: No acute distress  Head: Facial bruising noted  Eyes: Anicteric  Neck: Supple, trachea midline  Lungs:  Basilar rales, normal effort  Heart: S1S2 no rubs  Abdomen:  Soft, nontender, bowel sounds present  Extremities: trace peripheral edema.  Neurologic: Awake, alert, following commands  Skin: Scattered ecchymoses noted       Basic Metabolic Panel: Recent Labs  Lab 04/21/18 0331 04/22/18 0325 04/23/18 0302 04/24/18 0601 04/25/18 0542 04/26/18 0538 04/27/18 0320  NA 139 139 140 138 137 138 138  K 4.5 4.1 4.1 4.2 3.9 3.4* 3.7  CL 111 109 107 107 103 104 103  CO2 21* 20* 25 24 25 25 26   GLUCOSE 125* 126* 128* 127* 123* 171* 122*  BUN 39* 39* 39* 30* 27* 32* 35*  CREATININE 2.07* 2.23* 2.29* 1.56* 1.36* 1.43* 1.43*  CALCIUM 8.4* 8.3* 8.2* 8.1* 8.4* 8.4* 8.3*  MG  --   --   --  2.0  --  2.1  --   PHOS 4.8* 5.2*  --  3.1  --   --   --     Liver Function Tests: No results for input(s): AST, ALT, ALKPHOS, BILITOT, PROT, ALBUMIN in the last 168 hours. No results for input(s): LIPASE, AMYLASE in the last 168 hours. No results for input(s): AMMONIA in the last 168 hours.  CBC: Recent Labs  Lab  04/27/18 0320  WBC 10.1  HGB 10.1*  HCT 32.7*  MCV 91.9  PLT 258    Cardiac Enzymes: No results for input(s): CKTOTAL, CKMB, CKMBINDEX, TROPONINI in the last 168 hours.  BNP: Invalid input(s): POCBNP  CBG: No results for input(s): GLUCAP in the last 168 hours.  Microbiology: Results for orders placed or performed during the hospital encounter of 04/18/18  MRSA PCR Screening     Status: None   Collection Time: 04/19/18  9:09 AM  Result Value Ref Range Status   MRSA by PCR NEGATIVE NEGATIVE Final    Comment:        The GeneXpert MRSA Assay (FDA approved for NASAL specimens only), is one component of a comprehensive MRSA colonization surveillance program. It is not intended to diagnose MRSA infection nor to guide or monitor treatment for MRSA infections. Performed at [facility], [address] [place], [place] [address]     Coagulation Studies: No results for input(s): LABPROT, INR in the last 72 hours.  Urinalysis: No results for input(s): COLORURINE, LABSPEC, PHURINE, GLUCOSEU, HGBUR, BILIRUBINUR, KETONESUR, PROTEINUR, UROBILINOGEN, NITRITE, LEUKOCYTESUR in the last 72 hours.  Invalid input(s): APPERANCEUR    Imaging: Dg Chest 2 View  Result Date: 04/26/2018 CLINICAL DATA:  Shortness of breath, wheezing,  tachycardia EXAM: CHEST - 2 VIEW COMPARISON:  Chest x-ray of 04/25/2017 FINDINGS: There is cardiomegaly present with bilateral effusions most consistent with CHF. However there is opacity in the right upper lobe and peripherally at the left lung base and superimposed multifocal pneumonia can not be excluded. No bony abnormality is seen. Recommend follow-up two-view chest x-ray. IMPRESSION: 1. Cardiomegaly and bilateral effusions may indicate mild CHF. 2. However patchy opacities bilaterally particularly in the right upper lobe are suspicious for superimposed pneumonia and follow-up two-view chest x-ray is recommended. Electronically Signed   By: Ivar Drape M.D.   On: 04/26/2018 11:31     Medications:    . acetaminophen  650 mg Oral TID  . amiodarone  400 mg Oral BID  . apixaban  2.5 mg Oral BID  . aspirin EC  81 mg Oral Daily  . budesonide (PULMICORT) nebulizer solution  0.5 mg Nebulization BID  . cholecalciferol  1,000 Units Oral Daily  . diltiazem  240 mg Oral Daily  . docusate sodium  100 mg Oral BID  . feeding supplement (ENSURE ENLIVE)  237 mL Oral BID BM  . ferrous sulfate  325 mg Oral Daily  . furosemide  20 mg Oral Daily  . ipratropium-albuterol  3 mL Nebulization TID  . lidocaine  1 patch Transdermal Q24H  . mouth rinse  15 mL Mouth Rinse BID  . meloxicam  15 mg Oral Daily  . metoprolol  100 mg Oral BID  . multivitamin with minerals  1 tablet Oral Daily  . pravastatin  40 mg Oral QODAY  . sodium chloride flush  10 mL Intravenous Q12H  . traZODone  100 mg Oral QHS  . cyanocobalamin  1,000 mcg Oral Daily   cyclobenzaprine, hydrALAZINE, ondansetron **OR** ondansetron (ZOFRAN) IV, oxyCODONE-acetaminophen, polyethylene glycol, simethicone  Assessment/ Plan:  84 y.o. female with hypertension, hyperlipidemia, GERD, breast cancer status post right mastectomy, cataracts, asthma, hemorrhoids, diabetes mellitus type 2, anemia of CKD.   1.  Acute renal failure/CKD stage IV baseline Cr 1.76. 2.  Rhabdomyolysis s/p prolonged fall. 3.  Hypertension. 4.  Anemia of CKD.   Plan:  Patient continues to do well from a renal perspective.  Creatinine remains low at 1.43.  Okay to continue Lasix as long as renal parameters remain stable.  Urine output was 1.1 L over the preceding 24 hours.  Hgb stable at 10.1.    LOS: 9 Destiny Oliver 12/28/20191:16 PM

## 2018-04-27 NOTE — Progress Notes (Signed)
On call MD notified to review CT chest results. I will continue to assess.

## 2018-04-27 NOTE — Progress Notes (Signed)
PHARMACY NOTE:  ANTIMICROBIAL RENAL DOSAGE ADJUSTMENT  Current antimicrobial regimen includes a mismatch between antimicrobial dosage and estimated renal function.  As per policy approved by the Pharmacy & Therapeutics and Medical Executive Committees, the antimicrobial dosage will be adjusted accordingly.  Current antimicrobial dosage:  Levaquin 500 mg PO daily   Indication:   Renal Function:  Estimated Creatinine Clearance: 28.8 mL/min (A) (by C-G formula based on SCr of 1.43 mg/dL (H)). []      On intermittent HD, scheduled: []      On CRRT    Antimicrobial dosage has been changed to:  Levaquin 500 mg PO X 1 for 12/28 @ 2000 followed by levaquin 250 mg PO daily to start on 12/29 @ 2000.   Additional comments:   Thank you for allowing pharmacy to be a part of this patient's care.  Orene Desanctis, [place] 04/27/2018 7:41 PM

## 2018-04-27 NOTE — Progress Notes (Signed)
MD notified. Pt still complaining of pain after prn pain medication. Orders to increase frequency received. I will continue to assess.

## 2018-04-28 LAB — CBC
HEMATOCRIT: 31.1 % — AB (ref 36.0–46.0)
Hemoglobin: 9.5 g/dL — ABNORMAL LOW (ref 12.0–15.0)
MCH: 28.3 pg (ref 26.0–34.0)
MCHC: 30.5 g/dL (ref 30.0–36.0)
MCV: 92.6 fL (ref 80.0–100.0)
Platelets: 253 10*3/uL (ref 150–400)
RBC: 3.36 MIL/uL — ABNORMAL LOW (ref 3.87–5.11)
RDW: 14.6 % (ref 11.5–15.5)
WBC: 8.1 10*3/uL (ref 4.0–10.5)
nRBC: 0 % (ref 0.0–0.2)

## 2018-04-28 MED ORDER — NON FORMULARY: 5.0000 mg | Freq: Every evening | Status: DC | PRN

## 2018-04-28 MED ORDER — MELATONIN 5 MG PO TABS
5.0000 mg | ORAL_TABLET | Freq: Every evening | ORAL | Status: DC | PRN
  Filled 2018-04-28: qty 1

## 2018-04-28 MED ORDER — APIXABAN 5 MG PO TABS
5.0000 mg | ORAL_TABLET | Freq: Two times a day (BID) | ORAL | Status: DC
  Administered 2018-04-28 – 2018-04-30 (×4): 5 mg via ORAL
  Filled 2018-04-28 (×4): qty 1

## 2018-04-28 NOTE — Progress Notes (Addendum)
[place] [facility] [place] NAME: Nabilah Davoli    MR#:  [id number]  DATE OF BIRTH:  [birth year]  SUBJECTIVE:  The patient feels better, but still has shortness of breath, on oxygen by nasal cannula 3.5 L. REVIEW OF SYSTEMS:  CONSTITUTIONAL: No fever, has generalized weakness.  EYES: No blurred or double vision.  EARS, NOSE, AND THROAT: No tinnitus or ear pain.  RESPIRATORY: No cough, has shortness of breath, no wheezing or hemoptysis.  CARDIOVASCULAR: No chest pain, orthopnea, edema.  GASTROINTESTINAL: No nausea, vomiting, diarrhea or abdominal pain.  GENITOURINARY: No dysuria, hematuria.  ENDOCRINE: No polyuria, nocturia,  HEMATOLOGY: No anemia, easy bruising or bleeding SKIN: No rash or lesion. MUSCULOSKELETAL: No joint pain or arthritis.   NEUROLOGIC: No tingling, numbness, weakness.  PSYCHIATRY: No anxiety or depression.   ROS  DRUG ALLERGIES:   Allergies  Allergen Reactions  . Penicillins Swelling    VITALS:  Blood pressure (!) 153/91, pulse 99, temperature 98.3 F (36.8 C), temperature source Oral, resp. rate (!) 22, height 5\' 1"  (1.549 m), weight 99.5 kg, SpO2 94 %.  PHYSICAL EXAMINATION:  GENERAL:  84 y.o.-year-old patient lying in the bed with no acute distress.  Morbid obesity. EYES: Pupils equal, round, reactive to light and accommodation. No scleral icterus. Extraocular muscles intact.  HEENT: Head atraumatic, normocephalic. Oropharynx and nasopharynx clear.  NECK:  Supple, no jugular venous distention. No thyroid enlargement, no tenderness.  LUNGS: Normal breath sounds bilaterally, no wheezing, rales,rhonchi or crepitation. No use of accessory muscles of respiration.  CARDIOVASCULAR: S1, S2 normal. No murmurs, rubs, or gallops.  ABDOMEN: Soft, nontender, nondistended. Bowel sounds present. No organomegaly or mass.  EXTREMITIES: No pedal edema, cyanosis, or clubbing.  NEUROLOGIC: Cranial nerves II through XII are intact.  Muscle strength 5/5 in all extremities. Sensation intact. Gait not checked.  PSYCHIATRIC: The patient is alert and oriented x 3.  SKIN: No obvious rash, lesion, or ulcer.   Physical Exam LABORATORY PANEL:   CBC Recent Labs  Lab 04/28/18 0308  WBC 8.1  HGB 9.5*  HCT 31.1*  PLT 253   ------------------------------------------------------------------------------------------------------------------  Chemistries  Recent Labs  Lab 04/26/18 0538 04/27/18 0320  NA 138 138  K 3.4* 3.7  CL 104 103  CO2 25 26  GLUCOSE 171* 122*  BUN 32* 35*  CREATININE 1.43* 1.43*  CALCIUM 8.4* 8.3*  MG 2.1  --    ------------------------------------------------------------------------------------------------------------------  Cardiac Enzymes No results for input(s): TROPONINI in the last 168 hours. ------------------------------------------------------------------------------------------------------------------  RADIOLOGY:  Ct Chest Wo Contrast  Result Date: 04/27/2018 CLINICAL DATA:  Intermittent dyspnea, weakness and fatigue. EXAM: CT CHEST WITHOUT CONTRAST TECHNIQUE: Multidetector CT imaging of the chest was performed following the standard protocol without IV contrast. COMPARISON:  CXR 04/26/2018 FINDINGS: Cardiovascular: Cardiomegaly with coronary arteriosclerosis along the LAD and RCA. No pericardial effusion or thickening. Scattered aortic atherosclerosis without aneurysm. The unopacified main pulmonary artery is slightly dilated up to 3.1 cm, nonspecific but can be seen in mild chronic pulmonary hypertension. Conventional branch pattern of the great vessels with minimal atherosclerosis. Mediastinum/Nodes: Scattered coarse calcifications noted within the left lobe of the thyroid gland which also extends slightly retroclavicular. The right lobe appears diminutive. No dominant mass. Mild lymphadenopathy along the right lower paratracheal portion of the mediastinum to 14 mm. This is likely  reactive. The trachea, mainstem bronchi and CT appearance of the esophagus appear unremarkable. Lungs/Pleura: Bilateral small left greater than right pleural effusions with adjacent atelectasis. Patchy  airspace opacities in the upper lobes, right greater than left are identified as well as superior segment of right lower lobe concerning for multilobar pneumonia and atelectasis. No pneumothorax. No dominant appearing mass. Upper Abdomen: Physiologic distention of the gallbladder possibly representing a fasting state. The included upper abdomen is nonacute. Musculoskeletal: Degenerative changes are present along the dorsal spine. IMPRESSION: 1. Small left greater than right pleural effusions with adjacent atelectasis. 2. Patchy airspace opacities in the upper lobes, right greater than left as well as superior segment of right lower lobe concerning for multilobar pneumonia and atelectasis. Followup PA and lateral chest X-ray is recommended in 3-4 weeks following trial of antibiotic therapy to ensure resolution and exclude underlying malignancy. 3. Mild reactive mediastinal lymphadenopathy. Aortic Atherosclerosis (ICD10-I70.0). Electronically Signed   By: Ashley Royalty M.D.   On: 04/27/2018 14:44    ASSESSMENT AND PLAN:  84 year old female with a history of breast cancer status post right breast mastectomy and hypertension who presented to the hospital after a fall.  *Acute rhabdomyolysis due to fall Resolvedwith IV fluids for rehydration  * Acute renal failure/CKD stage IV baseline Cr 1.76. Likely secondary to above as well as dehydration Discontinued home dose of lisinopril/hydrochlorothiazide,treated with IV fluids for rehydration, avoided nephrotoxic agents. Renal ultrasound was unimpressive. Improved better than baseline.  *Acute diastolic congestive heart failure exacerbation Secondary to numerous days of IV fluids for rehydration given rhabdomyolysis/acute kidney injury Echocardiogram with  normal ejection fraction Continue CHF protocol, on Eliquis, aspirin   *Acute hypoxic respiratory failure Secondary to above as well as deconditioning Continue to wean O2 off as tolerated, most recent chest x-ray suspicious for infiltrate though no signs clinically, normal procalcitonin level. CT chest: Patchy airspace opacities in the upper lobes, right greater than left as well as superior segment of right lower lobe concerning for multilobar pneumonia and atelectasis.  Continue Levaquin levaquin.   *Acute elevated troponin Secondary to demand ischemia Patient has ruled out for ACS  *Atrial fibrillation with RVR Rate controlled with amiodarone, Eliquis, diltiazem, and beta-blocker therapy  *Essential hypertension Stable on current regiment  *Acute insomnia Trazodone at bedtime  *Acute back pain Most likely secondary to osteoarthritis Scheduled Tylenol, Mobic daily.  PT and ambulate the patient. All the records are reviewed and case discussed with Care Management/Social Workerr. Management plans discussed with the patient, family and they are in agreement.  CODE STATUS:dnr  TOTAL TIME TAKING CARE OF THIS PATIENT: 30 minutes.  POSSIBLE D/C IN 1-2 DAYS, DEPENDING ON CLINICAL CONDITION.   Demetrios Loll M.D on 04/28/2018   Between 7am to 6pm - Pager - [phone]  After 6pm go to [email or web address] - password [email or web address] [facility]  Office  [phone]  CC: Primary care physician; Juluis Pitch, MD  Note: This dictation was prepared with Dragon dictation along with smaller phrase technology. Any transcriptional errors that result from this process are unintentional.

## 2018-04-28 NOTE — Plan of Care (Signed)
  Problem: Clinical Measurements: Goal: Respiratory complications will improve Outcome: Not Progressing Note:  Patient on 4L nasal cannula. Receiving PO Levaquin for pneumonia.

## 2018-04-28 NOTE — Progress Notes (Signed)
Pharmacist - Prescriber Communication  Apixaban dose has been modified from 2.5 mg po BID to 5 mg po BID due to improved renal function. Indication is AF and patient only meets one dose reduction criteria (SCr >1.5, Age >80, ABW <60) and two are required for lower dose.  Hyde Sires A. Jordan Hawks, PharmD, BCPS Clinical Pharmacist 04/28/2018 11:01

## 2018-04-28 NOTE — Progress Notes (Signed)
[facility]  ROUNDING NOTE   Subjective:  No new renal function testing available today. Yesterday creatinine was 1.43. Patient overall still quite weak. Still awaiting placement at rehabilitation center.  Objective:  Vital signs in last 24 hours:  Temp:  [97.5 F (36.4 C)-98.3 F (36.8 C)] 98.3 F (36.8 C) (12/29 0404) Pulse Rate:  [84-111] 99 (12/29 0819) Resp:  [19-22] 22 (12/29 0819) BP: (109-153)/(73-91) 153/91 (12/29 0819) SpO2:  [93 %-96 %] 94 % (12/29 0819) Weight:  [99.5 kg] 99.5 kg (12/29 0404)  Weight change: 0.091 kg Filed Weights   04/26/18 0544 04/27/18 0400 04/28/18 0404  Weight: 99.5 kg 99.4 kg 99.5 kg    Intake/Output: I/O last 3 completed shifts: In: -  Out: 1500 [Urine:1500]   Intake/Output this shift:  No intake/output data recorded.  Physical Exam: General: No acute distress  Head: Facial bruising noted  Eyes: Anicteric  Neck: Supple, trachea midline  Lungs:  CTAB, normal effort  Heart: S1S2 no rubs  Abdomen:  Soft, nontender, bowel sounds present  Extremities: trace peripheral edema.  Neurologic: Awake, alert, following commands  Skin: Scattered ecchymoses noted       Basic Metabolic Panel: Recent Labs  Lab 04/22/18 0325 04/23/18 0302 04/24/18 0601 04/25/18 0542 04/26/18 0538 04/27/18 0320  NA 139 140 138 137 138 138  K 4.1 4.1 4.2 3.9 3.4* 3.7  CL 109 107 107 103 104 103  CO2 20* 25 24 25 25 26   GLUCOSE 126* 128* 127* 123* 171* 122*  BUN 39* 39* 30* 27* 32* 35*  CREATININE 2.23* 2.29* 1.56* 1.36* 1.43* 1.43*  CALCIUM 8.3* 8.2* 8.1* 8.4* 8.4* 8.3*  MG  --   --  2.0  --  2.1  --   PHOS 5.2*  --  3.1  --   --   --     Liver Function Tests: No results for input(s): AST, ALT, ALKPHOS, BILITOT, PROT, ALBUMIN in the last 168 hours. No results for input(s): LIPASE, AMYLASE in the last 168 hours. No results for input(s): AMMONIA in the last 168 hours.  CBC: Recent Labs  Lab 04/27/18 0320 04/28/18 0308  WBC  10.1 8.1  HGB 10.1* 9.5*  HCT 32.7* 31.1*  MCV 91.9 92.6  PLT 258 253    Cardiac Enzymes: No results for input(s): CKTOTAL, CKMB, CKMBINDEX, TROPONINI in the last 168 hours.  BNP: Invalid input(s): POCBNP  CBG: No results for input(s): GLUCAP in the last 168 hours.  Microbiology: Results for orders placed or performed during the hospital encounter of 04/18/18  MRSA PCR Screening     Status: None   Collection Time: 04/19/18  9:09 AM  Result Value Ref Range Status   MRSA by PCR NEGATIVE NEGATIVE Final    Comment:        The GeneXpert MRSA Assay (FDA approved for NASAL specimens only), is one component of a comprehensive MRSA colonization surveillance program. It is not intended to diagnose MRSA infection nor to guide or monitor treatment for MRSA infections. Performed at [facility], [address] [place], [place] [address]     Coagulation Studies: No results for input(s): LABPROT, INR in the last 72 hours.  Urinalysis: No results for input(s): COLORURINE, LABSPEC, PHURINE, GLUCOSEU, HGBUR, BILIRUBINUR, KETONESUR, PROTEINUR, UROBILINOGEN, NITRITE, LEUKOCYTESUR in the last 72 hours.  Invalid input(s): APPERANCEUR    Imaging: Ct Chest Wo Contrast  Result Date: 04/27/2018 CLINICAL DATA:  Intermittent dyspnea, weakness and fatigue. EXAM: CT CHEST WITHOUT CONTRAST TECHNIQUE: Multidetector  CT imaging of the chest was performed following the standard protocol without IV contrast. COMPARISON:  CXR 04/26/2018 FINDINGS: Cardiovascular: Cardiomegaly with coronary arteriosclerosis along the LAD and RCA. No pericardial effusion or thickening. Scattered aortic atherosclerosis without aneurysm. The unopacified main pulmonary artery is slightly dilated up to 3.1 cm, nonspecific but can be seen in mild chronic pulmonary hypertension. Conventional branch pattern of the great vessels with minimal atherosclerosis. Mediastinum/Nodes: Scattered coarse calcifications noted within  the left lobe of the thyroid gland which also extends slightly retroclavicular. The right lobe appears diminutive. No dominant mass. Mild lymphadenopathy along the right lower paratracheal portion of the mediastinum to 14 mm. This is likely reactive. The trachea, mainstem bronchi and CT appearance of the esophagus appear unremarkable. Lungs/Pleura: Bilateral small left greater than right pleural effusions with adjacent atelectasis. Patchy airspace opacities in the upper lobes, right greater than left are identified as well as superior segment of right lower lobe concerning for multilobar pneumonia and atelectasis. No pneumothorax. No dominant appearing mass. Upper Abdomen: Physiologic distention of the gallbladder possibly representing a fasting state. The included upper abdomen is nonacute. Musculoskeletal: Degenerative changes are present along the dorsal spine. IMPRESSION: 1. Small left greater than right pleural effusions with adjacent atelectasis. 2. Patchy airspace opacities in the upper lobes, right greater than left as well as superior segment of right lower lobe concerning for multilobar pneumonia and atelectasis. Followup PA and lateral chest X-ray is recommended in 3-4 weeks following trial of antibiotic therapy to ensure resolution and exclude underlying malignancy. 3. Mild reactive mediastinal lymphadenopathy. Aortic Atherosclerosis (ICD10-I70.0). Electronically Signed   By: Ashley Royalty M.D.   On: 04/27/2018 14:44     Medications:    . acetaminophen  650 mg Oral TID  . amiodarone  400 mg Oral BID  . apixaban  5 mg Oral BID  . aspirin EC  81 mg Oral Daily  . budesonide (PULMICORT) nebulizer solution  0.5 mg Nebulization BID  . cholecalciferol  1,000 Units Oral Daily  . diltiazem  240 mg Oral Daily  . docusate sodium  100 mg Oral BID  . feeding supplement (ENSURE ENLIVE)  237 mL Oral BID BM  . ferrous sulfate  325 mg Oral Daily  . furosemide  20 mg Oral Daily  . ipratropium-albuterol  3  mL Nebulization BID  . levofloxacin  250 mg Oral Daily  . lidocaine  1 patch Transdermal Q24H  . mouth rinse  15 mL Mouth Rinse BID  . meloxicam  15 mg Oral Daily  . metoprolol  100 mg Oral BID  . multivitamin with minerals  1 tablet Oral Daily  . pravastatin  40 mg Oral QODAY  . sodium chloride flush  10 mL Intravenous Q12H  . traZODone  100 mg Oral QHS  . cyanocobalamin  1,000 mcg Oral Daily   cyclobenzaprine, hydrALAZINE, ondansetron **OR** ondansetron (ZOFRAN) IV, oxyCODONE-acetaminophen, polyethylene glycol, simethicone  Assessment/ Plan:  84 y.o. female with hypertension, hyperlipidemia, GERD, breast cancer status post right mastectomy, cataracts, asthma, hemorrhoids, diabetes mellitus type 2, anemia of CKD.   1.  Acute renal failure/CKD stage IV baseline Cr 1.76. 2.  Rhabdomyolysis s/p prolonged fall. 3.  Hypertension. 4.  Anemia of CKD.   Plan:  Patient continues to do well from a renal perspective.  No new renal function testing available today.  Rhabdomyolysis now resolved.  Continue metoprolol for blood pressure control.  Monitor renal parameters closely while the patient remains on Lasix.  It appears that she will  be placed in a rehabilitation facility within the next several days.    LOS: 10 Ester Mabe 12/29/20191:23 PM

## 2018-04-29 DIAGNOSIS — M6282 Rhabdomyolysis: Secondary | ICD-10-CM

## 2018-04-29 MED ORDER — FUROSEMIDE 20 MG PO TABS
20.0000 mg | ORAL_TABLET | Freq: Two times a day (BID) | ORAL | Status: DC
  Administered 2018-04-29 – 2018-04-30 (×3): 20 mg via ORAL
  Filled 2018-04-29 (×3): qty 1

## 2018-04-29 NOTE — Progress Notes (Signed)
Nutrition Follow Up Note   DOCUMENTATION CODES:   Obesity unspecified  INTERVENTION:   Continue Ensure Enlive po BID, each supplement provides 350 kcal and 20 grams of protein (patient likes vanilla)  Continue MVI  Liberalize diet   NUTRITION DIAGNOSIS:   Increased nutrient needs related to acute illness as evidenced by increased estimated needs.  GOAL:   Patient will meet greater than or equal to 90% of their needs -progressing   MONITOR:   PO intake, Supplement acceptance, Skin, I & O's, Labs, weights  ASSESSMENT:   84 y.o. female with hypertension, hyperlipidemia, GERD, breast cancer status post right mastectomy, cataracts, asthma, hemorrhoids, diabetes mellitus type 2, anemia of CKD admitted with rhabdomyolysis secondary to fall, AKI and PNA   Pt with fairly good appetite and oral intake since admit; pt eating 50-100% of meals. Pt ate 100% of her breakfast this morning which included scrambled eggs, toast and orange juice. Pt is also drinking some Ensure. Per chart, pt up ~27lbs since admit. Pt found to have pulmonary edema and PNA and is +4L on her I & O's. Recommend continue supplements and MVI. RD will liberalize diet as a heart healthy diet is restrictive of protein.    Medications reviewed and include: aspirin, vitamin D, colace, ferrous sulfate, lasix, MVI, B12  Labs reviewed: BUN 35(H), creat 1.43(H)- 12/28 P 3.1 wnl- 12/25 Mg 2.1 wnl- 12/27 BNP- 1222(H)- 12/26 Hgb 9.5(L), Hct 31.1(L)- 12/29  Diet Order:   Diet Order            Diet - low sodium heart healthy        Diet Heart Room service appropriate? Yes; Fluid consistency: Thin; Fluid restriction: 1500 mL Fluid  Diet effective now             EDUCATION NEEDS:   No education needs have been identified at this time  Skin:  abrasion; ecchymosis; BL: Face; arm; hip  Last BM:  12/29- type 5  Height:   Ht Readings from Last 1 Encounters:  04/20/18 5\' 1"  (1.549 m)    Weight:   Wt Readings  from Last 1 Encounters:  04/29/18 99.2 kg    Ideal Body Weight:  50 kg  BMI:  Body mass index is 41.34 kg/m.  Estimated Nutritional Needs:   Kcal:  1600-1800kcal/day   Protein:  87-95g/day   Fluid:  1.4L/day   Koleen Distance MS, RD, LDN Pager #- [phone] Office#- [phone] After Hours Pager: [phone]

## 2018-04-29 NOTE — Progress Notes (Signed)
[facility]  ROUNDING NOTE   Subjective:   Patient states she feels weak. Denies pain.   3.5 L [place] O2  Objective:  Vital signs in last 24 hours:  Temp:  [97.7 F (36.5 C)-97.9 F (36.6 C)] 97.9 F (36.6 C) (12/30 0753) Pulse Rate:  [85-94] 92 (12/30 0753) Resp:  [18-20] 18 (12/30 0753) BP: (115-121)/(63-81) 121/81 (12/30 0753) SpO2:  [92 %-96 %] 96 % (12/30 0753) Weight:  [99.2 kg] 99.2 kg (12/30 0436)  Weight change: -0.227 kg Filed Weights   04/27/18 0400 04/28/18 0404 04/29/18 0436  Weight: 99.4 kg 99.5 kg 99.2 kg    Intake/Output: I/O last 3 completed shifts: In: -  Out: 1250 [Urine:1250]   Intake/Output this shift:  No intake/output data recorded.  Physical Exam: General: No acute distress  Head: +ecchymosis  Eyes: Anicteric  Neck: Supple, trachea midline  Lungs:  clear  Heart: regular  Abdomen:  Soft, nontender, bowel sounds present  Extremities: No peripheral edema.  Neurologic: Awake, alert, following commands  Skin: +ecchymoses        Basic Metabolic Panel: Recent Labs  Lab 04/23/18 0302 04/24/18 0601 04/25/18 0542 04/26/18 0538 04/27/18 0320  NA 140 138 137 138 138  K 4.1 4.2 3.9 3.4* 3.7  CL 107 107 103 104 103  CO2 25 24 25 25 26   GLUCOSE 128* 127* 123* 171* 122*  BUN 39* 30* 27* 32* 35*  CREATININE 2.29* 1.56* 1.36* 1.43* 1.43*  CALCIUM 8.2* 8.1* 8.4* 8.4* 8.3*  MG  --  2.0  --  2.1  --   PHOS  --  3.1  --   --   --     Liver Function Tests: No results for input(s): AST, ALT, ALKPHOS, BILITOT, PROT, ALBUMIN in the last 168 hours. No results for input(s): LIPASE, AMYLASE in the last 168 hours. No results for input(s): AMMONIA in the last 168 hours.  CBC: Recent Labs  Lab 04/27/18 0320 04/28/18 0308  WBC 10.1 8.1  HGB 10.1* 9.5*  HCT 32.7* 31.1*  MCV 91.9 92.6  PLT 258 253    Cardiac Enzymes: No results for input(s): CKTOTAL, CKMB, CKMBINDEX, TROPONINI in the last 168 hours.  BNP: Invalid input(s):  POCBNP  CBG: No results for input(s): GLUCAP in the last 168 hours.  Microbiology: Results for orders placed or performed during the hospital encounter of 04/18/18  MRSA PCR Screening     Status: None   Collection Time: 04/19/18  9:09 AM  Result Value Ref Range Status   MRSA by PCR NEGATIVE NEGATIVE Final    Comment:        The GeneXpert MRSA Assay (FDA approved for NASAL specimens only), is one component of a comprehensive MRSA colonization surveillance program. It is not intended to diagnose MRSA infection nor to guide or monitor treatment for MRSA infections. Performed at [facility], [address] [place], [place] [address]     Coagulation Studies: No results for input(s): LABPROT, INR in the last 72 hours.  Urinalysis: No results for input(s): COLORURINE, LABSPEC, PHURINE, GLUCOSEU, HGBUR, BILIRUBINUR, KETONESUR, PROTEINUR, UROBILINOGEN, NITRITE, LEUKOCYTESUR in the last 72 hours.  Invalid input(s): APPERANCEUR    Imaging: Ct Chest Wo Contrast  Result Date: 04/27/2018 CLINICAL DATA:  Intermittent dyspnea, weakness and fatigue. EXAM: CT CHEST WITHOUT CONTRAST TECHNIQUE: Multidetector CT imaging of the chest was performed following the standard protocol without IV contrast. COMPARISON:  CXR 04/26/2018 FINDINGS: Cardiovascular: Cardiomegaly with coronary arteriosclerosis along the LAD and RCA.  No pericardial effusion or thickening. Scattered aortic atherosclerosis without aneurysm. The unopacified main pulmonary artery is slightly dilated up to 3.1 cm, nonspecific but can be seen in mild chronic pulmonary hypertension. Conventional branch pattern of the great vessels with minimal atherosclerosis. Mediastinum/Nodes: Scattered coarse calcifications noted within the left lobe of the thyroid gland which also extends slightly retroclavicular. The right lobe appears diminutive. No dominant mass. Mild lymphadenopathy along the right lower paratracheal portion of the  mediastinum to 14 mm. This is likely reactive. The trachea, mainstem bronchi and CT appearance of the esophagus appear unremarkable. Lungs/Pleura: Bilateral small left greater than right pleural effusions with adjacent atelectasis. Patchy airspace opacities in the upper lobes, right greater than left are identified as well as superior segment of right lower lobe concerning for multilobar pneumonia and atelectasis. No pneumothorax. No dominant appearing mass. Upper Abdomen: Physiologic distention of the gallbladder possibly representing a fasting state. The included upper abdomen is nonacute. Musculoskeletal: Degenerative changes are present along the dorsal spine. IMPRESSION: 1. Small left greater than right pleural effusions with adjacent atelectasis. 2. Patchy airspace opacities in the upper lobes, right greater than left as well as superior segment of right lower lobe concerning for multilobar pneumonia and atelectasis. Followup PA and lateral chest X-ray is recommended in 3-4 weeks following trial of antibiotic therapy to ensure resolution and exclude underlying malignancy. 3. Mild reactive mediastinal lymphadenopathy. Aortic Atherosclerosis (ICD10-I70.0). Electronically Signed   By: Ashley Royalty M.D.   On: 04/27/2018 14:44     Medications:    . acetaminophen  650 mg Oral TID  . amiodarone  400 mg Oral BID  . apixaban  5 mg Oral BID  . aspirin EC  81 mg Oral Daily  . budesonide (PULMICORT) nebulizer solution  0.5 mg Nebulization BID  . cholecalciferol  1,000 Units Oral Daily  . diltiazem  240 mg Oral Daily  . docusate sodium  100 mg Oral BID  . feeding supplement (ENSURE ENLIVE)  237 mL Oral BID BM  . ferrous sulfate  325 mg Oral Daily  . furosemide  20 mg Oral BID  . ipratropium-albuterol  3 mL Nebulization BID  . levofloxacin  250 mg Oral Daily  . lidocaine  1 patch Transdermal Q24H  . mouth rinse  15 mL Mouth Rinse BID  . meloxicam  15 mg Oral Daily  . metoprolol  100 mg Oral BID  .  multivitamin with minerals  1 tablet Oral Daily  . pravastatin  40 mg Oral QODAY  . sodium chloride flush  10 mL Intravenous Q12H  . traZODone  100 mg Oral QHS  . cyanocobalamin  1,000 mcg Oral Daily   cyclobenzaprine, hydrALAZINE, Melatonin, ondansetron **OR** ondansetron (ZOFRAN) IV, oxyCODONE-acetaminophen, polyethylene glycol, simethicone  Assessment/ Plan:  84 y.o. female with hypertension, hyperlipidemia, GERD, breast cancer status post right mastectomy, cataracts, asthma, hemorrhoids, diabetes mellitus type 2, anemia of CKD.   1.  Acute renal failure/CKD stage IV baseline Cr 1.76. GFR of 25.  2.  Rhabdomyolysis s/p prolonged fall. 3.  Hypertension. 4.  Anemia of CKD.   Plan:  Continue to encourage PO intake.    LOS: [place] 12/30/20198:51 AM

## 2018-04-29 NOTE — Care Management Important Message (Signed)
Copy of signed IM left with patient in room.  

## 2018-04-29 NOTE — Progress Notes (Signed)
Physical Therapy Treatment Patient Details Name: Destiny Oliver MRN: [id number] DOB: [birth year] Today's Date: 04/29/2018    History of Present Illness 84 yo female with onset of a-fib and fall at home, with rhabdomyolysis, was admitted.  Cleared for facial fractures, very bruised.  Had elevated troponin but was deemed demand ischemia.  PMHx:  R breast CA, HTN, tachycardia, recent SOB (PM of 04/24/2018), work up showed acute diastolic CHF exacerbation.    PT Comments    Ms. Bembenek made good progress with OOB mobility this session.  She ambulated 30 ft, limited by fatigue.  SpO2 and HR remained stable throughout session with pt on 4L O2.  Pt reports feeling "swirly" since yesterday when she stands up.  Orthostatics negative this session, RN made aware.  Given pt's current mobility status, recommending SNF at d/c.      Follow Up Recommendations  SNF     Equipment Recommendations  Rolling walker with 5" wheels    Recommendations for Other Services       Precautions / Restrictions Precautions Precautions: Fall;Other (comment) Precaution Comments: O2 Restrictions Weight Bearing Restrictions: No    Mobility  Bed Mobility Overal bed mobility: Needs Assistance Bed Mobility: Supine to Sit     Supine to sit: Min assist;HOB elevated     General bed mobility comments: Cues for sequencing and assist to elevate trunk with increased time and effort.  Pt uses bed rail.   Transfers Overall transfer level: Needs assistance Equipment used: Rolling walker (2 wheeled) Transfers: Sit to/from Stand Sit to Stand: Min assist         General transfer comment: Cues to scoot to EOB for ease with sit>stand.  Cues for proper hand placement.  Assist to boost to standing.   Ambulation/Gait Ambulation/Gait assistance: Min guard Gait Distance (Feet): 30 Feet Assistive device: Rolling walker (2 wheeled) Gait Pattern/deviations: Decreased step length - right;Decreased step length - left;Trunk  flexed Gait velocity: decreased   General Gait Details: Flexed posture and poor foot clearance Bil.     Stairs             Wheelchair Mobility    Modified Rankin (Stroke Patients Only)       Balance Overall balance assessment: Needs assistance Sitting-balance support: No upper extremity supported;Feet supported Sitting balance-Leahy Scale: Fair     Standing balance support: Single extremity supported;During functional activity Standing balance-Leahy Scale: Poor Standing balance comment: Pt relies on at least 1UE support for static and dynamic activities                            Cognition Arousal/Alertness: Awake/alert Behavior During Therapy: WFL for tasks assessed/performed Overall Cognitive Status: Within Functional Limits for tasks assessed                                        Exercises      General Comments General comments (skin integrity, edema, etc.): Pt reports that since yesterday she has been feeling "swirly" when she stands up.  Orthostatics taken: supine 98/62, sitting EOB 117/79, standing 124/75, sitting at end of session 116/61.  Pt is symptomatic in sitting (10/10 dizzy) more so than standing.  Both resolve within ~1 minute of maintaining position.  RN notified.  SpO2 remains at or above 91% on 4L O2 while ambulating, at or above 94% on 4L O2 with  all other activity.  Cues for pursed lip breathing with activity.  HR remains stable in the 80s-90s throughout session.       Pertinent Vitals/Pain Pain Assessment: No/denies pain    Home Living                      Prior Function            PT Goals (current goals can now be found in the care plan section) Acute Rehab PT Goals Patient Stated Goal: to improve mobility, to go to rehab soon PT Goal Formulation: With patient Time For Goal Achievement: 05/05/18 Potential to Achieve Goals: Good Progress towards PT goals: Progressing toward goals     Frequency    Min 2X/week      PT Plan Current plan remains appropriate    Co-evaluation              AM-PAC PT "6 Clicks" Mobility   Outcome Measure  Help needed turning from your back to your side while in a flat bed without using bedrails?: A Lot Help needed moving from lying on your back to sitting on the side of a flat bed without using bedrails?: A Lot Help needed moving to and from a bed to a chair (including a wheelchair)?: A Little Help needed standing up from a chair using your arms (e.g., wheelchair or bedside chair)?: A Little Help needed to walk in hospital room?: A Little Help needed climbing 3-5 steps with a railing? : A Lot 6 Click Score: 15    End of Session Equipment Utilized During Treatment: Gait belt;Oxygen Activity Tolerance: Patient limited by fatigue;Patient tolerated treatment well Patient left: in chair;with call bell/phone within reach;with chair alarm set;with SCD's reapplied Nurse Communication: Mobility status;Other (comment)(negative orthostatics, pt feels "swirly" with position chang) PT Visit Diagnosis: Unsteadiness on feet (R26.81);Muscle weakness (generalized) (M62.81);Difficulty in walking, not elsewhere classified (R26.2);Adult, failure to thrive (R62.7)     Time: 8453-6468 PT Time Calculation (min) (ACUTE ONLY): 23 min  Charges:  $Gait Training: 8-22 mins $Therapeutic Activity: 8-22 mins                     Collie Siad PT, DPT 04/29/2018, 11:37 AM

## 2018-04-29 NOTE — Progress Notes (Signed)
[place] [facility] [place] NAME: Destiny Oliver    MR#:  [id number]  DATE OF BIRTH:  December 16, 1926  SUBJECTIVE:  The patient has better shortness of breath but feels dizzy while stand up, on oxygen by nasal cannula 4 L.  Orthostatic vital signs unremarkable. REVIEW OF SYSTEMS:  CONSTITUTIONAL: No fever, has generalized weakness and dizziness.  EYES: No blurred or double vision.  EARS, NOSE, AND THROAT: No tinnitus or ear pain.  RESPIRATORY: No cough, has shortness of breath, no wheezing or hemoptysis.  CARDIOVASCULAR: No chest pain, orthopnea, edema.  GASTROINTESTINAL: No nausea, vomiting, diarrhea or abdominal pain.  GENITOURINARY: No dysuria, hematuria.  ENDOCRINE: No polyuria, nocturia,  HEMATOLOGY: No anemia, easy bruising or bleeding SKIN: No rash or lesion. MUSCULOSKELETAL: No joint pain or arthritis.   NEUROLOGIC: No tingling, numbness, weakness.  PSYCHIATRY: No anxiety or depression.   ROS  DRUG ALLERGIES:   Allergies  Allergen Reactions  . Penicillins Swelling    VITALS:  Blood pressure 121/81, pulse 92, temperature 97.9 F (36.6 C), temperature source Oral, resp. rate 18, height 5\' 1"  (1.549 m), weight 99.2 kg, SpO2 96 %.  PHYSICAL EXAMINATION:  GENERAL:  84 y.o.-year-old patient lying in the bed with no acute distress.  Morbid obesity. EYES: Pupils equal, round, reactive to light and accommodation. No scleral icterus. Extraocular muscles intact.  HEENT: Head atraumatic, normocephalic. Oropharynx and nasopharynx clear.  NECK:  Supple, no jugular venous distention. No thyroid enlargement, no tenderness.  LUNGS: Normal breath sounds bilaterally, no wheezing, rales,rhonchi or crepitation. No use of accessory muscles of respiration.  CARDIOVASCULAR: S1, S2 normal. No murmurs, rubs, or gallops.  ABDOMEN: Soft, nontender, nondistended. Bowel sounds present. No organomegaly or mass.  EXTREMITIES: No pedal edema, cyanosis, or clubbing.   NEUROLOGIC: Cranial nerves II through XII are intact. Muscle strength 5/5 in all extremities. Sensation intact. Gait not checked.  PSYCHIATRIC: The patient is alert and oriented x 3.  SKIN: No obvious rash, lesion, or ulcer.   Physical Exam LABORATORY PANEL:   CBC Recent Labs  Lab 04/28/18 0308  WBC 8.1  HGB 9.5*  HCT 31.1*  PLT 253   ------------------------------------------------------------------------------------------------------------------  Chemistries  Recent Labs  Lab 04/26/18 0538 04/27/18 0320  NA 138 138  K 3.4* 3.7  CL 104 103  CO2 25 26  GLUCOSE 171* 122*  BUN 32* 35*  CREATININE 1.43* 1.43*  CALCIUM 8.4* 8.3*  MG 2.1  --    ------------------------------------------------------------------------------------------------------------------  Cardiac Enzymes No results for input(s): TROPONINI in the last 168 hours. ------------------------------------------------------------------------------------------------------------------  RADIOLOGY:  No results found.  ASSESSMENT AND PLAN:  84 year old female with a history of breast cancer status post right breast mastectomy and hypertension who presented to the hospital after a fall.  *Acute rhabdomyolysis due to fall Resolvedwith IV fluids for rehydration  * Acute renal failure/CKD stage IV baseline Cr 1.76. Likely secondary to above as well as dehydration Discontinued home dose of lisinopril/hydrochlorothiazide,treated with IV fluids for rehydration, avoided nephrotoxic agents. Renal ultrasound was unimpressive. Improved better than baseline.  *Acute diastolic congestive heart failure exacerbation Secondary to numerous days of IV fluids for rehydration given rhabdomyolysis/acute kidney injury Echocardiogram with normal ejection fraction Continue CHF protocol, on Eliquis, aspirin  Increase Lasix to 20 mg p.o. twice daily.  *Acute hypoxic respiratory failure Secondary to above as well as  deconditioning Continue to wean O2 off as tolerated, most recent chest x-ray suspicious for infiltrate though no signs clinically, normal procalcitonin level. CT  chest: Patchy airspace opacities in the upper lobes, right greater than left as well as superior segment of right lower lobe concerning for multilobar pneumonia and atelectasis.  Continue Levaquin and Increase Lasix to 20 mg p.o. twice daily.   *Acute elevated troponin Secondary to demand ischemia Patient has ruled out for ACS  *Atrial fibrillation with RVR Rate controlled with amiodarone, Eliquis, diltiazem, and beta-blocker therapy  *Essential hypertension Stable on current regiment  *Acute insomnia Trazodone at bedtime  *Acute back pain Most likely secondary to osteoarthritis Scheduled Tylenol, Mobic daily.  PT and ambulate the patient. PT reevaluation suggest skilled nursing facility placement. All the records are reviewed and case discussed with Care Management/Social Workerr. Management plans discussed with the patient, family and they are in agreement.  CODE STATUS:dnr  TOTAL TIME TAKING CARE OF THIS PATIENT: 28 minutes.  POSSIBLE D/C IN 1-2 DAYS, DEPENDING ON CLINICAL CONDITION.   Demetrios Loll M.D on 04/29/2018   Between 7am to 6pm - Pager - [phone]  After 6pm go to [email or web address] - password [email or web address] [facility]  Office  [phone]  CC: Primary care physician; Juluis Pitch, MD  Note: This dictation was prepared with Dragon dictation along with smaller phrase technology. Any transcriptional errors that result from this process are unintentional.

## 2018-04-30 LAB — CREATININE, SERUM
Creatinine, Ser: 1.63 mg/dL — ABNORMAL HIGH (ref 0.44–1.00)
GFR calc Af Amer: 32 mL/min — ABNORMAL LOW (ref >60)
GFR calc non Af Amer: 28 mL/min — ABNORMAL LOW (ref >60)

## 2018-04-30 LAB — CBC
HCT: 29.1 % — ABNORMAL LOW (ref 36.0–46.0)
Hemoglobin: 9.5 g/dL — ABNORMAL LOW (ref 12.0–15.0)
MCH: 30.1 pg (ref 26.0–34.0)
MCHC: 32.6 g/dL (ref 30.0–36.0)
MCV: 92.1 fL (ref 80.0–100.0)
Platelets: 276 10*3/uL (ref 150–400)
RBC: 3.16 MIL/uL — ABNORMAL LOW (ref 3.87–5.11)
RDW: 14.6 % (ref 11.5–15.5)
WBC: 6.2 10*3/uL (ref 4.0–10.5)
nRBC: 0 % (ref 0.0–0.2)

## 2018-04-30 MED ORDER — BUDESONIDE 0.5 MG/2ML IN SUSP: 0.5000 mg | Freq: Two times a day (BID) | RESPIRATORY_TRACT | 12 refills | Status: DC

## 2018-04-30 MED ORDER — APIXABAN 2.5 MG PO TABS: 2.5000 mg | ORAL_TABLET | Freq: Two times a day (BID) | ORAL | Status: AC

## 2018-04-30 MED ORDER — AMIODARONE HCL 200 MG PO TABS
400.0000 mg | ORAL_TABLET | Freq: Every day | ORAL | Status: DC
  Administered 2018-04-30: 400 mg via ORAL

## 2018-04-30 MED ORDER — APIXABAN 5 MG PO TABS: 5.0000 mg | ORAL_TABLET | Freq: Two times a day (BID) | ORAL | Status: DC

## 2018-04-30 MED ORDER — AMIODARONE HCL 400 MG PO TABS: 200.0000 mg | ORAL_TABLET | Freq: Every day | ORAL | Status: DC

## 2018-04-30 MED ORDER — AMIODARONE HCL 200 MG PO TABS: 200.0000 mg | ORAL_TABLET | Freq: Every day | ORAL | Status: DC

## 2018-04-30 MED ORDER — DILTIAZEM HCL ER COATED BEADS 240 MG PO CP24: 240.0000 mg | ORAL_CAPSULE | Freq: Every day | ORAL | Status: DC

## 2018-04-30 NOTE — Progress Notes (Signed)
[facility]  ROUNDING NOTE   Subjective:   Patient states she still feels weak and endorses shortness of breath.   Furosemide 20mg  bid PO   UOP 1051mL.   Objective:  Vital signs in last 24 hours:  Temp:  [97.8 F (36.6 C)-98.1 F (36.7 C)] 98 F (36.7 C) (12/31 0755) Pulse Rate:  [80-90] 86 (12/31 0755) Resp:  [16-20] 18 (12/31 0755) BP: (110-132)/(65-79) 132/79 (12/31 0755) SpO2:  [92 %-96 %] 92 % (12/31 0755) Weight:  [97.5 kg] 97.5 kg (12/31 0516)  Weight change: -1.769 kg Filed Weights   04/28/18 0404 04/29/18 0436 04/30/18 0516  Weight: 99.5 kg 99.2 kg 97.5 kg    Intake/Output: I/O last 3 completed shifts: In: 360 [P.O.:360] Out: 1700 [Urine:1700]   Intake/Output this shift:  No intake/output data recorded.  Physical Exam: General: No acute distress  Head: +ecchymosis  Eyes: Anicteric  Neck: Supple, trachea midline  Lungs:  Clear, 2 L [place]  Heart: regular  Abdomen:  Soft, nontender, bowel sounds present  Extremities: No peripheral edema.  Neurologic: Awake, alert, following commands  Skin: +ecchymoses        Basic Metabolic Panel: Recent Labs  Lab 04/24/18 0601 04/25/18 0542 04/26/18 0538 04/27/18 0320 04/30/18 0307  NA 138 137 138 138  --   K 4.2 3.9 3.4* 3.7  --   CL 107 103 104 103  --   CO2 24 25 25 26   --   GLUCOSE 127* 123* 171* 122*  --   BUN 30* 27* 32* 35*  --   CREATININE 1.56* 1.36* 1.43* 1.43* 1.63*  CALCIUM 8.1* 8.4* 8.4* 8.3*  --   MG 2.0  --  2.1  --   --   PHOS 3.1  --   --   --   --     Liver Function Tests: No results for input(s): AST, ALT, ALKPHOS, BILITOT, PROT, ALBUMIN in the last 168 hours. No results for input(s): LIPASE, AMYLASE in the last 168 hours. No results for input(s): AMMONIA in the last 168 hours.  CBC: Recent Labs  Lab 04/27/18 0320 04/28/18 0308 04/30/18 0307  WBC 10.1 8.1 6.2  HGB 10.1* 9.5* 9.5*  HCT 32.7* 31.1* 29.1*  MCV 91.9 92.6 92.1  PLT 258 253 276    Cardiac  Enzymes: No results for input(s): CKTOTAL, CKMB, CKMBINDEX, TROPONINI in the last 168 hours.  BNP: Invalid input(s): POCBNP  CBG: No results for input(s): GLUCAP in the last 168 hours.  Microbiology: Results for orders placed or performed during the hospital encounter of 04/18/18  MRSA PCR Screening     Status: None   Collection Time: 04/19/18  9:09 AM  Result Value Ref Range Status   MRSA by PCR NEGATIVE NEGATIVE Final    Comment:        The GeneXpert MRSA Assay (FDA approved for NASAL specimens only), is one component of a comprehensive MRSA colonization surveillance program. It is not intended to diagnose MRSA infection nor to guide or monitor treatment for MRSA infections. Performed at [facility], [address] [place], [place] [address]     Coagulation Studies: No results for input(s): LABPROT, INR in the last 72 hours.  Urinalysis: No results for input(s): COLORURINE, LABSPEC, PHURINE, GLUCOSEU, HGBUR, BILIRUBINUR, KETONESUR, PROTEINUR, UROBILINOGEN, NITRITE, LEUKOCYTESUR in the last 72 hours.  Invalid input(s): APPERANCEUR    Imaging: No results found.   Medications:    . acetaminophen  650 mg Oral TID  . amiodarone  400 mg Oral Daily  . apixaban  5 mg Oral BID  . aspirin EC  81 mg Oral Daily  . budesonide (PULMICORT) nebulizer solution  0.5 mg Nebulization BID  . cholecalciferol  1,000 Units Oral Daily  . diltiazem  240 mg Oral Daily  . docusate sodium  100 mg Oral BID  . feeding supplement (ENSURE ENLIVE)  237 mL Oral BID BM  . ferrous sulfate  325 mg Oral Daily  . furosemide  20 mg Oral BID  . ipratropium-albuterol  3 mL Nebulization BID  . levofloxacin  250 mg Oral Daily  . lidocaine  1 patch Transdermal Q24H  . mouth rinse  15 mL Mouth Rinse BID  . metoprolol  100 mg Oral BID  . multivitamin with minerals  1 tablet Oral Daily  . pravastatin  40 mg Oral QODAY  . sodium chloride flush  10 mL Intravenous Q12H  . traZODone  100 mg  Oral QHS  . cyanocobalamin  1,000 mcg Oral Daily   cyclobenzaprine, hydrALAZINE, Melatonin, ondansetron **OR** ondansetron (ZOFRAN) IV, oxyCODONE-acetaminophen, polyethylene glycol, simethicone  Assessment/ Plan:  84 y.o. female with hypertension, hyperlipidemia, GERD, breast cancer status post right mastectomy, cataracts, asthma, hemorrhoids, diabetes mellitus type 2, anemia of CKD.   1.  Acute renal failure/CKD stage IV baseline Cr 1.76. GFR of 25.  2.  Rhabdomyolysis s/p prolonged fall. 3.  Hypertension. 4.  Anemia of CKD. Hemoglobin 9.5  Plan:  Continue to encourage PO intake.  Continue furosemide.    LOS: 12 Destiny Oliver 12/31/201910:33 AM

## 2018-04-30 NOTE — Discharge Summary (Signed)
[place] at [place] NAME: Destiny Oliver    MR#:  [id number]  DATE OF BIRTH:  1927/04/05  DATE OF ADMISSION:  04/18/2018   ADMITTING PHYSICIAN: Gladstone Lighter, MD  DATE OF DISCHARGE: 04/30/2018  PRIMARY CARE PHYSICIAN: Juluis Pitch, MD   ADMISSION DIAGNOSIS:  Dehydration [E86.0] Fall [W19.XXXA] Contusion of face, initial encounter [S00.83XA] Fall, initial encounter [W19.XXXA] Atrial fibrillation, unspecified type ([place]) [I48.91] A-fib ([place]) [I48.91] DISCHARGE DIAGNOSIS:  Active Problems:   A-fib ([place])   Rhabdomyolysis  SECONDARY DIAGNOSIS:   Past Medical History:  Diagnosis Date  . Breast cancer ([place]) 1994   right breast  . CKD (chronic kidney disease)   . Hypertension   . Tachycardia    HOSPITAL COURSE:  84 year old female with a history of breast cancer status post right breast mastectomy and hypertension who presented to the hospital after a fall.  *Acute rhabdomyolysis due to fall Resolvedwith IV fluids for rehydration  * Acute renal failure/CKD stage IV baseline Cr 1.76. Likely secondary to above as well as dehydration Discontinuedhome dose oflisinopril/hydrochlorothiazide,treated with IV fluids for rehydration,avoidednephrotoxic agents. Renal ultrasound was unimpressive. Improved better than baseline.  *Acute diastolic congestive heart failure exacerbation Secondary to numerous days of IV fluids for rehydration given rhabdomyolysis/acute kidney injury Echocardiogram with normal ejection fraction Continue Eliquis,aspirin  Continue Lasix to 20 mg p.o.daily.  Follow-up with Dr. Nehemiah Massed as outpatient.  *Acute hypoxic respiratory failure Secondary to above as well as deconditioning most recent chest x-ray suspicious for infiltrate though no signs clinically, normal procalcitonin level. CT chest: Patchy airspace opacities in the upper lobes, right greater than left as well as superior segment of right  lower lobe concerning for multilobar pneumonia and atelectasis. She is treated with Levaquin. Weaned O2 down to 2 L [place], she needs home O2.   *Acute elevated troponin Secondary to demand ischemia Patient has ruled out for ACS  *Atrial fibrillation with RVR Rate controlled withamiodarone, Eliquis, diltiazem, and beta-blocker therapy  *Essential hypertension Stableon current regiment  *Acute insomnia Trazodone at bedtime  *Acute back pain,  Most likely secondary to osteoarthritis. Tylenol prn.improved. . PT reevaluation suggest skilled nursing facility placement.  DISCHARGE CONDITIONS:  Stable, discharge to nursing facility today. CONSULTS OBTAINED:  Treatment Team:  Anthonette Legato, MD DRUG ALLERGIES:   Allergies  Allergen Reactions  . Penicillins Swelling   DISCHARGE MEDICATIONS:   Allergies as of 04/30/2018      Reactions   Penicillins Swelling      Medication List    STOP taking these medications   lisinopril-hydrochlorothiazide 20-12.5 MG tablet Commonly known as:  PRINZIDE,ZESTORETIC   verapamil 240 MG CR tablet Commonly known as:  CALAN-SR     TAKE these medications   amiodarone 200 MG tablet Commonly known as:  PACERONE Take 1 tablet (200 mg total) by mouth daily.   apixaban 2.5 MG Tabs tablet Commonly known as:  ELIQUIS Take 1 tablet (2.5 mg total) by mouth 2 (two) times daily.   aspirin EC 81 MG tablet Take 81 mg by mouth daily.   budesonide 0.5 MG/2ML nebulizer solution Commonly known as:  PULMICORT Take 2 mLs (0.5 mg total) by nebulization 2 (two) times daily.   cholecalciferol 25 MCG (1000 UT) tablet Commonly known as:  VITAMIN D3 Take 1,000 Units by mouth daily.   [employer] VITAMIN B12 2000 MCG tablet Generic drug:  cyanocobalamin Take 1,000 mcg by mouth daily.   diltiazem 240 MG 24 hr capsule Commonly known as:  CARDIZEM CD Take 1 capsule (240 mg total) by mouth daily.   feeding supplement (ENSURE ENLIVE) Liqd Take 237 mLs by  mouth 2 (two) times daily between meals.   ferrous sulfate 325 (65 FE) MG tablet Take 325 mg by mouth daily.   furosemide 20 MG tablet Commonly known as:  LASIX Take 1 tablet (20 mg total) by mouth every morning.   HAIR SKIN AND NAILS FORMULA Tabs Take 1 tablet by mouth daily.   hydrALAZINE 50 MG tablet Commonly known as:  APRESOLINE Take 1 tablet (50 mg total) by mouth every 8 (eight) hours. What changed:    how much to take  when to take this   ipratropium-albuterol 0.5-2.5 (3) MG/3ML Soln Commonly known as:  DUONEB Take 3 mLs by nebulization every 4 (four) hours as needed (SOB, wheezing).   metoprolol 200 MG 24 hr tablet Commonly known as:  TOPROL-XL Take 100 mg by mouth 2 (two) times daily.   potassium chloride 10 MEQ tablet Commonly known as:  K-DUR Take 1 tablet (10 mEq total) by mouth every morning.   pravastatin 40 MG tablet Commonly known as:  PRAVACHOL Take 40 mg by mouth every other day.   traZODone 100 MG tablet Commonly known as:  DESYREL Take 1 tablet (100 mg total) by mouth at bedtime as needed for sleep.            Durable Medical Equipment  (From admission, onward)         Start     Ordered   04/26/18 1345  For home use only DME oxygen  Once    Question Answer Comment  Mode or (Route) Nasal cannula   Liters per Minute 2   Frequency Continuous (stationary and portable oxygen unit needed)   Oxygen conserving device Yes   Oxygen delivery system Gas      04/26/18 1344           DISCHARGE INSTRUCTIONS:  See AVS.  If you experience worsening of your admission symptoms, develop shortness of breath, life threatening emergency, suicidal or homicidal thoughts you must seek medical attention immediately by calling 911 or calling your MD immediately  if symptoms less severe.  You Must read complete instructions/literature along with all the possible adverse reactions/side effects for all the Medicines you take and that have been prescribed  to you. Take any new Medicines after you have completely understood and accpet all the possible adverse reactions/side effects.   Please note  You were cared for by a hospitalist during your hospital stay. If you have any questions about your discharge medications or the care you received while you were in the hospital after you are discharged, you can call the unit and asked to speak with the hospitalist on call if the hospitalist that took care of you is not available. Once you are discharged, your primary care physician will handle any further medical issues. Please note that NO REFILLS for any discharge medications will be authorized once you are discharged, as it is imperative that you return to your primary care physician (or establish a relationship with a primary care physician if you do not have one) for your aftercare needs so that they can reassess your need for medications and monitor your lab values.    On the day of Discharge:  VITAL SIGNS:  Blood pressure 132/79, pulse 86, temperature 98 F (36.7 C), temperature source Oral, resp. rate 18, height 5\' 1"  (1.549 m), weight 97.5 kg, SpO2 92 %.  PHYSICAL EXAMINATION:  GENERAL:  84 y.o.-year-old patient lying in the bed with no acute distress.  EYES: Pupils equal, round, reactive to light and accommodation. No scleral icterus. Extraocular muscles intact.  HEENT: Head atraumatic, normocephalic. Oropharynx and nasopharynx clear.  NECK:  Supple, no jugular venous distention. No thyroid enlargement, no tenderness.  LUNGS: Normal breath sounds bilaterally, no wheezing, rales,rhonchi or crepitation. No use of accessory muscles of respiration.  CARDIOVASCULAR: S1, S2 normal. No murmurs, rubs, or gallops.  ABDOMEN: Soft, non-tender, non-distended. Bowel sounds present. No organomegaly or mass.  EXTREMITIES: No pedal edema, cyanosis, or clubbing.  NEUROLOGIC: Cranial nerves II through XII are intact. Muscle strength 4/5 in all extremities.  Sensation intact. Gait not checked.  PSYCHIATRIC: The patient is alert and oriented x 3.  SKIN: No obvious rash, lesion, or ulcer.  DATA REVIEW:   CBC Recent Labs  Lab 04/30/18 0307  WBC 6.2  HGB 9.5*  HCT 29.1*  PLT 276    Chemistries  Recent Labs  Lab 04/26/18 0538 04/27/18 0320 04/30/18 0307  NA 138 138  --   K 3.4* 3.7  --   CL 104 103  --   CO2 25 26  --   GLUCOSE 171* 122*  --   BUN 32* 35*  --   CREATININE 1.43* 1.43* 1.63*  CALCIUM 8.4* 8.3*  --   MG 2.1  --   --      Microbiology Results  Results for orders placed or performed during the hospital encounter of 04/18/18  MRSA PCR Screening     Status: None   Collection Time: 04/19/18  9:09 AM  Result Value Ref Range Status   MRSA by PCR NEGATIVE NEGATIVE Final    Comment:        The GeneXpert MRSA Assay (FDA approved for NASAL specimens only), is one component of a comprehensive MRSA colonization surveillance program. It is not intended to diagnose MRSA infection nor to guide or monitor treatment for MRSA infections. Performed at [facility], [address] [place], [place] [address]     RADIOLOGY:  No results found.   Management plans discussed with the patient, family and they are in agreement.  CODE STATUS: DNR   TOTAL TIME TAKING CARE OF THIS PATIENT: 35 minutes.    Demetrios Loll M.D on 04/30/2018 at 8:57 AM  Between 7am to 6pm - Pager - [phone]  After 6pm go to [email or web address] - Proofreader  [facility]  Office  [phone]  CC: Primary care physician; Juluis Pitch, MD   Note: This dictation was prepared with Dragon dictation along with smaller phrase technology. Any transcriptional errors that result from this process are unintentional.

## 2018-04-30 NOTE — Clinical Social Work Note (Signed)
Patient to be d/c'ed today to [facility] room 203B.  Patient and family agreeable to plans will transport via ems RN to call report to [phone].  CSW updated Reginold Agent patient's HCPOA [phone] who is aware that patient is discharging today.  Evette Cristal, MSW, [place]

## 2018-04-30 NOTE — Progress Notes (Signed)
IV and tele removed from patient. Report called to [facility]. Patient prepared for transport, EMS called for transportation. Patient currently awaiting. No acute distress at this time.

## 2018-05-02 ENCOUNTER — Non-Acute Institutional Stay (SKILLED_NURSING_FACILITY): Payer: Medicare Other | Admitting: Adult Health

## 2018-05-02 ENCOUNTER — Encounter: Payer: Self-pay | Admitting: Adult Health

## 2018-05-02 ENCOUNTER — Encounter
Admission: RE | Admit: 2018-05-02 | Discharge: 2018-05-02 | Disposition: A | Discharge status: Home / Self Care | Payer: Medicare Other | Source: Ambulatory Visit | Attending: Internal Medicine | Admitting: Internal Medicine

## 2018-05-02 DIAGNOSIS — I5033 Acute on chronic diastolic (congestive) heart failure: Secondary | ICD-10-CM | POA: Insufficient documentation

## 2018-05-02 DIAGNOSIS — I129 Hypertensive chronic kidney disease with stage 1 through stage 4 chronic kidney disease, or unspecified chronic kidney disease: Secondary | ICD-10-CM

## 2018-05-02 DIAGNOSIS — E1169 Type 2 diabetes mellitus with other specified complication: Secondary | ICD-10-CM

## 2018-05-02 DIAGNOSIS — I5032 Chronic diastolic (congestive) heart failure: Secondary | ICD-10-CM

## 2018-05-02 DIAGNOSIS — I130 Hypertensive heart and chronic kidney disease with heart failure and stage 1 through stage 4 chronic kidney disease, or unspecified chronic kidney disease: Secondary | ICD-10-CM | POA: Insufficient documentation

## 2018-05-02 DIAGNOSIS — E1122 Type 2 diabetes mellitus with diabetic chronic kidney disease: Secondary | ICD-10-CM | POA: Insufficient documentation

## 2018-05-02 DIAGNOSIS — E785 Hyperlipidemia, unspecified: Secondary | ICD-10-CM

## 2018-05-02 DIAGNOSIS — N184 Chronic kidney disease, stage 4 (severe): Secondary | ICD-10-CM | POA: Insufficient documentation

## 2018-05-02 DIAGNOSIS — I480 Paroxysmal atrial fibrillation: Secondary | ICD-10-CM

## 2018-05-02 DIAGNOSIS — T796XXS Traumatic ischemia of muscle, sequela: Secondary | ICD-10-CM

## 2018-05-02 DIAGNOSIS — D631 Anemia in chronic kidney disease: Secondary | ICD-10-CM | POA: Insufficient documentation

## 2018-05-02 DIAGNOSIS — J9621 Acute and chronic respiratory failure with hypoxia: Secondary | ICD-10-CM | POA: Insufficient documentation

## 2018-05-02 NOTE — Progress Notes (Signed)
[facility]   [facility] Room Number: [place] of Service:  SNF (31)   CODE STATUS: DNR  Allergies  Allergen Reactions  . Penicillins Swelling    Chief Complaint  Patient presents with  . Hospitalization Follow-up    Hospital Follow up    HPI:  She is a 85 year old woman who suffered a fall at home. She has recently lost her spouse at the end of November. She was found on the floor and sent to the ED. She was hospitalized from 04-18-18 through 04-30-18 for rhabdomyolysis; acute on chronic renal failure; acute diastolic heart failure; acute hypoxic respiratory failure. She was treated with IVF; levaquin. She is here for short term rehab with her goal to return back home. She tells me that she was not on a fluid restriction at home and was not on 02 at home. She is having fatigue; lower extremity edema; denies any chest pain; no uncontrolled pain. She will continue to be followed for her chronic illnesses including: chf; afib; anemia.    Past Medical History:  Diagnosis Date  . Breast cancer ([place]) 1994   right breast  . CKD (chronic kidney disease)   . Hypertension   . Tachycardia     Past Surgical History:  Procedure Laterality Date  . MASTECTOMY Right 1994    Social History   Socioeconomic History  . Marital status: Married    Spouse name: Not on file  . Number of children: Not on file  . Years of education: Not on file  . Highest education level: Not on file  Occupational History  . Not on file  Social Needs  . Financial resource strain: Not on file  . Food insecurity:    Worry: Not on file    Inability: Not on file  . Transportation needs:    Medical: Not on file    Non-medical: Not on file  Tobacco Use  . Smoking status: Never Smoker  . Smokeless tobacco: Never Used  Substance and Sexual Activity  . Alcohol use: Never    Frequency: Never  . Drug use: Never  . Sexual activity: Not on file  Lifestyle  . Physical activity:      Days per week: Not on file    Minutes per session: Not on file  . Stress: Not on file  Relationships  . Social connections:    Talks on phone: Not on file    Gets together: Not on file    Attends religious service: Not on file    Active member of club or organization: Not on file    Attends meetings of clubs or organizations: Not on file    Relationship status: Not on file  . Intimate partner violence:    Fear of current or ex partner: Not on file    Emotionally abused: Not on file    Physically abused: Not on file    Forced sexual activity: Not on file  Other Topics Concern  . Not on file  Social History Narrative  . Not on file   History reviewed. No pertinent family history.    VITAL SIGNS BP (!) 133/98   Pulse 89   Temp 98.3 F (36.8 C)   Resp 16   Ht 5\' 1"  (1.549 m)   Wt 215 lb 4.8 oz (97.7 kg)   SpO2 95%   BMI 40.68 kg/m   Outpatient Encounter Medications as of 05/02/2018  Medication Sig  . amiodarone (  PACERONE) 200 MG tablet Take 1 tablet (200 mg total) by mouth daily.  Marland Kitchen apixaban (ELIQUIS) 2.5 MG TABS tablet Take 1 tablet (2.5 mg total) by mouth 2 (two) times daily.  Marland Kitchen aspirin EC 81 MG tablet Take 81 mg by mouth daily.  . budesonide (PULMICORT) 0.5 MG/2ML nebulizer solution Take 2 mLs (0.5 mg total) by nebulization 2 (two) times daily.  . cholecalciferol (VITAMIN D3) 25 MCG (1000 UT) tablet Take 1,000 Units by mouth daily.  . cyanocobalamin ([employer] VITAMIN B12) 2000 MCG tablet Take 1,000 mcg by mouth daily.  Marland Kitchen diltiazem (CARDIZEM CD) 240 MG 24 hr capsule Take 1 capsule (240 mg total) by mouth daily.  . feeding supplement, ENSURE ENLIVE, (ENSURE ENLIVE) LIQD Take 237 mLs by mouth 2 (two) times daily between meals.  . ferrous sulfate 325 (65 FE) MG tablet Take 325 mg by mouth daily.  . furosemide (LASIX) 20 MG tablet Take 1 tablet (20 mg total) by mouth every morning.  . hydrALAZINE (APRESOLINE) 50 MG tablet Take 1 tablet (50 mg total) by mouth every 8 (eight)  hours.  Marland Kitchen ipratropium-albuterol (DUONEB) 0.5-2.5 (3) MG/3ML SOLN Take 3 mLs by nebulization every 4 (four) hours as needed (SOB, wheezing).  . metoprolol (TOPROL-XL) 200 MG 24 hr tablet Take 100 mg by mouth 2 (two) times daily.  . Multiple Vitamins-Minerals (HAIR SKIN AND NAILS FORMULA) TABS Take 1 tablet by mouth daily.  . NON FORMULARY Fluid Restriction - 1500 ml / day  Diet Type - NAS  . OXYGEN Inhale 2 L/min into the lungs continuous.  . potassium chloride (K-DUR) 10 MEQ tablet Take 1 tablet (10 mEq total) by mouth every morning.  . pravastatin (PRAVACHOL) 40 MG tablet Take 40 mg by mouth every other day.  . traZODone (DESYREL) 100 MG tablet Take 1 tablet (100 mg total) by mouth at bedtime as needed for sleep.   No facility-administered encounter medications on file as of 05/02/2018.      SIGNIFICANT DIAGNOSTIC EXAMS  TODAY;   04-18-18: ct of head/ct maxillofacial:  1. No acute intracranial abnormality 2. Negative for facial fracture.   04-19-18: 2-d echo: Left ventricle: The cavity size was normal. There was moderate oncentric hypertrophy. Systolic function was normal. The estimated ejection fraction was in the range of 55% to 60%.  04-21-18: renal ultrasound: Bilateral renal cysts. No significant or acute finding by ultrasound.  04-26-18: chest x-ray:  1. Cardiomegaly and bilateral effusions may indicate mild CHF. 2. However patchy opacities bilaterally particularly in the right upper lobe are suspicious for superimposed pneumonia and follow-up two-view chest x-ray is recommended.  04-27-18: ct of chest:  1. Small left greater than right pleural effusions with adjacent atelectasis. 2. Patchy airspace opacities in the upper lobes, right greater than left as well as superior segment of right lower lobe concerning for multilobar pneumonia and atelectasis. Followup PA and lateral chest X-ray is recommended in 3-4 weeks following trial of antibiotic therapy to ensure resolution  and exclude underlying malignancy. 3. Mild reactive mediastinal lymphadenopathy. Aortic Atherosclerosis   LABS REVIEWED TODAY:   04-18-18: wbc 11.4; hgb 15.1 hct; 47.1; mcv 88.; plt 280; glucose 129; bun 38; creat 1.21; k+ 3.2; na++ 142; ca 9.6; ast 119; alt 77; total bili 1.4; albumin 4.0 mag 2.0  CK 1767  tsh 1.173  04-20-18: wbc 9.5; hgb 12.2; hct 38.9; mcv 90.5; plt 219; glucose 141; bun 35; creat 1.12 k+ 4.0; na++ 137; ca 8.4; mag 2.4 phos 2.2  CK 307 04-25-18:  BNP 1222.0 04-27-18: wbc 10.1; hgb 10.1; hct 32.7; mcv 91.9; plt 258; glucose 122; bun 35; creat 1.43; k+ 3.7; na++ 138; ca 8.3  21-31-19: wbc 6.2; hgb 9.5; hct 29.1; mcv 92.1; plt 276   Review of Systems  Constitutional: Positive for malaise/fatigue.  Respiratory: Negative for cough and shortness of breath.   Cardiovascular: Positive for leg swelling. Negative for chest pain and palpitations.  Gastrointestinal: Negative for abdominal pain, constipation and heartburn.  Musculoskeletal: Negative for back pain, joint pain and myalgias.  Skin: Negative.   Neurological: Positive for weakness. Negative for dizziness.  Psychiatric/Behavioral: The patient is not nervous/anxious.     Physical Exam Constitutional:      General: She is not in acute distress.    Appearance: She is well-developed. She is obese. She is not diaphoretic.  Neck:     Musculoskeletal: Neck supple.     Thyroid: No thyromegaly.  Cardiovascular:     Rate and Rhythm: Normal rate. Rhythm irregular.     Pulses: Normal pulses.     Heart sounds: Murmur present.     Comments: 1/6 Pulmonary:     Effort: Pulmonary effort is normal. No respiratory distress.     Breath sounds: Normal breath sounds.     Comments: 02 dependent (did not use at home)  Abdominal:     General: Bowel sounds are normal. There is no distension.     Palpations: Abdomen is soft.     Tenderness: There is no abdominal tenderness.  Musculoskeletal:     Right lower leg: Edema present.      Left lower leg: Edema present.     Comments: Is able to move all extremities Has 3-4+ bilateral lower extremity edema  History of bilateral knee replacements   Lymphadenopathy:     Cervical: No cervical adenopathy.  Skin:    General: Skin is warm and dry.     Coloration: Skin is not pale.     Comments: Status post right mastectomy 1994  Neurological:     Mental Status: She is alert and oriented to person, place, and time.     Cranial Nerves: No cranial nerve deficit.  Psychiatric:        Mood and Affect: Mood normal.        ASSESSMENT/ PLAN:  TODAY:   1.  Type 2 diabetes mellitus stage 4 chronic kidney disease and hypertension: is stable hgb a1c 5.7. will not make changes and will monitor  2. Stage 4 chronic renal impairment associated with type 2 diabetes mellitus: is stable bun 35; creat 1.43  3. Hypertensive heart and kidney disease with chronic diastolic congestive failure and stage 4 chronic kidney disease: is stable b/p 133/98: will continue toprol xl 100 mg twice daily and apresoline 50 mg every 8 hours  4. Acute on chronic diastolic heart failure: is stable EF 55-60% (04-19-18); will continue toprol xl 100 mg twice daily apresoline 50 mg every 8 hours will increase lasix to 40 mg daily and will continue k+ 10 meq daily  Is on 1500 cc fluid restriction   5. Paroxysmal afib heart rate is stable: will continue toprol xl 100 mg twice daily; cardizem cd 240 mg daily and amiodarone 200 mg daily for rate control will continue eliquis 2.5 mg twice daily and asa 81 mg daily   6. Dyslipidemia associated with type 2 diabetes mellitus: is stable will continue pravachol 40 mg daily   7. Anemia associated with stage 4 chronic renal failure: is stable hgb  9.5; will continue iron daily   8. Acute on chronic respiratory failure with hypoxia: is without change will continue 02 at 2L pulmicort neb twice daily duoneb every 4 hours as needed  9. Pneumonia: has completed abt and will  monitor her status.   10. Rhabdomyolysis: CK is resolving liver function is improving will monitor    Will check CMP on 05-06-18.     MD is aware of resident's narcotic use and is in agreement with current plan of care. We will attempt to wean resident as apropriate   Ok Edwards NP [facility]  Contact [phone] Monday through Friday 8am- 5pm  After hours call [phone]

## 2018-05-06 ENCOUNTER — Other Ambulatory Visit
Admission: RE | Admit: 2018-05-06 | Discharge: 2018-05-06 | Disposition: A | Discharge status: Home / Self Care | Payer: Medicare Other | Source: Skilled Nursing Facility | Attending: Internal Medicine | Admitting: Internal Medicine

## 2018-05-06 ENCOUNTER — Ambulatory Visit
Admission: RE | Admit: 2018-05-06 | Discharge: 2018-05-06 | Disposition: A | Discharge status: Home / Self Care | Payer: Medicare Other | Source: Ambulatory Visit | Attending: Internal Medicine | Admitting: Internal Medicine

## 2018-05-06 ENCOUNTER — Other Ambulatory Visit: Payer: Self-pay | Admitting: Internal Medicine

## 2018-05-06 ENCOUNTER — Non-Acute Institutional Stay (SKILLED_NURSING_FACILITY): Payer: Medicare Other | Admitting: Adult Health

## 2018-05-06 ENCOUNTER — Encounter: Payer: Self-pay | Admitting: Adult Health

## 2018-05-06 DIAGNOSIS — H532 Diplopia: Secondary | ICD-10-CM | POA: Diagnosis present

## 2018-05-06 DIAGNOSIS — R42 Dizziness and giddiness: Secondary | ICD-10-CM

## 2018-05-06 LAB — BASIC METABOLIC PANEL
Anion gap: 7 (ref 5–15)
BUN: 26 mg/dL — ABNORMAL HIGH (ref 8–23)
CO2: 29 mmol/L (ref 22–32)
Calcium: 8.5 mg/dL — ABNORMAL LOW (ref 8.9–10.3)
Chloride: 108 mmol/L (ref 98–111)
Creatinine, Ser: 1.63 mg/dL — ABNORMAL HIGH (ref 0.44–1.00)
GFR calc Af Amer: 32 mL/min — ABNORMAL LOW (ref >60)
GFR calc non Af Amer: 28 mL/min — ABNORMAL LOW (ref >60)
Glucose, Bld: 108 mg/dL — ABNORMAL HIGH (ref 70–99)
Potassium: 3.8 mmol/L (ref 3.5–5.1)
Sodium: 144 mmol/L (ref 135–145)

## 2018-05-06 NOTE — Progress Notes (Signed)
[facility]:   [facility] Room Number: [place] of Service:  SNF (31)   CODE STATUS: DNR  Allergies  Allergen Reactions  . Penicillins Swelling    Chief Complaint  Patient presents with  . Acute Visit    Blurred Vision    HPI:  She has started to have vertigo and double vision this AM. She feels weaker today than yesterday. Her vital signs are stable. She is not diaphoretic; no altered state of consciousness. She is concerned that she is having a stroke.   Past Medical History:  Diagnosis Date  . Breast cancer ([place]) 1994   right breast  . CKD (chronic kidney disease)   . Hypertension   . Tachycardia     Past Surgical History:  Procedure Laterality Date  . MASTECTOMY Right 1994    Social History   Socioeconomic History  . Marital status: Married    Spouse name: Not on file  . Number of children: Not on file  . Years of education: Not on file  . Highest education level: Not on file  Occupational History  . Not on file  Social Needs  . Financial resource strain: Not on file  . Food insecurity:    Worry: Not on file    Inability: Not on file  . Transportation needs:    Medical: Not on file    Non-medical: Not on file  Tobacco Use  . Smoking status: Never Smoker  . Smokeless tobacco: Never Used  Substance and Sexual Activity  . Alcohol use: Never    Frequency: Never  . Drug use: Never  . Sexual activity: Not on file  Lifestyle  . Physical activity:    Days per week: Not on file    Minutes per session: Not on file  . Stress: Not on file  Relationships  . Social connections:    Talks on phone: Not on file    Gets together: Not on file    Attends religious service: Not on file    Active member of club or organization: Not on file    Attends meetings of clubs or organizations: Not on file    Relationship status: Not on file  . Intimate partner violence:    Fear of current or ex partner: Not on file    Emotionally abused:  Not on file    Physically abused: Not on file    Forced sexual activity: Not on file  Other Topics Concern  . Not on file  Social History Narrative  . Not on file   History reviewed. No pertinent family history.    VITAL SIGNS BP (!) 136/91   Pulse (!) 102   Temp 98 F (36.7 C)   Resp 18   Ht 5\' 1"  (1.549 m)   Wt 208 lb 6.4 oz (94.5 kg)   SpO2 100%   BMI 39.38 kg/m   Outpatient Encounter Medications as of 05/06/2018  Medication Sig  . amiodarone (PACERONE) 200 MG tablet Take 1 tablet (200 mg total) by mouth daily.  Marland Kitchen apixaban (ELIQUIS) 2.5 MG TABS tablet Take 1 tablet (2.5 mg total) by mouth 2 (two) times daily.  Marland Kitchen aspirin EC 81 MG tablet Take 81 mg by mouth daily.  . budesonide (PULMICORT) 0.5 MG/2ML nebulizer solution Take 2 mLs (0.5 mg total) by nebulization 2 (two) times daily.  . cholecalciferol (VITAMIN D3) 25 MCG (1000 UT) tablet Take 1,000 Units by mouth daily.  . cyanocobalamin ([employer]S VITAMIN  B12) 2000 MCG tablet Take 1,000 mcg by mouth daily.  Marland Kitchen diltiazem (CARDIZEM CD) 240 MG 24 hr capsule Take 1 capsule (240 mg total) by mouth daily.  . ferrous sulfate 325 (65 FE) MG tablet Take 325 mg by mouth daily.  Marland Kitchen ipratropium-albuterol (DUONEB) 0.5-2.5 (3) MG/3ML SOLN Take 3 mLs by nebulization every 4 (four) hours as needed (SOB, wheezing).  . metoprolol (TOPROL-XL) 200 MG 24 hr tablet Take 100 mg by mouth 2 (two) times daily.  . Multiple Vitamins-Minerals (HAIR SKIN AND NAILS FORMULA) TABS Take 1 tablet by mouth daily.  . NON FORMULARY Fluid Restriction - 1500 ml / day  Diet Type - NAS  . OXYGEN Inhale 2 L/min into the lungs continuous.  . potassium chloride (K-DUR) 10 MEQ tablet Take 1 tablet (10 mEq total) by mouth every morning.  . pravastatin (PRAVACHOL) 40 MG tablet Take 40 mg by mouth every other day.  . torsemide (DEMADEX) 20 MG tablet Take 20 mg by mouth daily.  . traZODone (DESYREL) 100 MG tablet Take 1 tablet (100 mg total) by mouth at bedtime as needed for sleep.   . [DISCONTINUED] feeding supplement, ENSURE ENLIVE, (ENSURE ENLIVE) LIQD Take 237 mLs by mouth 2 (two) times daily between meals. (Patient not taking: Reported on 05/06/2018)  . [DISCONTINUED] furosemide (LASIX) 20 MG tablet Take 1 tablet (20 mg total) by mouth every morning. (Patient not taking: Reported on 05/06/2018)  . [DISCONTINUED] hydrALAZINE (APRESOLINE) 50 MG tablet Take 1 tablet (50 mg total) by mouth every 8 (eight) hours. (Patient not taking: Reported on 05/06/2018)   No facility-administered encounter medications on file as of 05/06/2018.      SIGNIFICANT DIAGNOSTIC EXAMS  PREVIOUS;   04-18-18: ct of head/ct maxillofacial:  1. No acute intracranial abnormality 2. Negative for facial fracture.   04-19-18: 2-d echo: Left ventricle: The cavity size was normal. There was moderate oncentric hypertrophy. Systolic function was normal. The estimated ejection fraction was in the range of 55% to 60%.  04-21-18: renal ultrasound: Bilateral renal cysts. No significant or acute finding by ultrasound.  04-26-18: chest x-ray:  1. Cardiomegaly and bilateral effusions may indicate mild CHF. 2. However patchy opacities bilaterally particularly in the right upper lobe are suspicious for superimposed pneumonia and follow-up two-view chest x-ray is recommended.  04-27-18: ct of chest:  1. Small left greater than right pleural effusions with adjacent atelectasis. 2. Patchy airspace opacities in the upper lobes, right greater than left as well as superior segment of right lower lobe concerning for multilobar pneumonia and atelectasis. Followup PA and lateral chest X-ray is recommended in 3-4 weeks following trial of antibiotic therapy to ensure resolution and exclude underlying malignancy. 3. Mild reactive mediastinal lymphadenopathy. Aortic Atherosclerosis   NO NEW EXAMS.     LABS REVIEWED PREVIOUS:   04-18-18: wbc 11.4; hgb 15.1 hct; 47.1; mcv 88.; plt 280; glucose 129; bun 38; creat 1.21;  k+ 3.2; na++ 142; ca 9.6; ast 119; alt 77; total bili 1.4; albumin 4.0 mag 2.0  CK 1767  tsh 1.173  04-20-18: wbc 9.5; hgb 12.2; hct 38.9; mcv 90.5; plt 219; glucose 141; bun 35; creat 1.12 k+ 4.0; na++ 137; ca 8.4; mag 2.4 phos 2.2  CK 307 04-25-18: BNP 1222.0 04-27-18: wbc 10.1; hgb 10.1; hct 32.7; mcv 91.9; plt 258; glucose 122; bun 35; creat 1.43; k+ 3.7; na++ 138; ca 8.3  21-31-19: wbc 6.2; hgb 9.5; hct 29.1; mcv 92.1; plt 276   NO NEW LABS.    Review  of Systems  Constitutional: Negative for malaise/fatigue.  Eyes: Positive for double vision.  Respiratory: Negative for cough and shortness of breath.   Cardiovascular: Positive for leg swelling. Negative for chest pain and palpitations.  Gastrointestinal: Negative for abdominal pain, constipation, heartburn, nausea and vomiting.  Musculoskeletal: Negative for back pain, joint pain and myalgias.  Skin: Negative.   Neurological: Positive for dizziness and weakness.  Psychiatric/Behavioral: The patient is not nervous/anxious.     Physical Exam Constitutional:      General: She is not in acute distress.    Appearance: She is well-developed. She is obese. She is not diaphoretic.  Neck:     Musculoskeletal: Neck supple.     Thyroid: No thyromegaly.  Cardiovascular:     Rate and Rhythm: Normal rate. Rhythm irregular.     Pulses: Normal pulses.     Heart sounds: Murmur present.     Comments: 1/6 Pulmonary:     Effort: Pulmonary effort is normal. No respiratory distress.     Breath sounds: Normal breath sounds.     Comments: 02 dependent  Abdominal:     General: Bowel sounds are normal. There is no distension.     Palpations: Abdomen is soft.     Tenderness: There is no abdominal tenderness.  Musculoskeletal:     Right lower leg: Edema present.     Left lower leg: Edema present.     Comments: Is able to move all extremities Has 3-4+ bilateral lower extremity edema  History of bilateral knee replacements    Lymphadenopathy:      Cervical: No cervical adenopathy.  Skin:    General: Skin is warm and dry.     Comments: Status post right mastectomy 1994  Neurological:     Mental Status: She is alert and oriented to person, place, and time.  Psychiatric:        Mood and Affect: Mood normal.       ASSESSMENT/ PLAN:  TODAY:   1.  New onset vertigo and diplopia: will setup ct of head without contrast and will monitor her status   MD is aware of resident's narcotic use and is in agreement with current plan of care. We will attempt to wean resident as apropriate   Ok Edwards NP [facility]  Contact [phone] Monday through Friday 8am- 5pm  After hours call [phone]

## 2018-05-07 ENCOUNTER — Encounter: Payer: Self-pay | Admitting: Adult Health

## 2018-05-07 ENCOUNTER — Non-Acute Institutional Stay (SKILLED_NURSING_FACILITY): Payer: Medicare Other | Admitting: Adult Health

## 2018-05-07 ENCOUNTER — Other Ambulatory Visit
Admission: RE | Admit: 2018-05-07 | Discharge: 2018-05-07 | Disposition: A | Discharge status: Home / Self Care | Payer: No Typology Code available for payment source | Source: Ambulatory Visit | Attending: Adult Health | Admitting: Adult Health

## 2018-05-07 DIAGNOSIS — I110 Hypertensive heart disease with heart failure: Secondary | ICD-10-CM | POA: Insufficient documentation

## 2018-05-07 DIAGNOSIS — I5033 Acute on chronic diastolic (congestive) heart failure: Secondary | ICD-10-CM | POA: Diagnosis not present

## 2018-05-07 DIAGNOSIS — J9621 Acute and chronic respiratory failure with hypoxia: Secondary | ICD-10-CM

## 2018-05-07 DIAGNOSIS — H532 Diplopia: Secondary | ICD-10-CM | POA: Diagnosis not present

## 2018-05-07 LAB — BASIC METABOLIC PANEL
Anion gap: 7 (ref 5–15)
BUN: 25 mg/dL — ABNORMAL HIGH (ref 8–23)
CO2: 28 mmol/L (ref 22–32)
Calcium: 8.3 mg/dL — ABNORMAL LOW (ref 8.9–10.3)
Chloride: 107 mmol/L (ref 98–111)
Creatinine, Ser: 1.47 mg/dL — ABNORMAL HIGH (ref 0.44–1.00)
GFR calc Af Amer: 36 mL/min — ABNORMAL LOW (ref >60)
GFR calc non Af Amer: 31 mL/min — ABNORMAL LOW (ref >60)
Glucose, Bld: 105 mg/dL — ABNORMAL HIGH (ref 70–99)
Potassium: 3.6 mmol/L (ref 3.5–5.1)
SODIUM: 142 mmol/L (ref 135–145)

## 2018-05-07 LAB — URINALYSIS, COMPLETE (UACMP) WITH MICROSCOPIC
Bilirubin Urine: NEGATIVE
GLUCOSE, UA: NEGATIVE mg/dL
Ketones, ur: NEGATIVE mg/dL
NITRITE: POSITIVE — AB
Protein, ur: NEGATIVE mg/dL
Specific Gravity, Urine: 1.01 (ref 1.005–1.030)
WBC, UA: >50 WBC/hpf — ABNORMAL HIGH (ref 0–5)
pH: 6 (ref 5.0–8.0)

## 2018-05-07 NOTE — Progress Notes (Signed)
[facility] Room Number: [place] of Service:  SNF (31)   CODE STATUS: DNR  Allergies  Allergen Reactions  . Penicillins Swelling    Chief Complaint  Patient presents with  . Acute Visit    Care Plan Meeting    HPI:  We have come together for her routine care plan meeting. She does have family present. She continues to diplopia; she had been wearing contacts due to her history of cataract surgery. Has a history of retina detachment. She is ambulating with therapy. She continues to have issues with her 02 saturation. Her sats at rest are 97% and with activity of 87% on room air. She has not attempted steps at this time. She is complaining of dysuria with increased frequency and incontinence. More than likely she will require ALF upon discharge from this facility.   Past Medical History:  Diagnosis Date  . Breast cancer ([place]) 1994   right breast  . CKD (chronic kidney disease)   . Hypertension   . Tachycardia     Past Surgical History:  Procedure Laterality Date  . MASTECTOMY Right 1994    Social History   Socioeconomic History  . Marital status: Married    Spouse name: Not on file  . Number of children: Not on file  . Years of education: Not on file  . Highest education level: Not on file  Occupational History  . Not on file  Social Needs  . Financial resource strain: Not on file  . Food insecurity:    Worry: Not on file    Inability: Not on file  . Transportation needs:    Medical: Not on file    Non-medical: Not on file  Tobacco Use  . Smoking status: Never Smoker  . Smokeless tobacco: Never Used  Substance and Sexual Activity  . Alcohol use: Never    Frequency: Never  . Drug use: Never  . Sexual activity: Not on file  Lifestyle  . Physical activity:    Days per week: Not on file    Minutes per session: Not on file  . Stress: Not on file  Relationships  . Social connections:    Talks on phone: Not on file     Gets together: Not on file    Attends religious service: Not on file    Active member of club or organization: Not on file    Attends meetings of clubs or organizations: Not on file    Relationship status: Not on file  . Intimate partner violence:    Fear of current or ex partner: Not on file    Emotionally abused: Not on file    Physically abused: Not on file    Forced sexual activity: Not on file  Other Topics Concern  . Not on file  Social History Narrative  . Not on file   History reviewed. No pertinent family history.    VITAL SIGNS BP (!) 141/67   Pulse 66   Temp 98 F (36.7 C)   Resp 18   Ht 5\' 1"  (1.549 m)   Wt 208 lb 9.6 oz (94.6 kg)   SpO2 100%   BMI 39.41 kg/m   Outpatient Encounter Medications as of 05/07/2018  Medication Sig  . amiodarone (PACERONE) 200 MG tablet Take 1 tablet (200 mg total) by mouth daily.  Marland Kitchen apixaban (ELIQUIS) 2.5 MG TABS tablet Take 1 tablet (2.5 mg total) by mouth 2 (two) times  daily.  . aspirin EC 81 MG tablet Take 81 mg by mouth daily.  . budesonide (PULMICORT) 0.5 MG/2ML nebulizer solution Take 2 mLs (0.5 mg total) by nebulization 2 (two) times daily.  . cholecalciferol (VITAMIN D3) 25 MCG (1000 UT) tablet Take 1,000 Units by mouth daily.  . cyanocobalamin ([employer] VITAMIN B12) 2000 MCG tablet Take 1,000 mcg by mouth daily.  Marland Kitchen diltiazem (CARDIZEM CD) 240 MG 24 hr capsule Take 1 capsule (240 mg total) by mouth daily.  . ferrous sulfate 325 (65 FE) MG tablet Take 325 mg by mouth daily.  Marland Kitchen ipratropium-albuterol (DUONEB) 0.5-2.5 (3) MG/3ML SOLN Take 3 mLs by nebulization every 4 (four) hours as needed (SOB, wheezing).  . metoprolol (TOPROL-XL) 200 MG 24 hr tablet Take 100 mg by mouth 2 (two) times daily.  . Multiple Vitamins-Minerals (HAIR SKIN AND NAILS FORMULA) TABS Take 1 tablet by mouth daily.  . NON FORMULARY Fluid Restriction - 1500 ml / day  Diet Type - NAS  . Nutritional Supplements (ENSURE ENLIVE PO) Give by mouth two times daily  between meals  . OXYGEN Inhale 2 L/min into the lungs continuous.  . potassium chloride (K-DUR) 10 MEQ tablet Take 1 tablet (10 mEq total) by mouth every morning.  . pravastatin (PRAVACHOL) 40 MG tablet Take 40 mg by mouth every other day.  . torsemide (DEMADEX) 20 MG tablet Take 20 mg by mouth daily.  . traZODone (DESYREL) 100 MG tablet Take 1 tablet (100 mg total) by mouth at bedtime as needed for sleep.   No facility-administered encounter medications on file as of 05/07/2018.      SIGNIFICANT DIAGNOSTIC EXAMS   PREVIOUS;   04-18-18: ct of head/ct maxillofacial:  1. No acute intracranial abnormality 2. Negative for facial fracture.   04-19-18: 2-d echo: Left ventricle: The cavity size was normal. There was moderate oncentric hypertrophy. Systolic function was normal. The estimated ejection fraction was in the range of 55% to 60%.  04-21-18: renal ultrasound: Bilateral renal cysts. No significant or acute finding by ultrasound.  04-26-18: chest x-ray:  1. Cardiomegaly and bilateral effusions may indicate mild CHF. 2. However patchy opacities bilaterally particularly in the right upper lobe are suspicious for superimposed pneumonia and follow-up two-view chest x-ray is recommended.  04-27-18: ct of chest:  1. Small left greater than right pleural effusions with adjacent atelectasis. 2. Patchy airspace opacities in the upper lobes, right greater than left as well as superior segment of right lower lobe concerning for multilobar pneumonia and atelectasis. Followup PA and lateral chest X-ray is recommended in 3-4 weeks following trial of antibiotic therapy to ensure resolution and exclude underlying malignancy. 3. Mild reactive mediastinal lymphadenopathy. Aortic Atherosclerosis   NO NEW EXAMS.     LABS REVIEWED PREVIOUS:   04-18-18: wbc 11.4; hgb 15.1 hct; 47.1; mcv 88.; plt 280; glucose 129; bun 38; creat 1.21; k+ 3.2; na++ 142; ca 9.6; ast 119; alt 77; total bili 1.4;  albumin 4.0 mag 2.0  CK 1767  tsh 1.173  04-20-18: wbc 9.5; hgb 12.2; hct 38.9; mcv 90.5; plt 219; glucose 141; bun 35; creat 1.12 k+ 4.0; na++ 137; ca 8.4; mag 2.4 phos 2.2  CK 307 04-25-18: BNP 1222.0 04-27-18: wbc 10.1; hgb 10.1; hct 32.7; mcv 91.9; plt 258; glucose 122; bun 35; creat 1.43; k+ 3.7; na++ 138; ca 8.3  21-31-19: wbc 6.2; hgb 9.5; hct 29.1; mcv 92.1; plt 276   NO NEW LABS.    Review of Systems  Constitutional: Negative for  malaise/fatigue.  Eyes: Positive for double vision.  Respiratory: Negative for cough and shortness of breath.   Cardiovascular: Negative for chest pain, palpitations and leg swelling.  Gastrointestinal: Negative for abdominal pain, constipation and heartburn.  Genitourinary: Positive for dysuria and frequency.       Incontinence   Musculoskeletal: Negative for back pain, joint pain and myalgias.  Skin: Negative.   Neurological: Negative for dizziness.  Psychiatric/Behavioral: The patient is not nervous/anxious.      Physical Exam Constitutional:      General: She is not in acute distress.    Appearance: She is well-developed. She is obese. She is not diaphoretic.  Neck:     Musculoskeletal: Neck supple.     Thyroid: No thyromegaly.  Cardiovascular:     Rate and Rhythm: Normal rate and regular rhythm.     Pulses: Normal pulses.     Heart sounds: Murmur present.     Comments: 1/6 Pulmonary:     Effort: Pulmonary effort is normal. No respiratory distress.     Breath sounds: Normal breath sounds.     Comments: 02 dependent  Abdominal:     General: Bowel sounds are normal. There is no distension.     Palpations: Abdomen is soft.     Tenderness: There is no abdominal tenderness.  Musculoskeletal:     Right lower leg: Edema present.     Left lower leg: Edema present.     Comments: Is able to move all extremities Has 3-4+ bilateral lower extremity edema  History of bilateral knee replacements     Lymphadenopathy:     Cervical: No cervical  adenopathy.  Skin:    General: Skin is warm and dry.     Comments: Status post right mastectomy 1994   Neurological:     Mental Status: She is alert and oriented to person, place, and time.  Psychiatric:        Mood and Affect: Mood normal.       ASSESSMENT/ PLAN:  TODAY;   1. Acute on chronic diastolic heart failure 2. Acute on chronic respiratory failure with hypoxia 3. Diplopia  Will have her return to her eye doctor to further evaulate her diplopia Will continue therapy as directed Will continue her current plan of care The goal of her care at this time is to go to assisted living.     MD is aware of resident's narcotic use and is in agreement with current plan of care. We will attempt to wean resident as apropriate   Ok Edwards NP [facility]  Contact [phone] Monday through Friday 8am- 5pm  After hours call [phone]

## 2018-05-08 DIAGNOSIS — R42 Dizziness and giddiness: Secondary | ICD-10-CM | POA: Insufficient documentation

## 2018-05-08 DIAGNOSIS — H532 Diplopia: Secondary | ICD-10-CM | POA: Insufficient documentation

## 2018-05-09 ENCOUNTER — Non-Acute Institutional Stay (SKILLED_NURSING_FACILITY): Payer: Medicare Other | Admitting: Adult Health

## 2018-05-09 ENCOUNTER — Encounter: Payer: Self-pay | Admitting: Adult Health

## 2018-05-09 DIAGNOSIS — E1122 Type 2 diabetes mellitus with diabetic chronic kidney disease: Secondary | ICD-10-CM | POA: Diagnosis not present

## 2018-05-09 DIAGNOSIS — N184 Chronic kidney disease, stage 4 (severe): Secondary | ICD-10-CM

## 2018-05-09 DIAGNOSIS — I129 Hypertensive chronic kidney disease with stage 1 through stage 4 chronic kidney disease, or unspecified chronic kidney disease: Secondary | ICD-10-CM

## 2018-05-09 DIAGNOSIS — I5033 Acute on chronic diastolic (congestive) heart failure: Secondary | ICD-10-CM | POA: Diagnosis not present

## 2018-05-09 DIAGNOSIS — I130 Hypertensive heart and chronic kidney disease with heart failure and stage 1 through stage 4 chronic kidney disease, or unspecified chronic kidney disease: Secondary | ICD-10-CM

## 2018-05-09 DIAGNOSIS — I5032 Chronic diastolic (congestive) heart failure: Secondary | ICD-10-CM

## 2018-05-09 NOTE — Progress Notes (Signed)
[place]   [facility] [place] [facility] Room Number: [place] of Service:  SNF (31)   CODE STATUS: DNR  Allergies  Allergen Reactions  . Penicillins Swelling    Chief Complaint  Patient presents with  . Medical Management of Chronic Issues    Hypertensive heart and kidney disease with chronic diastolic congestive heart failure and stage 4 chronic kidney disease; type 2 diabetes mellitus with stage 4 chronic kidney disease and hypertension; acute on chronic diastolic heart failure; stage 4 chronic renal impairment associated with type 2 diabetes mellitus. Weekly follow up for the first 30 days post hospitalization.     HPI:  She is a 85 year old short term rehab patient being seen for the management of her chronic illnesses: hypertensive heart disease; diabetes heart failure and ckd stage 4. She denies any cough or shortness of breath. She does have lower extremity edema. She continues to require 02 with activity. She continues to participate in therapy. She denies any uncontrolled pain.   Past Medical History:  Diagnosis Date  . Breast cancer ([place]) 1994   right breast  . CKD (chronic kidney disease)   . Hypertension   . Tachycardia     Past Surgical History:  Procedure Laterality Date  . MASTECTOMY Right 1994    Social History   Socioeconomic History  . Marital status: Married    Spouse name: Not on file  . Number of children: Not on file  . Years of education: Not on file  . Highest education level: Not on file  Occupational History  . Not on file  Social Needs  . Financial resource strain: Not on file  . Food insecurity:    Worry: Not on file    Inability: Not on file  . Transportation needs:    Medical: Not on file    Non-medical: Not on file  Tobacco Use  . Smoking status: Never Smoker  . Smokeless tobacco: Never Used  Substance and Sexual Activity  . Alcohol use: Never    Frequency: Never  . Drug use: Never  . Sexual activity: Not on  file  Lifestyle  . Physical activity:    Days per week: Not on file    Minutes per session: Not on file  . Stress: Not on file  Relationships  . Social connections:    Talks on phone: Not on file    Gets together: Not on file    Attends religious service: Not on file    Active member of club or organization: Not on file    Attends meetings of clubs or organizations: Not on file    Relationship status: Not on file  . Intimate partner violence:    Fear of current or ex partner: Not on file    Emotionally abused: Not on file    Physically abused: Not on file    Forced sexual activity: Not on file  Other Topics Concern  . Not on file  Social History Narrative  . Not on file   History reviewed. No pertinent family history.    VITAL SIGNS BP (!) 157/67   Pulse 72   Temp 98 F (36.7 C)   Resp 18   Ht 5\' 1"  (1.549 m)   Wt 204 lb 4.8 oz (92.7 kg)   SpO2 96%   BMI 38.60 kg/m   Outpatient Encounter Medications as of 05/09/2018  Medication Sig  . amiodarone (PACERONE) 200 MG tablet Take 1 tablet (200 mg  total) by mouth daily.  Marland Kitchen apixaban (ELIQUIS) 2.5 MG TABS tablet Take 1 tablet (2.5 mg total) by mouth 2 (two) times daily.  Marland Kitchen aspirin EC 81 MG tablet Take 81 mg by mouth daily.  . budesonide (PULMICORT) 0.5 MG/2ML nebulizer solution Take 2 mLs (0.5 mg total) by nebulization 2 (two) times daily.  . cholecalciferol (VITAMIN D3) 25 MCG (1000 UT) tablet Take 1,000 Units by mouth daily.  . cyanocobalamin ([employer]S VITAMIN B12) 2000 MCG tablet Take 1,000 mcg by mouth daily.  Marland Kitchen diltiazem (CARDIZEM CD) 240 MG 24 hr capsule Take 1 capsule (240 mg total) by mouth daily.  . ferrous sulfate 325 (65 FE) MG tablet Take 325 mg by mouth daily.  Marland Kitchen ipratropium-albuterol (DUONEB) 0.5-2.5 (3) MG/3ML SOLN Take 3 mLs by nebulization every 4 (four) hours as needed (SOB, wheezing).  . metoprolol (TOPROL-XL) 200 MG 24 hr tablet Take 100 mg by mouth 2 (two) times daily.  . Multiple Vitamins-Minerals (HAIR SKIN  AND NAILS FORMULA) TABS Take 1 tablet by mouth daily.  . NON FORMULARY Fluid Restriction - 1500 ml / day  Diet Type - NAS  . Nutritional Supplements (ENSURE ENLIVE PO) Give by mouth two times daily between meals  . OXYGEN Inhale 2 L/min into the lungs continuous.  . potassium chloride (K-DUR) 10 MEQ tablet Take 1 tablet (10 mEq total) by mouth every morning.  . pravastatin (PRAVACHOL) 40 MG tablet Take 40 mg by mouth every other day.  . torsemide (DEMADEX) 20 MG tablet Take 20 mg by mouth daily.  . traZODone (DESYREL) 100 MG tablet Take 1 tablet (100 mg total) by mouth at bedtime as needed for sleep.   No facility-administered encounter medications on file as of 05/09/2018.      SIGNIFICANT DIAGNOSTIC EXAMS   PREVIOUS;   04-18-18: ct of head/ct maxillofacial:  1. No acute intracranial abnormality 2. Negative for facial fracture.   04-19-18: 2-d echo: Left ventricle: The cavity size was normal. There was moderate oncentric hypertrophy. Systolic function was normal. The estimated ejection fraction was in the range of 55% to 60%.  04-21-18: renal ultrasound: Bilateral renal cysts. No significant or acute finding by ultrasound.  04-26-18: chest x-ray:  1. Cardiomegaly and bilateral effusions may indicate mild CHF. 2. However patchy opacities bilaterally particularly in the right upper lobe are suspicious for superimposed pneumonia and follow-up two-view chest x-ray is recommended.  04-27-18: ct of chest:  1. Small left greater than right pleural effusions with adjacent atelectasis. 2. Patchy airspace opacities in the upper lobes, right greater than left as well as superior segment of right lower lobe concerning for multilobar pneumonia and atelectasis. Followup PA and lateral chest X-ray is recommended in 3-4 weeks following trial of antibiotic therapy to ensure resolution and exclude underlying malignancy. 3. Mild reactive mediastinal lymphadenopathy. Aortic Atherosclerosis   NO  NEW EXAMS.     LABS REVIEWED PREVIOUS:   04-18-18: wbc 11.4; hgb 15.1 hct; 47.1; mcv 88.; plt 280; glucose 129; bun 38; creat 1.21; k+ 3.2; na++ 142; ca 9.6; ast 119; alt 77; total bili 1.4; albumin 4.0 mag 2.0  CK 1767  tsh 1.173  04-20-18: wbc 9.5; hgb 12.2; hct 38.9; mcv 90.5; plt 219; glucose 141; bun 35; creat 1.12 k+ 4.0; na++ 137; ca 8.4; mag 2.4 phos 2.2  CK 307 04-25-18: BNP 1222.0 04-27-18: wbc 10.1; hgb 10.1; hct 32.7; mcv 91.9; plt 258; glucose 122; bun 35; creat 1.43; k+ 3.7; na++ 138; ca 8.3  21-31-19: wbc 6.2;  hgb 9.5; hct 29.1; mcv 92.1; plt 276   NO NEW LABS.    Review of Systems  Constitutional: Negative for malaise/fatigue.  Respiratory: Negative for cough and shortness of breath.   Cardiovascular: Positive for leg swelling. Negative for chest pain and palpitations.  Gastrointestinal: Negative for abdominal pain, constipation and heartburn.  Musculoskeletal: Negative for back pain, joint pain and myalgias.  Skin: Negative.   Neurological: Negative for dizziness.  Psychiatric/Behavioral: The patient is not nervous/anxious.       Physical Exam Constitutional:      General: She is not in acute distress.    Appearance: She is well-developed. She is obese. She is not diaphoretic.  Neck:     Thyroid: No thyromegaly.  Cardiovascular:     Rate and Rhythm: Normal rate and regular rhythm.     Pulses: Normal pulses.     Heart sounds: Murmur present.     Comments: 1/6 Pulmonary:     Effort: Pulmonary effort is normal. No respiratory distress.     Breath sounds: Normal breath sounds.     Comments: 02 dependent  Abdominal:     General: Bowel sounds are normal. There is no distension.     Palpations: Abdomen is soft.     Tenderness: There is no abdominal tenderness.  Musculoskeletal:     Right lower leg: Edema present.     Left lower leg: Edema present.     Comments: Is able to move all extremities Has 3-4+ bilateral lower extremity edema  History of bilateral  knee replacements      Lymphadenopathy:     Cervical: No cervical adenopathy.  Skin:    General: Skin is warm and dry.     Comments: Status post right mastectomy 1994    Neurological:     Mental Status: She is alert and oriented to person, place, and time.  Psychiatric:        Mood and Affect: Mood normal.      ASSESSMENT/ PLAN:   TODAY:   1.  Type 2 diabetes mellitus stage 4 chronic kidney disease and hypertension: is stable hgb a1c 5.7. will not make changes and will monitor  2. Stage 4 chronic renal impairment associated with type 2 diabetes mellitus: is stable bun 35; creat 1.43  3. Hypertensive heart and kidney disease with chronic diastolic congestive failure and stage 4 chronic kidney disease: is stable b/p 133/98: will continue toprol xl 100 mg twice daily and apresoline 50 mg every 8 hours  4. Acute on chronic diastolic heart failure: is stable EF 55-60% (04-19-18); will continue toprol xl 100 mg twice daily apresoline 50 mg every 8 hours will increase lasix to 40 mg daily and will continue k+ 10 meq daily  Is on 1500 cc fluid restriction   PREVIOUS  5. Paroxysmal afib heart rate is stable: will continue toprol xl 100 mg twice daily; cardizem cd 240 mg daily and amiodarone 200 mg daily for rate control will continue eliquis 2.5 mg twice daily and asa 81 mg daily   6. Dyslipidemia associated with type 2 diabetes mellitus: is stable will continue pravachol 40 mg daily   7. Anemia associated with stage 4 chronic renal failure: is stable hgb 9.5; will continue iron daily   8. Acute on chronic respiratory failure with hypoxia: is without change will continue 02 at 2L pulmicort neb twice daily duoneb every 4 hours as needed    MD is aware of resident's narcotic use and is in agreement with  current plan of care. We will attempt to wean resident as apropriate   Ok Edwards NP [facility]  Contact [phone] Monday through Friday 8am- 5pm  After hours call  [phone]

## 2018-05-10 ENCOUNTER — Non-Acute Institutional Stay (SKILLED_NURSING_FACILITY): Payer: Medicare Other | Admitting: Adult Health

## 2018-05-10 ENCOUNTER — Encounter: Payer: Self-pay | Admitting: Adult Health

## 2018-05-10 ENCOUNTER — Other Ambulatory Visit
Admission: RE | Admit: 2018-05-10 | Discharge: 2018-05-10 | Disposition: A | Discharge status: Home / Self Care | Payer: No Typology Code available for payment source | Source: Skilled Nursing Facility | Attending: Internal Medicine | Admitting: Internal Medicine

## 2018-05-10 DIAGNOSIS — N390 Urinary tract infection, site not specified: Secondary | ICD-10-CM | POA: Diagnosis not present

## 2018-05-10 DIAGNOSIS — B9629 Other Escherichia coli [E. coli] as the cause of diseases classified elsewhere: Secondary | ICD-10-CM | POA: Diagnosis not present

## 2018-05-10 DIAGNOSIS — Z1612 Extended spectrum beta lactamase (ESBL) resistance: Secondary | ICD-10-CM | POA: Diagnosis not present

## 2018-05-10 DIAGNOSIS — H532 Diplopia: Secondary | ICD-10-CM | POA: Insufficient documentation

## 2018-05-10 DIAGNOSIS — H538 Other visual disturbances: Secondary | ICD-10-CM | POA: Insufficient documentation

## 2018-05-10 LAB — CBC WITH DIFFERENTIAL/PLATELET
Abs Immature Granulocytes: 0.02 10*3/uL (ref 0.00–0.07)
BASOS ABS: 0 10*3/uL (ref 0.0–0.1)
Basophils Relative: 0 %
Eosinophils Absolute: 0.2 10*3/uL (ref 0.0–0.5)
Eosinophils Relative: 2 %
HCT: 34.9 % — ABNORMAL LOW (ref 36.0–46.0)
Hemoglobin: 10.7 g/dL — ABNORMAL LOW (ref 12.0–15.0)
Immature Granulocytes: 0 %
Lymphocytes Relative: 14 %
Lymphs Abs: 0.9 10*3/uL (ref 0.7–4.0)
MCH: 28.3 pg (ref 26.0–34.0)
MCHC: 30.7 g/dL (ref 30.0–36.0)
MCV: 92.3 fL (ref 80.0–100.0)
Monocytes Absolute: 0.5 10*3/uL (ref 0.1–1.0)
Monocytes Relative: 7 %
Neutro Abs: 5.1 10*3/uL (ref 1.7–7.7)
Neutrophils Relative %: 77 %
Platelets: 275 10*3/uL (ref 150–400)
RBC: 3.78 MIL/uL — ABNORMAL LOW (ref 3.87–5.11)
RDW: 15.7 % — AB (ref 11.5–15.5)
WBC: 6.7 10*3/uL (ref 4.0–10.5)
nRBC: 0 % (ref 0.0–0.2)

## 2018-05-10 LAB — URINE CULTURE: Culture: >=100000 — AB

## 2018-05-10 LAB — SEDIMENTATION RATE: Sed Rate: 27 mm/hr (ref 0–30)

## 2018-05-10 NOTE — Progress Notes (Signed)
[facility]:   [facility] Room Number: [place] of Service:  SNF (31)   CODE STATUS: DNR  Allergies  Allergen Reactions  . Penicillins Swelling    Chief Complaint  Patient presents with  . Acute Visit    UTI    HPI:  She had a ua/c&s due to her foul urine; increased incontinence and pubic pain. There are no reports of fevers present. Her culture demonstrates ESBL in her urine.   Past Medical History:  Diagnosis Date  . Breast cancer ([place]) 1994   right breast  . CKD (chronic kidney disease)   . Hypertension   . Tachycardia     Past Surgical History:  Procedure Laterality Date  . MASTECTOMY Right 1994    Social History   Socioeconomic History  . Marital status: Married    Spouse name: Not on file  . Number of children: Not on file  . Years of education: Not on file  . Highest education level: Not on file  Occupational History  . Not on file  Social Needs  . Financial resource strain: Not on file  . Food insecurity:    Worry: Not on file    Inability: Not on file  . Transportation needs:    Medical: Not on file    Non-medical: Not on file  Tobacco Use  . Smoking status: Never Smoker  . Smokeless tobacco: Never Used  Substance and Sexual Activity  . Alcohol use: Never    Frequency: Never  . Drug use: Never  . Sexual activity: Not on file  Lifestyle  . Physical activity:    Days per week: Not on file    Minutes per session: Not on file  . Stress: Not on file  Relationships  . Social connections:    Talks on phone: Not on file    Gets together: Not on file    Attends religious service: Not on file    Active member of club or organization: Not on file    Attends meetings of clubs or organizations: Not on file    Relationship status: Not on file  . Intimate partner violence:    Fear of current or ex partner: Not on file    Emotionally abused: Not on file    Physically abused: Not on file    Forced sexual activity: Not  on file  Other Topics Concern  . Not on file  Social History Narrative  . Not on file   History reviewed. No pertinent family history.    VITAL SIGNS BP (!) 149/67   Pulse 66   Temp 98.6 F (37 C)   Resp 18   Ht 5\' 1"  (1.549 m)   Wt 202 lb 12.8 oz (92 kg)   SpO2 97%   BMI 38.32 kg/m   Outpatient Encounter Medications as of 05/10/2018  Medication Sig  . amiodarone (PACERONE) 200 MG tablet Take 1 tablet (200 mg total) by mouth daily.  Marland Kitchen apixaban (ELIQUIS) 2.5 MG TABS tablet Take 1 tablet (2.5 mg total) by mouth 2 (two) times daily.  Marland Kitchen aspirin EC 81 MG tablet Take 81 mg by mouth daily.  . budesonide (PULMICORT) 0.5 MG/2ML nebulizer solution Take 2 mLs (0.5 mg total) by nebulization 2 (two) times daily.  . cholecalciferol (VITAMIN D3) 25 MCG (1000 UT) tablet Take 1,000 Units by mouth daily.  . cyanocobalamin ([employer] VITAMIN B12) 1000 MCG tablet Take 1,000 mcg by mouth daily.   Marland Kitchen diltiazem (  CARDIZEM CD) 240 MG 24 hr capsule Take 1 capsule (240 mg total) by mouth daily.  . ferrous sulfate 325 (65 FE) MG tablet Take 325 mg by mouth daily.  Marland Kitchen ipratropium-albuterol (DUONEB) 0.5-2.5 (3) MG/3ML SOLN Take 3 mLs by nebulization every 4 (four) hours as needed (SOB, wheezing).  . metoprolol (TOPROL-XL) 200 MG 24 hr tablet Take 100 mg by mouth 2 (two) times daily.  . Multiple Vitamins-Minerals (HAIR SKIN AND NAILS FORMULA) TABS Take 1 tablet by mouth daily.  . NON FORMULARY Fluid Restriction - 1500 ml / day  Diet Type - NAS  . Nutritional Supplements (ENSURE ENLIVE PO) Give by mouth two times daily between meals  . OXYGEN Inhale 2 L/min into the lungs continuous.  . potassium chloride (K-DUR) 10 MEQ tablet Take 1 tablet (10 mEq total) by mouth every morning.  . pravastatin (PRAVACHOL) 40 MG tablet Take 40 mg by mouth every other day.  . torsemide (DEMADEX) 20 MG tablet Take 20 mg by mouth daily.  . traZODone (DESYREL) 100 MG tablet Take 1 tablet (100 mg total) by mouth at bedtime as needed for  sleep.   No facility-administered encounter medications on file as of 05/10/2018.      SIGNIFICANT DIAGNOSTIC EXAMS   PREVIOUS;   04-18-18: ct of head/ct maxillofacial:  1. No acute intracranial abnormality 2. Negative for facial fracture.   04-19-18: 2-d echo: Left ventricle: The cavity size was normal. There was moderate oncentric hypertrophy. Systolic function was normal. The estimated ejection fraction was in the range of 55% to 60%.  04-21-18: renal ultrasound: Bilateral renal cysts. No significant or acute finding by ultrasound.  04-26-18: chest x-ray:  1. Cardiomegaly and bilateral effusions may indicate mild CHF. 2. However patchy opacities bilaterally particularly in the right upper lobe are suspicious for superimposed pneumonia and follow-up two-view chest x-ray is recommended.  04-27-18: ct of chest:  1. Small left greater than right pleural effusions with adjacent atelectasis. 2. Patchy airspace opacities in the upper lobes, right greater than left as well as superior segment of right lower lobe concerning for multilobar pneumonia and atelectasis. Followup PA and lateral chest X-ray is recommended in 3-4 weeks following trial of antibiotic therapy to ensure resolution and exclude underlying malignancy. 3. Mild reactive mediastinal lymphadenopathy. Aortic Atherosclerosis   NO NEW EXAMS.     LABS REVIEWED PREVIOUS:   04-18-18: wbc 11.4; hgb 15.1 hct; 47.1; mcv 88.; plt 280; glucose 129; bun 38; creat 1.21; k+ 3.2; na++ 142; ca 9.6; ast 119; alt 77; total bili 1.4; albumin 4.0 mag 2.0  CK 1767  tsh 1.173  04-20-18: wbc 9.5; hgb 12.2; hct 38.9; mcv 90.5; plt 219; glucose 141; bun 35; creat 1.12 k+ 4.0; na++ 137; ca 8.4; mag 2.4 phos 2.2  CK 307 04-25-18: BNP 1222.0 04-27-18: wbc 10.1; hgb 10.1; hct 32.7; mcv 91.9; plt 258; glucose 122; bun 35; creat 1.43; k+ 3.7; na++ 138; ca 8.3  04-30-18: wbc 6.2; hgb 9.5; hct 29.1; mcv 92.1; plt 276   TODAY;   05-07-18: urine  culture:  E-coli ESBL: septra    Review of Systems  Constitutional: Negative for malaise/fatigue.  Respiratory: Negative for cough and shortness of breath.   Cardiovascular: Negative for chest pain, palpitations and leg swelling.  Gastrointestinal: Negative for abdominal pain, constipation and heartburn.  Genitourinary:       Foul urine; incontinence; pubic pain   Musculoskeletal: Negative for back pain, joint pain and myalgias.  Skin: Negative.   Neurological: Negative  for dizziness.  Psychiatric/Behavioral: The patient is not nervous/anxious.     Physical Exam Constitutional:      General: She is not in acute distress.    Appearance: She is well-developed. She is obese. She is not diaphoretic.  Neck:     Musculoskeletal: Neck supple.     Thyroid: No thyromegaly.  Cardiovascular:     Rate and Rhythm: Normal rate and regular rhythm.     Pulses: Normal pulses.     Heart sounds: Murmur present.     Comments: 1/6 Pulmonary:     Effort: Pulmonary effort is normal. No respiratory distress.     Breath sounds: Normal breath sounds.     Comments: 02 dependent  Abdominal:     General: Bowel sounds are normal. There is no distension.     Palpations: Abdomen is soft.     Tenderness: There is no abdominal tenderness.  Musculoskeletal:     Right lower leg: Edema present.     Left lower leg: Edema present.     Comments: Is able to move all extremities Has 3-4+ bilateral lower extremity edema  History of bilateral knee replacements       Lymphadenopathy:     Cervical: No cervical adenopathy.  Skin:    General: Skin is warm and dry.     Comments: Status post right mastectomy 1994     Neurological:     Mental Status: She is alert and oriented to person, place, and time.     ASSESSMENT/ PLAN:  TODAY:  1. UTI due to extended-spectrum beta lactamase (ESBL): producing Escherichia coli: worse  will begin septra DS twice daily through 05-20-18 with a probiotic twice daily through  05-25-18   MD is aware of resident's narcotic use and is in agreement with current plan of care. We will attempt to wean resident as apropriate   Ok Edwards NP [facility]  Contact [phone] Monday through Friday 8am- 5pm  After hours call [phone]

## 2018-05-13 ENCOUNTER — Non-Acute Institutional Stay (SKILLED_NURSING_FACILITY): Payer: Medicare Other | Admitting: Adult Health

## 2018-05-13 ENCOUNTER — Encounter: Payer: Self-pay | Admitting: Adult Health

## 2018-05-13 DIAGNOSIS — N184 Chronic kidney disease, stage 4 (severe): Secondary | ICD-10-CM

## 2018-05-13 DIAGNOSIS — E1122 Type 2 diabetes mellitus with diabetic chronic kidney disease: Secondary | ICD-10-CM

## 2018-05-13 DIAGNOSIS — I5033 Acute on chronic diastolic (congestive) heart failure: Secondary | ICD-10-CM | POA: Diagnosis not present

## 2018-05-13 DIAGNOSIS — I5032 Chronic diastolic (congestive) heart failure: Secondary | ICD-10-CM

## 2018-05-13 DIAGNOSIS — I130 Hypertensive heart and chronic kidney disease with heart failure and stage 1 through stage 4 chronic kidney disease, or unspecified chronic kidney disease: Secondary | ICD-10-CM

## 2018-05-13 NOTE — Progress Notes (Signed)
[facility]   [facility] Room Number: [place] of Service:  SNF (31)   CODE STATUS: DNR  Allergies  Allergen Reactions  . Penicillins Swelling    Chief Complaint  Patient presents with  . Acute Visit    Bilateral lower extrermity edema    HPI:  She has worsening bilateral lower extremity edema. She denies any chest pain or shortness of breath. Her legs feel tight but do not hurt. She is concerned about her edema.  She does have a history of diastolic heart failure and stage IV kidney disease.   Past Medical History:  Diagnosis Date  . Breast cancer ([place]) 1994   right breast  . CKD (chronic kidney disease)   . Hypertension   . Tachycardia     Past Surgical History:  Procedure Laterality Date  . MASTECTOMY Right 1994    Social History   Socioeconomic History  . Marital status: Married    Spouse name: Not on file  . Number of children: Not on file  . Years of education: Not on file  . Highest education level: Not on file  Occupational History  . Not on file  Social Needs  . Financial resource strain: Not on file  . Food insecurity:    Worry: Not on file    Inability: Not on file  . Transportation needs:    Medical: Not on file    Non-medical: Not on file  Tobacco Use  . Smoking status: Never Smoker  . Smokeless tobacco: Never Used  Substance and Sexual Activity  . Alcohol use: Never    Frequency: Never  . Drug use: Never  . Sexual activity: Not on file  Lifestyle  . Physical activity:    Days per week: Not on file    Minutes per session: Not on file  . Stress: Not on file  Relationships  . Social connections:    Talks on phone: Not on file    Gets together: Not on file    Attends religious service: Not on file    Active member of club or organization: Not on file    Attends meetings of clubs or organizations: Not on file    Relationship status: Not on file  . Intimate partner violence:    Fear of current or ex  partner: Not on file    Emotionally abused: Not on file    Physically abused: Not on file    Forced sexual activity: Not on file  Other Topics Concern  . Not on file  Social History Narrative  . Not on file   History reviewed. No pertinent family history.    VITAL SIGNS BP (!) 149/56   Pulse 63   Temp 98.4 F (36.9 C)   Resp 18   Ht 5\' 1"  (1.549 m)   Wt 196 lb 8 oz (89.1 kg)   SpO2 97%   BMI 37.13 kg/m   Outpatient Encounter Medications as of 05/13/2018  Medication Sig  . amiodarone (PACERONE) 200 MG tablet Take 1 tablet (200 mg total) by mouth daily.  Marland Kitchen apixaban (ELIQUIS) 2.5 MG TABS tablet Take 1 tablet (2.5 mg total) by mouth 2 (two) times daily.  Marland Kitchen aspirin EC 81 MG tablet Take 81 mg by mouth daily.  . budesonide (PULMICORT) 0.5 MG/2ML nebulizer solution Take 2 mLs (0.5 mg total) by nebulization 2 (two) times daily.  . cholecalciferol (VITAMIN D3) 25 MCG (1000 UT) tablet Take 1,000 Units by mouth  daily.  . cyanocobalamin ([employer]S VITAMIN B12) 1000 MCG tablet Take 1,000 mcg by mouth daily.   Marland Kitchen diltiazem (CARDIZEM CD) 240 MG 24 hr capsule Take 1 capsule (240 mg total) by mouth daily.  . ferrous sulfate 325 (65 FE) MG tablet Take 325 mg by mouth daily.  Marland Kitchen ipratropium-albuterol (DUONEB) 0.5-2.5 (3) MG/3ML SOLN Take 3 mLs by nebulization every 4 (four) hours as needed (SOB, wheezing).  . metoprolol (TOPROL-XL) 200 MG 24 hr tablet Take 100 mg by mouth 2 (two) times daily.  . Multiple Vitamins-Minerals (HAIR SKIN AND NAILS FORMULA) TABS Take 1 tablet by mouth daily.  . NON FORMULARY Fluid Restriction - 1500 ml / day  Diet Type - NAS  . Nutritional Supplements (ENSURE ENLIVE PO) Give by mouth two times daily between meals  . OXYGEN Inhale 2 L/min into the lungs continuous.  . potassium chloride (K-DUR) 10 MEQ tablet Take 1 tablet (10 mEq total) by mouth every morning.  . pravastatin (PRAVACHOL) 40 MG tablet Take 40 mg by mouth every other day.  . Probiotic Product (RISA-BID  PROBIOTIC) TABS Take 1 tablet by mouth 3 (three) times daily.  Marland Kitchen sulfamethoxazole-trimethoprim (BACTRIM DS,SEPTRA DS) 800-160 MG tablet Take 1 tablet by mouth 2 (two) times daily.  Marland Kitchen torsemide (DEMADEX) 20 MG tablet Take 20 mg by mouth daily.  . traZODone (DESYREL) 100 MG tablet Take 1 tablet (100 mg total) by mouth at bedtime as needed for sleep.   No facility-administered encounter medications on file as of 05/13/2018.      SIGNIFICANT DIAGNOSTIC EXAMS   PREVIOUS;   04-18-18: ct of head/ct maxillofacial:  1. No acute intracranial abnormality 2. Negative for facial fracture.   04-19-18: 2-d echo: Left ventricle: The cavity size was normal. There was moderate oncentric hypertrophy. Systolic function was normal. The estimated ejection fraction was in the range of 55% to 60%.  04-21-18: renal ultrasound: Bilateral renal cysts. No significant or acute finding by ultrasound.  04-26-18: chest x-ray:  1. Cardiomegaly and bilateral effusions may indicate mild CHF. 2. However patchy opacities bilaterally particularly in the right upper lobe are suspicious for superimposed pneumonia and follow-up two-view chest x-ray is recommended.  04-27-18: ct of chest:  1. Small left greater than right pleural effusions with adjacent atelectasis. 2. Patchy airspace opacities in the upper lobes, right greater than left as well as superior segment of right lower lobe concerning for multilobar pneumonia and atelectasis. Followup PA and lateral chest X-ray is recommended in 3-4 weeks following trial of antibiotic therapy to ensure resolution and exclude underlying malignancy. 3. Mild reactive mediastinal lymphadenopathy. Aortic Atherosclerosis   NO NEW EXAMS.     LABS REVIEWED PREVIOUS:   04-18-18: wbc 11.4; hgb 15.1 hct; 47.1; mcv 88.; plt 280; glucose 129; bun 38; creat 1.21; k+ 3.2; na++ 142; ca 9.6; ast 119; alt 77; total bili 1.4; albumin 4.0 mag 2.0  CK 1767  tsh 1.173  04-20-18: wbc 9.5; hgb  12.2; hct 38.9; mcv 90.5; plt 219; glucose 141; bun 35; creat 1.12 k+ 4.0; na++ 137; ca 8.4; mag 2.4 phos 2.2  CK 307 04-25-18: BNP 1222.0 04-27-18: wbc 10.1; hgb 10.1; hct 32.7; mcv 91.9; plt 258; glucose 122; bun 35; creat 1.43; k+ 3.7; na++ 138; ca 8.3  04-30-18: wbc 6.2; hgb 9.5; hct 29.1; mcv 92.1; plt 276  05-07-18: urine culture:  E-coli ESBL: septra   TODAY ;  05-10-18: wbc 6.7; hgb 10.7; hct 34.9; mcv 92.3; plt 275 sed rate 27  Review of Systems  Constitutional: Negative for malaise/fatigue.  Respiratory: Negative for cough and shortness of breath.   Cardiovascular: Positive for leg swelling. Negative for chest pain and palpitations.  Gastrointestinal: Negative for abdominal pain, constipation and heartburn.       Denies any pubic pain  Musculoskeletal: Negative for back pain, joint pain and myalgias.  Skin: Negative.   Neurological: Negative for dizziness.  Psychiatric/Behavioral: The patient is not nervous/anxious.     Physical Exam Constitutional:      General: She is not in acute distress.    Appearance: She is well-developed. She is obese. She is not diaphoretic.  Neck:     Musculoskeletal: Neck supple.     Thyroid: No thyromegaly.  Cardiovascular:     Rate and Rhythm: Normal rate and regular rhythm.     Pulses: Normal pulses.     Heart sounds: Murmur present.     Comments: 1/6 Pulmonary:     Effort: Pulmonary effort is normal. No respiratory distress.     Breath sounds: Normal breath sounds.  Abdominal:     General: Bowel sounds are normal. There is no distension.     Palpations: Abdomen is soft.     Tenderness: There is no abdominal tenderness.  Musculoskeletal:     Right lower leg: Edema present.     Left lower leg: Edema present.     Comments:  Is able to move all extremities Has 4+ bilateral lower extremity edema  History of bilateral knee replacements        Lymphadenopathy:     Cervical: No cervical adenopathy.  Skin:    General: Skin is warm and  dry.     Comments: Status post right mastectomy 1994      Neurological:     Mental Status: She is alert and oriented to person, place, and time.  Psychiatric:        Mood and Affect: Mood normal.       ASSESSMENT/ PLAN:  TODAY:   1. 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 Acute on chronic diastolic heart failure 3. Stage 4 chronic renal impairment associated with type 2 diabetes mellitus   Will increase demadex to 20 mg twice daily through 05-16-18 then return back to one time daily Will increase k+ to twice daily through 05-16-18 then return to one time daily  Will check BMP 05-14-18    MD is aware of resident's narcotic use and is in agreement with current plan of care. We will attempt to wean resident as apropriate   Ok Edwards NP [facility]  Contact [phone] Monday through Friday 8am- 5pm  After hours call [phone]

## 2018-05-14 ENCOUNTER — Non-Acute Institutional Stay (SKILLED_NURSING_FACILITY): Payer: Medicare Other | Admitting: Adult Health

## 2018-05-14 ENCOUNTER — Encounter: Payer: Self-pay | Admitting: Adult Health

## 2018-05-14 ENCOUNTER — Other Ambulatory Visit
Admission: RE | Admit: 2018-05-14 | Discharge: 2018-05-14 | Disposition: A | Discharge status: Home / Self Care | Payer: No Typology Code available for payment source | Source: Ambulatory Visit | Attending: Adult Health | Admitting: Adult Health

## 2018-05-14 DIAGNOSIS — I130 Hypertensive heart and chronic kidney disease with heart failure and stage 1 through stage 4 chronic kidney disease, or unspecified chronic kidney disease: Secondary | ICD-10-CM

## 2018-05-14 DIAGNOSIS — B9629 Other Escherichia coli [E. coli] as the cause of diseases classified elsewhere: Secondary | ICD-10-CM

## 2018-05-14 DIAGNOSIS — Z1612 Extended spectrum beta lactamase (ESBL) resistance: Secondary | ICD-10-CM

## 2018-05-14 DIAGNOSIS — I5032 Chronic diastolic (congestive) heart failure: Secondary | ICD-10-CM | POA: Diagnosis not present

## 2018-05-14 DIAGNOSIS — N390 Urinary tract infection, site not specified: Secondary | ICD-10-CM

## 2018-05-14 DIAGNOSIS — N184 Chronic kidney disease, stage 4 (severe): Secondary | ICD-10-CM

## 2018-05-14 LAB — BASIC METABOLIC PANEL
Anion gap: 10 (ref 5–15)
BUN: 28 mg/dL — ABNORMAL HIGH (ref 8–23)
CO2: 27 mmol/L (ref 22–32)
Calcium: 9 mg/dL (ref 8.9–10.3)
Chloride: 103 mmol/L (ref 98–111)
Creatinine, Ser: 1.88 mg/dL — ABNORMAL HIGH (ref 0.44–1.00)
GFR calc Af Amer: 27 mL/min — ABNORMAL LOW (ref >60)
GFR calc non Af Amer: 23 mL/min — ABNORMAL LOW (ref >60)
Glucose, Bld: 108 mg/dL — ABNORMAL HIGH (ref 70–99)
Potassium: 3.7 mmol/L (ref 3.5–5.1)
Sodium: 140 mmol/L (ref 135–145)

## 2018-05-14 NOTE — Progress Notes (Signed)
[facility]:   [facility] Room Number: [place] of Service:  SNF (31)   CODE STATUS: DNR  Allergies  Allergen Reactions  . Penicillins Swelling    Chief Complaint  Patient presents with  . Acute Visit    Family concerns    HPI:  Her family is concerned about her abt for her UTI and her needed contact isolation. We have discussed her infection and what to expect with her treatment. We have discussed her increased diuretic and her renal function. She is denying any dysuria; no pubic pain. Her swelling in her legs is slightly improved.    Past Medical History:  Diagnosis Date  . Breast cancer ([place]) 1994   right breast  . CKD (chronic kidney disease)   . Hypertension   . Tachycardia     Past Surgical History:  Procedure Laterality Date  . MASTECTOMY Right 1994    Social History   Socioeconomic History  . Marital status: Married    Spouse name: Not on file  . Number of children: Not on file  . Years of education: Not on file  . Highest education level: Not on file  Occupational History  . Not on file  Social Needs  . Financial resource strain: Not on file  . Food insecurity:    Worry: Not on file    Inability: Not on file  . Transportation needs:    Medical: Not on file    Non-medical: Not on file  Tobacco Use  . Smoking status: Never Smoker  . Smokeless tobacco: Never Used  Substance and Sexual Activity  . Alcohol use: Never    Frequency: Never  . Drug use: Never  . Sexual activity: Not on file  Lifestyle  . Physical activity:    Days per week: Not on file    Minutes per session: Not on file  . Stress: Not on file  Relationships  . Social connections:    Talks on phone: Not on file    Gets together: Not on file    Attends religious service: Not on file    Active member of club or organization: Not on file    Attends meetings of clubs or organizations: Not on file    Relationship status: Not on file  . Intimate  partner violence:    Fear of current or ex partner: Not on file    Emotionally abused: Not on file    Physically abused: Not on file    Forced sexual activity: Not on file  Other Topics Concern  . Not on file  Social History Narrative  . Not on file   History reviewed. No pertinent family history.    VITAL SIGNS BP (!) 113/53   Pulse (!) 58   Temp 98.2 F (36.8 C)   Resp 18   Ht 5\' 1"  (1.549 m)   Wt 196 lb 8 oz (89.1 kg)   SpO2 100%   BMI 37.13 kg/m   Outpatient Encounter Medications as of 05/14/2018  Medication Sig  . amiodarone (PACERONE) 200 MG tablet Take 1 tablet (200 mg total) by mouth daily.  Marland Kitchen apixaban (ELIQUIS) 2.5 MG TABS tablet Take 1 tablet (2.5 mg total) by mouth 2 (two) times daily.  Marland Kitchen aspirin EC 81 MG tablet Take 81 mg by mouth daily.  . budesonide (PULMICORT) 0.5 MG/2ML nebulizer solution Take 2 mLs (0.5 mg total) by nebulization 2 (two) times daily.  . cholecalciferol (VITAMIN D3) 25 MCG (  1000 UT) tablet Take 1,000 Units by mouth daily.  . cyanocobalamin ([employer] VITAMIN B12) 1000 MCG tablet Take 1,000 mcg by mouth daily.   Marland Kitchen diltiazem (CARDIZEM CD) 240 MG 24 hr capsule Take 1 capsule (240 mg total) by mouth daily.  . ferrous sulfate 325 (65 FE) MG tablet Take 325 mg by mouth daily.  Marland Kitchen ipratropium-albuterol (DUONEB) 0.5-2.5 (3) MG/3ML SOLN Take 3 mLs by nebulization every 4 (four) hours as needed (SOB, wheezing).  . metoprolol (TOPROL-XL) 200 MG 24 hr tablet Take 100 mg by mouth 2 (two) times daily.  . Multiple Vitamins-Minerals (HAIR SKIN AND NAILS FORMULA) TABS Take 1 tablet by mouth daily.  . NON FORMULARY Fluid Restriction - 1500 ml / day  Diet Type - NAS  . Nutritional Supplements (ENSURE ENLIVE PO) Give by mouth two times daily between meals  . OXYGEN Inhale 2 L/min into the lungs continuous.  . potassium chloride (K-DUR) 10 MEQ tablet Take 10 mEq by mouth 2 (two) times daily. Starting on 05/13/18 and ending on 05/16/17 then resume the 10 meq daily on  05/17/18  . pravastatin (PRAVACHOL) 40 MG tablet Take 40 mg by mouth every other day.  . Probiotic Product (RISA-BID PROBIOTIC) TABS Take 1 tablet by mouth 3 (three) times daily.  Marland Kitchen sulfamethoxazole-trimethoprim (BACTRIM DS,SEPTRA DS) 800-160 MG tablet Take 1 tablet by mouth 2 (two) times daily.  Marland Kitchen torsemide (DEMADEX) 20 MG tablet Take 20 mg by mouth 2 (two) times daily. From 05/13/18 - 05/16/18 then resume 20 mg daily on 05/17/18  . traZODone (DESYREL) 100 MG tablet Take 1 tablet (100 mg total) by mouth at bedtime as needed for sleep.  . [DISCONTINUED] potassium chloride (K-DUR) 10 MEQ tablet Take 1 tablet (10 mEq total) by mouth every morning. (Patient not taking: Reported on 05/14/2018)   No facility-administered encounter medications on file as of 05/14/2018.      SIGNIFICANT DIAGNOSTIC EXAMS   PREVIOUS;   04-18-18: ct of head/ct maxillofacial:  1. No acute intracranial abnormality 2. Negative for facial fracture.   04-19-18: 2-d echo: Left ventricle: The cavity size was normal. There was moderate oncentric hypertrophy. Systolic function was normal. The estimated ejection fraction was in the range of 55% to 60%.  04-21-18: renal ultrasound: Bilateral renal cysts. No significant or acute finding by ultrasound.  04-26-18: chest x-ray:  1. Cardiomegaly and bilateral effusions may indicate mild CHF. 2. However patchy opacities bilaterally particularly in the right upper lobe are suspicious for superimposed pneumonia and follow-up two-view chest x-ray is recommended.  04-27-18: ct of chest:  1. Small left greater than right pleural effusions with adjacent atelectasis. 2. Patchy airspace opacities in the upper lobes, right greater than left as well as superior segment of right lower lobe concerning for multilobar pneumonia and atelectasis. Followup PA and lateral chest X-ray is recommended in 3-4 weeks following trial of antibiotic therapy to ensure resolution and exclude underlying  malignancy. 3. Mild reactive mediastinal lymphadenopathy. Aortic Atherosclerosis   NO NEW EXAMS.     LABS REVIEWED PREVIOUS:   04-18-18: wbc 11.4; hgb 15.1 hct; 47.1; mcv 88.; plt 280; glucose 129; bun 38; creat 1.21; k+ 3.2; na++ 142; ca 9.6; ast 119; alt 77; total bili 1.4; albumin 4.0 mag 2.0  CK 1767  tsh 1.173  04-20-18: wbc 9.5; hgb 12.2; hct 38.9; mcv 90.5; plt 219; glucose 141; bun 35; creat 1.12 k+ 4.0; na++ 137; ca 8.4; mag 2.4 phos 2.2  CK 307 04-25-18: BNP 1222.0 04-27-18: wbc 10.1;  hgb 10.1; hct 32.7; mcv 91.9; plt 258; glucose 122; bun 35; creat 1.43; k+ 3.7; na++ 138; ca 8.3  04-30-18: wbc 6.2; hgb 9.5; hct 29.1; mcv 92.1; plt 276  05-07-18: urine culture:  E-coli ESBL: septra  05-10-18: wbc 6.7; hgb 10.7; hct 34.9; mcv 92.3; plt 275 sed rate 27  TODAY:   05-14-18: glucose  108; bun 28; creat 1.88;  k+ 3.7; na++ 140 ca 9.0    Review of Systems  Constitutional: Negative for malaise/fatigue.  Respiratory: Negative for cough and shortness of breath.   Cardiovascular: Negative for chest pain, palpitations and leg swelling.  Gastrointestinal: Negative for abdominal pain, constipation and heartburn.  Musculoskeletal: Negative for back pain, joint pain and myalgias.  Skin: Negative.   Neurological: Negative for dizziness.  Psychiatric/Behavioral: The patient is not nervous/anxious.     Physical Exam Constitutional:      General: She is not in acute distress.    Appearance: She is well-developed. She is obese. She is not diaphoretic.  Neck:     Musculoskeletal: Neck supple.     Thyroid: No thyromegaly.  Cardiovascular:     Rate and Rhythm: Normal rate and regular rhythm.     Pulses: Normal pulses.     Heart sounds: Normal heart sounds.     Comments: 1/6 Pulmonary:     Effort: Pulmonary effort is normal. No respiratory distress.     Breath sounds: Normal breath sounds.  Abdominal:     General: Bowel sounds are normal. There is no distension.     Palpations: Abdomen  is soft.     Tenderness: There is no abdominal tenderness.  Musculoskeletal:     Right lower leg: Edema present.     Left lower leg: Edema present.     Comments: Is able to move all extremities Has 3+ bilateral lower extremity edema  History of bilateral knee replacements     Lymphadenopathy:     Cervical: No cervical adenopathy.  Skin:    General: Skin is warm and dry.  Neurological:     Mental Status: She is alert and oriented to person, place, and time.  Psychiatric:        Mood and Affect: Mood normal.      ASSESSMENT/ PLAN:  TODAY:   1. 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 UTI due to extended-spectrum beta lactamase (ESBL) producing Escherichia coli  Will continue her current medications Will continue to monitor her status  Her family has voiced understanding of our discussed and the information given.        MD is aware of resident's narcotic use and is in agreement with current plan of care. We will attempt to wean resident as apropriate   Ok Edwards NP [facility]  Contact [phone] Monday through Friday 8am- 5pm  After hours call [phone]

## 2018-05-15 DIAGNOSIS — N390 Urinary tract infection, site not specified: Principal | ICD-10-CM

## 2018-05-15 DIAGNOSIS — B9629 Other Escherichia coli [E. coli] as the cause of diseases classified elsewhere: Secondary | ICD-10-CM | POA: Insufficient documentation

## 2018-05-15 DIAGNOSIS — Z1612 Extended spectrum beta lactamase (ESBL) resistance: Principal | ICD-10-CM

## 2018-05-16 ENCOUNTER — Non-Acute Institutional Stay (SKILLED_NURSING_FACILITY): Payer: Medicare Other | Admitting: Adult Health

## 2018-05-16 ENCOUNTER — Encounter: Payer: Self-pay | Admitting: Adult Health

## 2018-05-16 DIAGNOSIS — I480 Paroxysmal atrial fibrillation: Secondary | ICD-10-CM | POA: Diagnosis not present

## 2018-05-16 DIAGNOSIS — E1169 Type 2 diabetes mellitus with other specified complication: Secondary | ICD-10-CM

## 2018-05-16 DIAGNOSIS — E785 Hyperlipidemia, unspecified: Secondary | ICD-10-CM

## 2018-05-16 DIAGNOSIS — N184 Chronic kidney disease, stage 4 (severe): Secondary | ICD-10-CM

## 2018-05-16 DIAGNOSIS — J9621 Acute and chronic respiratory failure with hypoxia: Secondary | ICD-10-CM

## 2018-05-16 DIAGNOSIS — D631 Anemia in chronic kidney disease: Secondary | ICD-10-CM

## 2018-05-16 NOTE — Progress Notes (Signed)
[facility]:   [facility] Room Number: [place] of Service:  SNF (31)   CODE STATUS: DNR  Allergies  Allergen Reactions  . Penicillins Swelling    Chief Complaint  Patient presents with  . Medical Management of Chronic Issues    Paroxysmal atrial fibrillation; acute on chronic respiratory failure with hypoxia; dyslipidemia associated with type 2 diabetes mellitus; anemia associated with stage 4 chronic renal failure. Weekly follow up for the first 30 days post hospitalization.     HPI:  She is a 85 year old short term rehab patient being seen for the management for her chronic illnesses: afib; respiratory failure; dyslipidemia; anemia. She denies any cough or shortness of breath. No uncontrolled pain; no changes in appetite.   Past Medical History:  Diagnosis Date  . Breast cancer ([place]) 1994   right breast  . CKD (chronic kidney disease)   . Hypertension   . Tachycardia     Past Surgical History:  Procedure Laterality Date  . MASTECTOMY Right 1994    Social History   Socioeconomic History  . Marital status: Married    Spouse name: Not on file  . Number of children: Not on file  . Years of education: Not on file  . Highest education level: Not on file  Occupational History  . Not on file  Social Needs  . Financial resource strain: Not on file  . Food insecurity:    Worry: Not on file    Inability: Not on file  . Transportation needs:    Medical: Not on file    Non-medical: Not on file  Tobacco Use  . Smoking status: Never Smoker  . Smokeless tobacco: Never Used  Substance and Sexual Activity  . Alcohol use: Never    Frequency: Never  . Drug use: Never  . Sexual activity: Not on file  Lifestyle  . Physical activity:    Days per week: Not on file    Minutes per session: Not on file  . Stress: Not on file  Relationships  . Social connections:    Talks on phone: Not on file    Gets together: Not on file    Attends  religious service: Not on file    Active member of club or organization: Not on file    Attends meetings of clubs or organizations: Not on file    Relationship status: Not on file  . Intimate partner violence:    Fear of current or ex partner: Not on file    Emotionally abused: Not on file    Physically abused: Not on file    Forced sexual activity: Not on file  Other Topics Concern  . Not on file  Social History Narrative  . Not on file   History reviewed. No pertinent family history.    VITAL SIGNS BP (!) 119/49   Pulse 72   Temp 98.2 F (36.8 C)   Resp 18   Ht 5\' 1"  (1.549 m)   Wt 192 lb 9.6 oz (87.4 kg)   SpO2 97%   BMI 36.39 kg/m   Outpatient Encounter Medications as of 05/16/2018  Medication Sig  . amiodarone (PACERONE) 200 MG tablet Take 1 tablet (200 mg total) by mouth daily.  Marland Kitchen apixaban (ELIQUIS) 2.5 MG TABS tablet Take 1 tablet (2.5 mg total) by mouth 2 (two) times daily.  Marland Kitchen aspirin EC 81 MG tablet Take 81 mg by mouth daily.  . budesonide (PULMICORT) 0.5 MG/2ML  nebulizer solution Take 2 mLs (0.5 mg total) by nebulization 2 (two) times daily.  . cholecalciferol (VITAMIN D3) 25 MCG (1000 UT) tablet Take 1,000 Units by mouth daily.  . cyanocobalamin ([employer]S VITAMIN B12) 1000 MCG tablet Take 1,000 mcg by mouth daily.   Marland Kitchen diltiazem (CARDIZEM CD) 240 MG 24 hr capsule Take 1 capsule (240 mg total) by mouth daily.  . ferrous sulfate 325 (65 FE) MG tablet Take 325 mg by mouth daily.  Marland Kitchen ipratropium-albuterol (DUONEB) 0.5-2.5 (3) MG/3ML SOLN Take 3 mLs by nebulization every 4 (four) hours as needed (SOB, wheezing).  . metoprolol (TOPROL-XL) 200 MG 24 hr tablet Take 100 mg by mouth 2 (two) times daily.  . Multiple Vitamins-Minerals (HAIR SKIN AND NAILS FORMULA) TABS Take 1 tablet by mouth daily.  . NON FORMULARY Fluid Restriction - 1500 ml / day  Diet Type - NAS  . Nutritional Supplements (ENSURE ENLIVE PO) Give by mouth two times daily between meals  . OXYGEN Inhale 2 L/min  into the lungs continuous.  . potassium chloride (K-DUR) 10 MEQ tablet Take 10 mEq by mouth 2 (two) times daily. Starting on 05/13/18 and ending on 05/16/17 then resume the 10 meq daily on 05/17/18  . pravastatin (PRAVACHOL) 40 MG tablet Take 40 mg by mouth every other day.  . Probiotic Product (RISA-BID PROBIOTIC) TABS Take 1 tablet by mouth 3 (three) times daily.  Marland Kitchen sulfamethoxazole-trimethoprim (BACTRIM DS,SEPTRA DS) 800-160 MG tablet Take 1 tablet by mouth 2 (two) times daily.  Marland Kitchen torsemide (DEMADEX) 20 MG tablet Take 20 mg by mouth 2 (two) times daily. From 05/13/18 - 05/16/18 then resume 20 mg daily on 05/17/18  . traZODone (DESYREL) 100 MG tablet Take 1 tablet (100 mg total) by mouth at bedtime as needed for sleep.   No facility-administered encounter medications on file as of 05/16/2018.      SIGNIFICANT DIAGNOSTIC EXAMS    PREVIOUS;   04-18-18: ct of head/ct maxillofacial:  1. No acute intracranial abnormality 2. Negative for facial fracture.   04-19-18: 2-d echo: Left ventricle: The cavity size was normal. There was moderate oncentric hypertrophy. Systolic function was normal. The estimated ejection fraction was in the range of 55% to 60%.  04-21-18: renal ultrasound: Bilateral renal cysts. No significant or acute finding by ultrasound.  04-26-18: chest x-ray:  1. Cardiomegaly and bilateral effusions may indicate mild CHF. 2. However patchy opacities bilaterally particularly in the right upper lobe are suspicious for superimposed pneumonia and follow-up two-view chest x-ray is recommended.  04-27-18: ct of chest:  1. Small left greater than right pleural effusions with adjacent atelectasis. 2. Patchy airspace opacities in the upper lobes, right greater than left as well as superior segment of right lower lobe concerning for multilobar pneumonia and atelectasis. Followup PA and lateral chest X-ray is recommended in 3-4 weeks following trial of antibiotic therapy to ensure  resolution and exclude underlying malignancy. 3. Mild reactive mediastinal lymphadenopathy. Aortic Atherosclerosis   NO NEW EXAMS.     LABS REVIEWED PREVIOUS:   04-18-18: wbc 11.4; hgb 15.1 hct; 47.1; mcv 88.; plt 280; glucose 129; bun 38; creat 1.21; k+ 3.2; na++ 142; ca 9.6; ast 119; alt 77; total bili 1.4; albumin 4.0 mag 2.0  CK 1767  tsh 1.173  04-20-18: wbc 9.5; hgb 12.2; hct 38.9; mcv 90.5; plt 219; glucose 141; bun 35; creat 1.12 k+ 4.0; na++ 137; ca 8.4; mag 2.4 phos 2.2  CK 307 04-25-18: BNP 1222.0 04-27-18: wbc 10.1; hgb 10.1; hct  32.7; mcv 91.9; plt 258; glucose 122; bun 35; creat 1.43; k+ 3.7; na++ 138; ca 8.3  04-30-18: wbc 6.2; hgb 9.5; hct 29.1; mcv 92.1; plt 276  05-07-18: urine culture:  E-coli ESBL: septra  05-10-18: wbc 6.7; hgb 10.7; hct 34.9; mcv 92.3; plt 275 sed rate 27 05-14-18: glucose  108; bun 28; creat 1.88;  k+ 3.7; na++ 140 ca 9.0   NO NEW LABS.   Review of Systems  Constitutional: Negative for malaise/fatigue.  Respiratory: Negative for cough and shortness of breath.   Cardiovascular: Negative for chest pain, palpitations and leg swelling.  Gastrointestinal: Negative for abdominal pain, constipation and heartburn.  Musculoskeletal: Negative for back pain, joint pain and myalgias.  Skin: Negative.   Neurological: Negative for dizziness.  Psychiatric/Behavioral: The patient is not nervous/anxious.     Physical Exam Constitutional:      General: She is not in acute distress.    Appearance: She is well-developed. She is obese. She is not diaphoretic.  Neck:     Musculoskeletal: Neck supple.     Thyroid: No thyromegaly.  Cardiovascular:     Rate and Rhythm: Normal rate and regular rhythm.     Pulses: Normal pulses.     Heart sounds: Murmur present.     Comments: 1/6 Pulmonary:     Effort: Pulmonary effort is normal. No respiratory distress.     Breath sounds: Normal breath sounds.  Abdominal:     General: Bowel sounds are normal. There is no  distension.     Palpations: Abdomen is soft.     Tenderness: There is no abdominal tenderness.  Musculoskeletal:     Right lower leg: Edema present.     Left lower leg: Edema present.     Comments: Is able to move all extremities Has 2-3+ bilateral lower extremity edema with left worse than right  History of bilateral knee replacements      Lymphadenopathy:     Cervical: No cervical adenopathy.  Skin:    General: Skin is warm and dry.     Comments: Right mastectomy 1994  Neurological:     Mental Status: She is alert and oriented to person, place, and time.      ASSESSMENT/ PLAN:  TODAY:   1. Paroxysmal afib heart rate is stable: will continue toprol xl 100 mg twice daily; cardizem cd 240 mg daily and amiodarone 200 mg daily for rate control will continue eliquis 2.5 mg twice daily and asa 81 mg daily   2. Dyslipidemia associated with type 2 diabetes mellitus: is stable will continue pravachol 40 mg daily   3. Anemia associated with stage 4 chronic renal failure: is stable hgb 9.5; will continue iron daily   4. Acute on chronic respiratory failure with hypoxia: is without change will continue 02 at 2L pulmicort neb twice daily duoneb every 4 hours as needed  PREVIOUS  5.  Type 2 diabetes mellitus stage 4 chronic kidney disease and hypertension: is stable hgb a1c 5.7. will not make changes and will monitor  6. Stage 4 chronic renal impairment associated with type 2 diabetes mellitus: is stable bun 28; creat 1.  7. Hypertensive heart and kidney disease with chronic diastolic congestive failure and stage 4 chronic kidney disease: is stable b/p 114/49: will continue toprol xl 100 mg twice daily and apresoline 50 mg every 8 hours  8. Acute on chronic diastolic heart failure: is stable EF 55-60% (04-19-18); will continue toprol xl 100 mg twice daily apresoline 50 mg  every 8 hours will increase lasix to 40 mg daily and will continue k+ 10 meq daily  Is on 1500 cc fluid restriction    9. UTI: is stable will complete abt and will monitor he status.     MD is aware of resident's narcotic use and is in agreement with current plan of care. We will attempt to wean resident as apropriate   Ok Edwards NP [facility]  Contact [phone] Monday through Friday 8am- 5pm  After hours call [phone]

## 2018-05-20 ENCOUNTER — Encounter: Payer: Self-pay | Admitting: Adult Health

## 2018-05-20 ENCOUNTER — Non-Acute Institutional Stay (SKILLED_NURSING_FACILITY): Payer: Medicare Other | Admitting: Adult Health

## 2018-05-20 DIAGNOSIS — J189 Pneumonia, unspecified organism: Secondary | ICD-10-CM

## 2018-05-20 DIAGNOSIS — J9621 Acute and chronic respiratory failure with hypoxia: Secondary | ICD-10-CM | POA: Diagnosis not present

## 2018-05-20 DIAGNOSIS — I5033 Acute on chronic diastolic (congestive) heart failure: Secondary | ICD-10-CM | POA: Diagnosis not present

## 2018-05-20 NOTE — Progress Notes (Signed)
Location:   [facility] Room Number: [place] of Service:  SNF (31)   CODE STATUS: DNR  Allergies  Allergen Reactions  . Penicillins Swelling    Chief Complaint  Patient presents with  . Acute Visit    Weight Gain    HPI:  She has gained weight from 187 pounds to her current weight of 195 pounds. She denies any shortness of breath; does have cough; and lower extremity edema. There are no reports of fevers present. She does have chronic diastolic heart failure and chronic respiratory failure with hypoxia.    Past Medical History:  Diagnosis Date  . Breast cancer ([place]) 1994   right breast  . CKD (chronic kidney disease)   . Hypertension   . Tachycardia     Past Surgical History:  Procedure Laterality Date  . MASTECTOMY Right 1994    Social History   Socioeconomic History  . Marital status: Married    Spouse name: Not on file  . Number of children: Not on file  . Years of education: Not on file  . Highest education level: Not on file  Occupational History  . Not on file  Social Needs  . Financial resource strain: Not on file  . Food insecurity:    Worry: Not on file    Inability: Not on file  . Transportation needs:    Medical: Not on file    Non-medical: Not on file  Tobacco Use  . Smoking status: Never Smoker  . Smokeless tobacco: Never Used  Substance and Sexual Activity  . Alcohol use: Never    Frequency: Never  . Drug use: Never  . Sexual activity: Not on file  Lifestyle  . Physical activity:    Days per week: Not on file    Minutes per session: Not on file  . Stress: Not on file  Relationships  . Social connections:    Talks on phone: Not on file    Gets together: Not on file    Attends religious service: Not on file    Active member of club or organization: Not on file    Attends meetings of clubs or organizations: Not on file    Relationship status: Not on file  . Intimate partner violence:    Fear of  current or ex partner: Not on file    Emotionally abused: Not on file    Physically abused: Not on file    Forced sexual activity: Not on file  Other Topics Concern  . Not on file  Social History Narrative  . Not on file   History reviewed. No pertinent family history.    VITAL SIGNS BP (!) 129/45   Pulse 62   Temp 98.2 F (36.8 C)   Resp 16   Ht 5\' 1"  (1.549 m)   Wt 195 lb 9.6 oz (88.7 kg)   SpO2 100%   BMI 36.96 kg/m   Outpatient Encounter Medications as of 05/20/2018  Medication Sig  . amiodarone (PACERONE) 200 MG tablet Take 1 tablet (200 mg total) by mouth daily.  Marland Kitchen apixaban (ELIQUIS) 2.5 MG TABS tablet Take 1 tablet (2.5 mg total) by mouth 2 (two) times daily.  Marland Kitchen aspirin EC 81 MG tablet Take 81 mg by mouth daily.  . budesonide (PULMICORT) 0.5 MG/2ML nebulizer solution Take 2 mLs (0.5 mg total) by nebulization 2 (two) times daily.  . cholecalciferol (VITAMIN D3) 25 MCG (1000 UT) tablet Take 1,000 Units by  mouth daily.  . cyanocobalamin ([employer] VITAMIN B12) 1000 MCG tablet Take 1,000 mcg by mouth daily.   Marland Kitchen diltiazem (CARDIZEM CD) 240 MG 24 hr capsule Take 1 capsule (240 mg total) by mouth daily.  . ferrous sulfate 325 (65 FE) MG tablet Take 325 mg by mouth daily.  Marland Kitchen ipratropium-albuterol (DUONEB) 0.5-2.5 (3) MG/3ML SOLN Take 3 mLs by nebulization every 4 (four) hours as needed (SOB, wheezing).  . metoprolol (TOPROL-XL) 200 MG 24 hr tablet Take 100 mg by mouth 2 (two) times daily.  . Multiple Vitamins-Minerals (HAIR SKIN AND NAILS FORMULA) TABS Take 1 tablet by mouth daily.  . NON FORMULARY Fluid Restriction - 1500 ml / day  Diet Type - NAS  . Nutritional Supplements (ENSURE ENLIVE PO) Give by mouth two times daily between meals  . OXYGEN Inhale 2 L/min into the lungs continuous.  . potassium chloride (K-DUR) 10 MEQ tablet Take 10 mEq by mouth daily.   . pravastatin (PRAVACHOL) 40 MG tablet Take 40 mg by mouth every other day.  . Probiotic Product (RISA-BID PROBIOTIC)  TABS Take 1 tablet by mouth 3 (three) times daily.  Marland Kitchen sulfamethoxazole-trimethoprim (BACTRIM DS,SEPTRA DS) 800-160 MG tablet Take 1 tablet by mouth 2 (two) times daily.  Marland Kitchen torsemide (DEMADEX) 20 MG tablet Take 20 mg by mouth daily.   . traZODone (DESYREL) 100 MG tablet Take 1 tablet (100 mg total) by mouth at bedtime as needed for sleep.   No facility-administered encounter medications on file as of 05/20/2018.      SIGNIFICANT DIAGNOSTIC EXAMS   PREVIOUS;   04-18-18: ct of head/ct maxillofacial:  1. No acute intracranial abnormality 2. Negative for facial fracture.   04-19-18: 2-d echo: Left ventricle: The cavity size was normal. There was moderate oncentric hypertrophy. Systolic function was normal. The estimated ejection fraction was in the range of 55% to 60%.  04-21-18: renal ultrasound: Bilateral renal cysts. No significant or acute finding by ultrasound.  04-26-18: chest x-ray:  1. Cardiomegaly and bilateral effusions may indicate mild CHF. 2. However patchy opacities bilaterally particularly in the right upper lobe are suspicious for superimposed pneumonia and follow-up two-view chest x-ray is recommended.  04-27-18: ct of chest:  1. Small left greater than right pleural effusions with adjacent atelectasis. 2. Patchy airspace opacities in the upper lobes, right greater than left as well as superior segment of right lower lobe concerning for multilobar pneumonia and atelectasis. Followup PA and lateral chest X-ray is recommended in 3-4 weeks following trial of antibiotic therapy to ensure resolution and exclude underlying malignancy. 3. Mild reactive mediastinal lymphadenopathy. Aortic Atherosclerosis   NO NEW EXAMS.     LABS REVIEWED PREVIOUS:   04-18-18: wbc 11.4; hgb 15.1 hct; 47.1; mcv 88.; plt 280; glucose 129; bun 38; creat 1.21; k+ 3.2; na++ 142; ca 9.6; ast 119; alt 77; total bili 1.4; albumin 4.0 mag 2.0  CK 1767  tsh 1.173  04-20-18: wbc 9.5; hgb 12.2; hct  38.9; mcv 90.5; plt 219; glucose 141; bun 35; creat 1.12 k+ 4.0; na++ 137; ca 8.4; mag 2.4 phos 2.2  CK 307 04-25-18: BNP 1222.0 04-27-18: wbc 10.1; hgb 10.1; hct 32.7; mcv 91.9; plt 258; glucose 122; bun 35; creat 1.43; k+ 3.7; na++ 138; ca 8.3  04-30-18: wbc 6.2; hgb 9.5; hct 29.1; mcv 92.1; plt 276  05-07-18: urine culture:  E-coli ESBL: septra  05-10-18: wbc 6.7; hgb 10.7; hct 34.9; mcv 92.3; plt 275 sed rate 27 05-14-18: glucose  108; bun 28; creat  1.88;  k+ 3.7; na++ 140 ca 9.0   NO NEW LABS.   Review of Systems  Constitutional: Negative for malaise/fatigue.  Respiratory: Positive for cough. Negative for shortness of breath.   Cardiovascular: Positive for leg swelling. Negative for chest pain and palpitations.  Gastrointestinal: Negative for abdominal pain, constipation and heartburn.  Musculoskeletal: Negative for back pain, joint pain and myalgias.  Skin: Negative.   Neurological: Negative for dizziness.  Psychiatric/Behavioral: The patient is not nervous/anxious.      Physical Exam Constitutional:      General: She is not in acute distress.    Appearance: She is well-developed. She is obese. She is not diaphoretic.  Neck:     Musculoskeletal: Neck supple.     Thyroid: No thyromegaly.  Cardiovascular:     Rate and Rhythm: Normal rate and regular rhythm.     Pulses: Normal pulses.     Heart sounds: Murmur present.     Comments: 1/6 Pulmonary:     Effort: No respiratory distress.     Breath sounds: Wheezing and rhonchi present.     Comments: Right lower lobe Abdominal:     General: Bowel sounds are normal. There is no distension.     Palpations: Abdomen is soft.     Tenderness: There is no abdominal tenderness.  Musculoskeletal:     Right lower leg: Edema present.     Left lower leg: Edema present.     Comments: Is able to move all extremities Has 2-3+ bilateral lower extremity edema with left worse than right  History of bilateral knee replacements        Lymphadenopathy:     Cervical: No cervical adenopathy.  Skin:    General: Skin is warm and dry.     Comments: Right mastectomy 1994   Neurological:     Mental Status: She is alert and oriented to person, place, and time.  Psychiatric:        Mood and Affect: Mood normal.       ASSESSMENT/ PLAN:  TODAY:   1. Acute on chronic diastolic heart failure 2. Acute on chronic respiratory failure with hypoxia 3. HCAP:   Is worse: will increase demadex to 20 mg twice daily Will begin avelox 400 mg daily through 05-30-18        MD is aware of resident's narcotic use and is in agreement with current plan of care. We will attempt to wean resident as apropriate   Ok Edwards NP [facility]  Contact [phone] Monday through Friday 8am- 5pm  After hours call [phone]

## 2018-05-23 ENCOUNTER — Encounter: Payer: Self-pay | Admitting: Adult Health

## 2018-05-23 ENCOUNTER — Non-Acute Institutional Stay (SKILLED_NURSING_FACILITY): Payer: Medicare Other | Admitting: Adult Health

## 2018-05-23 DIAGNOSIS — N184 Chronic kidney disease, stage 4 (severe): Secondary | ICD-10-CM

## 2018-05-23 DIAGNOSIS — I5033 Acute on chronic diastolic (congestive) heart failure: Secondary | ICD-10-CM

## 2018-05-23 DIAGNOSIS — J189 Pneumonia, unspecified organism: Secondary | ICD-10-CM | POA: Insufficient documentation

## 2018-05-23 DIAGNOSIS — I480 Paroxysmal atrial fibrillation: Secondary | ICD-10-CM | POA: Diagnosis not present

## 2018-05-23 DIAGNOSIS — I130 Hypertensive heart and chronic kidney disease with heart failure and stage 1 through stage 4 chronic kidney disease, or unspecified chronic kidney disease: Secondary | ICD-10-CM | POA: Diagnosis not present

## 2018-05-23 DIAGNOSIS — I5032 Chronic diastolic (congestive) heart failure: Secondary | ICD-10-CM

## 2018-05-23 NOTE — Progress Notes (Signed)
Location:   [place] Room Number: 2 Place of Service:  SNF (31)   CODE STATUS: DNR  Allergies  Allergen Reactions  . Penicillins Swelling    Chief Complaint  Patient presents with  . Medical Management of Chronic Issues    Hypertensive heart and kidney disease with chronic diastolic congestive heart failure and stage 4 chronic kidney disease; paroxysmal atrial fibrillation; acute on chronic diastolic heart failure. Weekly follow up for the first 30 days post hospitalization.     HPI:  She is a 85 year old short term rehab patient being seen for the management of her chronic illnesses; hypertensive heart disease; chf; afib. She is presently being treated for pneumonia. She has lost fluid weight and is down to 183 pounds. She denies any chest pain or shortness of breath. She denies any changes in appetite; no anxiety.   Past Medical History:  Diagnosis Date  . Breast cancer ([place]) 1994   right breast  . CKD (chronic kidney disease)   . Hypertension   . Tachycardia     Past Surgical History:  Procedure Laterality Date  . MASTECTOMY Right 1994    Social History   Socioeconomic History  . Marital status: Married    Spouse name: Not on file  . Number of children: Not on file  . Years of education: Not on file  . Highest education level: Not on file  Occupational History  . Not on file  Social Needs  . Financial resource strain: Not on file  . Food insecurity:    Worry: Not on file    Inability: Not on file  . Transportation needs:    Medical: Not on file    Non-medical: Not on file  Tobacco Use  . Smoking status: Never Smoker  . Smokeless tobacco: Never Used  Substance and Sexual Activity  . Alcohol use: Never    Frequency: Never  . Drug use: Never  . Sexual activity: Not on file  Lifestyle  . Physical activity:    Days per week: Not on file    Minutes per session: Not on file  . Stress: Not on file  Relationships  . Social connections:    Talks on phone: Not on file    Gets together: Not on file    Attends religious service: Not on file    Active member of club or organization: Not on file    Attends meetings of clubs or organizations: Not on file    Relationship status: Not on file  . Intimate partner violence:    Fear of current or ex partner: Not on file    Emotionally abused: Not on file    Physically abused: Not on file    Forced sexual activity: Not on file  Other Topics Concern  . Not on file  Social History Narrative  . Not on file   History reviewed. No pertinent family history.    VITAL SIGNS BP (!) 110/51   Pulse 69   Temp 98.2 F (36.8 C)   Resp 18   Ht 5\' 1"  (1.549 m)   Wt 183 lb 11.2 oz (83.3 kg)   SpO2 98%   BMI 34.71 kg/m   Outpatient Encounter Medications as of 05/23/2018  Medication Sig  . amiodarone (PACERONE) 200 MG tablet Take 1 tablet (200 mg total) by mouth daily.  Marland Kitchen apixaban (ELIQUIS) 2.5 MG TABS tablet Take 1 tablet (2.5 mg total) by mouth 2 (two) times daily.  Marland Kitchen  aspirin EC 81 MG tablet Take 81 mg by mouth daily.  . budesonide (PULMICORT) 0.5 MG/2ML nebulizer solution Take 2 mLs (0.5 mg total) by nebulization 2 (two) times daily.  . cholecalciferol (VITAMIN D3) 25 MCG (1000 UT) tablet Take 1,000 Units by mouth daily.  . cyanocobalamin ([employer]S VITAMIN B12) 1000 MCG tablet Take 1,000 mcg by mouth daily.   Marland Kitchen diltiazem (CARDIZEM CD) 240 MG 24 hr capsule Take 1 capsule (240 mg total) by mouth daily.  . ferrous sulfate 325 (65 FE) MG tablet Take 325 mg by mouth daily.  Marland Kitchen ipratropium-albuterol (DUONEB) 0.5-2.5 (3) MG/3ML SOLN Take 3 mLs by nebulization every 4 (four) hours as needed (SOB, wheezing).  . metoprolol (TOPROL-XL) 200 MG 24 hr tablet Take 100 mg by mouth 2 (two) times daily.  Marland Kitchen moxifloxacin (AVELOX) 400 MG tablet Take 400 mg by mouth daily at 8 pm.  . Multiple Vitamins-Minerals (HAIR SKIN AND NAILS FORMULA) TABS Take 1 tablet by mouth daily.  . NON FORMULARY Fluid Restriction -  1500 ml / day  Diet Type - NAS  . Nutritional Supplements (ENSURE ENLIVE PO) Give by mouth two times daily between meals  . OXYGEN Inhale 2 L/min into the lungs continuous.  . potassium chloride (K-DUR) 10 MEQ tablet Take 10 mEq by mouth daily.   . pravastatin (PRAVACHOL) 40 MG tablet Take 40 mg by mouth every other day.  . Probiotic Product (RISA-BID PROBIOTIC) TABS Take 1 tablet by mouth 3 (three) times daily.  Marland Kitchen torsemide (DEMADEX) 20 MG tablet Take 20 mg by mouth twice daily.   . traZODone (DESYREL) 100 MG tablet Take 1 tablet (100 mg total) by mouth at bedtime as needed for sleep.   No facility-administered encounter medications on file as of 05/23/2018.      SIGNIFICANT DIAGNOSTIC EXAMS   PREVIOUS;   04-18-18: ct of head/ct maxillofacial:  1. No acute intracranial abnormality 2. Negative for facial fracture.   04-19-18: 2-d echo: Left ventricle: The cavity size was normal. There was moderate oncentric hypertrophy. Systolic function was normal. The estimated ejection fraction was in the range of 55% to 60%.  04-21-18: renal ultrasound: Bilateral renal cysts. No significant or acute finding by ultrasound.  04-26-18: chest x-ray:  1. Cardiomegaly and bilateral effusions may indicate mild CHF. 2. However patchy opacities bilaterally particularly in the right upper lobe are suspicious for superimposed pneumonia and follow-up two-view chest x-ray is recommended.  04-27-18: ct of chest:  1. Small left greater than right pleural effusions with adjacent atelectasis. 2. Patchy airspace opacities in the upper lobes, right greater than left as well as superior segment of right lower lobe concerning for multilobar pneumonia and atelectasis. Followup PA and lateral chest X-ray is recommended in 3-4 weeks following trial of antibiotic therapy to ensure resolution and exclude underlying malignancy. 3. Mild reactive mediastinal lymphadenopathy. Aortic Atherosclerosis   NO NEW EXAMS.      LABS REVIEWED PREVIOUS:   04-18-18: wbc 11.4; hgb 15.1 hct; 47.1; mcv 88.; plt 280; glucose 129; bun 38; creat 1.21; k+ 3.2; na++ 142; ca 9.6; ast 119; alt 77; total bili 1.4; albumin 4.0 mag 2.0  CK 1767  tsh 1.173  04-20-18: wbc 9.5; hgb 12.2; hct 38.9; mcv 90.5; plt 219; glucose 141; bun 35; creat 1.12 k+ 4.0; na++ 137; ca 8.4; mag 2.4 phos 2.2  CK 307 04-25-18: BNP 1222.0 04-27-18: wbc 10.1; hgb 10.1; hct 32.7; mcv 91.9; plt 258; glucose 122; bun 35; creat 1.43; k+ 3.7; na++ 138;  ca 8.3  04-30-18: wbc 6.2; hgb 9.5; hct 29.1; mcv 92.1; plt 276  05-07-18: urine culture:  E-coli ESBL: septra  05-10-18: wbc 6.7; hgb 10.7; hct 34.9; mcv 92.3; plt 275 sed rate 27 05-14-18: glucose  108; bun 28; creat 1.88;  k+ 3.7; na++ 140 ca 9.0   NO NEW LABS.   Review of Systems  Constitutional: Negative for malaise/fatigue.  Respiratory: Negative for cough and shortness of breath.   Cardiovascular: Negative for chest pain, palpitations and leg swelling.  Gastrointestinal: Negative for abdominal pain, constipation and heartburn.  Musculoskeletal: Negative for back pain, joint pain and myalgias.  Skin: Negative.   Neurological: Negative for dizziness.  Psychiatric/Behavioral: The patient is not nervous/anxious.     Physical Exam Constitutional:      General: She is not in acute distress.    Appearance: She is well-developed. She is obese. She is not diaphoretic.  Neck:     Musculoskeletal: Neck supple.     Thyroid: No thyromegaly.  Cardiovascular:     Rate and Rhythm: Normal rate and regular rhythm.     Pulses: Normal pulses.     Heart sounds: Murmur present.     Comments: 1/6 Pulmonary:     Effort: Pulmonary effort is normal. No respiratory distress.     Breath sounds: Normal breath sounds.     Comments: 02 dependent  Abdominal:     General: Bowel sounds are normal. There is no distension.     Palpations: Abdomen is soft.     Tenderness: There is no abdominal tenderness.   Musculoskeletal:     Right lower leg: Edema present.     Left lower leg: Edema present.     Comments:  Is able to move all extremities Has 2+ bilateral lower extremity edema with left worse than right  History of bilateral knee replacements     Lymphadenopathy:     Cervical: No cervical adenopathy.  Skin:    General: Skin is warm and dry.     Comments:  Right mastectomy 1994    Neurological:     Mental Status: She is alert and oriented to person, place, and time.  Psychiatric:        Mood and Affect: Mood normal.       ASSESSMENT/ PLAN:   TODAY:   1. Paroxysmal afib heart rate is stable: will continue  cardizem cd 240 mg daily and amiodarone 200 mg daily for rate control will continue eliquis 2.5 mg twice daily and asa 81 mg daily  Will lower toprol xl to 100 mg daily   2. Hypertensive heart and kidney disease with chronic diastolic congestive failure and stage 4 chronic kidney disease: is stable b/p 110/51: will lower to toprol xl 100 mg daily due to her soft blood pressure readings.   3. Acute on chronic diastolic heart failure: is stable EF 55-60% (04-19-18);  Is on 1500 cc fluid restriction will continue toprol xl 100 mg twice daily and demadex 20 mg twice daily   4. HCAP: is stable will complete avelox 400 mg daily and will monitor her status.   PREVIOUS  5.  Type 2 diabetes mellitus stage 4 chronic kidney disease and hypertension: is stable hgb a1c 5.7. will not make changes and will monitor  6. Stage 4 chronic renal impairment associated with type 2 diabetes mellitus: is stable bun 28; creat 1.  7. Dyslipidemia associated with type 2 diabetes mellitus: is stable will continue pravachol 40 mg daily   8.  Anemia associated with stage 4 chronic renal failure: is stable hgb 9.5; will continue iron daily   9. Acute on chronic respiratory failure with hypoxia: is without change will continue 02 at 2L pulmicort neb twice daily duoneb every 4 hours as needed   MD is aware  of resident's narcotic use and is in agreement with current plan of care. We will attempt to wean resident as apropriate   Ok Edwards NP [facility]  Contact [phone] Monday through Friday 8am- 5pm  After hours call [phone]

## 2018-05-28 ENCOUNTER — Non-Acute Institutional Stay (SKILLED_NURSING_FACILITY): Payer: Medicare Other | Admitting: Adult Health

## 2018-05-28 ENCOUNTER — Encounter: Payer: Self-pay | Admitting: Adult Health

## 2018-05-28 DIAGNOSIS — N184 Chronic kidney disease, stage 4 (severe): Secondary | ICD-10-CM | POA: Diagnosis not present

## 2018-05-28 DIAGNOSIS — E1122 Type 2 diabetes mellitus with diabetic chronic kidney disease: Secondary | ICD-10-CM | POA: Diagnosis not present

## 2018-05-28 DIAGNOSIS — I5033 Acute on chronic diastolic (congestive) heart failure: Secondary | ICD-10-CM | POA: Diagnosis not present

## 2018-05-28 DIAGNOSIS — J9621 Acute and chronic respiratory failure with hypoxia: Secondary | ICD-10-CM

## 2018-05-28 NOTE — Progress Notes (Signed)
Location:   [facility] Room Number: [place] of Service:  SNF (31)    CODE STATUS: DNR  Allergies  Allergen Reactions  . Penicillins Swelling    Chief Complaint  Patient presents with  . Discharge Note    Discharging to ALF on 05/30/2018    HPI:  She is being discharged to assisted living with home health for pt/ot/rn. She will need 02 at 2L/[place]. Her medications will be providing her medications. She will need to follow up with the medical provider at the receiving facility She had been hospitalized status post fall for rhabdomyolysis; acute on chronic respiratory failure. She was admitted to this facility for short term rehab. She has completed rehab at a skilled nursing level is not ready to return home; will go to assisted living. She requires use of continuous 02 her sats on 2L was 98% with activity on room air was 86%.    Past Medical History:  Diagnosis Date  . Breast cancer ([place]) 1994   right breast  . CKD (chronic kidney disease)   . Hypertension   . Tachycardia     Past Surgical History:  Procedure Laterality Date  . MASTECTOMY Right 1994    Social History   Socioeconomic History  . Marital status: Married    Spouse name: Not on file  . Number of children: Not on file  . Years of education: Not on file  . Highest education level: Not on file  Occupational History  . Not on file  Social Needs  . Financial resource strain: Not on file  . Food insecurity:    Worry: Not on file    Inability: Not on file  . Transportation needs:    Medical: Not on file    Non-medical: Not on file  Tobacco Use  . Smoking status: Never Smoker  . Smokeless tobacco: Never Used  Substance and Sexual Activity  . Alcohol use: Never    Frequency: Never  . Drug use: Never  . Sexual activity: Not on file  Lifestyle  . Physical activity:    Days per week: Not on file    Minutes per session: Not on file  . Stress: Not on file  Relationships    . Social connections:    Talks on phone: Not on file    Gets together: Not on file    Attends religious service: Not on file    Active member of club or organization: Not on file    Attends meetings of clubs or organizations: Not on file    Relationship status: Not on file  . Intimate partner violence:    Fear of current or ex partner: Not on file    Emotionally abused: Not on file    Physically abused: Not on file    Forced sexual activity: Not on file  Other Topics Concern  . Not on file  Social History Narrative  . Not on file   History reviewed. No pertinent family history.  VITAL SIGNS BP (!) 141/63   Pulse 61   Temp 97.9 F (36.6 C)   Resp 18   Ht 5\' 1"  (1.549 m)   Wt 188 lb 6.4 oz (85.5 kg)   SpO2 98%   BMI 35.60 kg/m   Patient's Medications  New Prescriptions   No medications on file  Previous Medications   AMIODARONE (PACERONE) 200 MG TABLET    Take 1 tablet (200 mg total) by mouth daily.  APIXABAN (ELIQUIS) 2.5 MG TABS TABLET    Take 1 tablet (2.5 mg total) by mouth 2 (two) times daily.   ASPIRIN EC 81 MG TABLET    Take 81 mg by mouth daily.   BUDESONIDE (PULMICORT) 0.5 MG/2ML NEBULIZER SOLUTION    Take 2 mLs (0.5 mg total) by nebulization 2 (two) times daily.   CHOLECALCIFEROL (VITAMIN D3) 25 MCG (1000 UT) TABLET    Take 1,000 Units by mouth daily.   CYANOCOBALAMIN ([employer] VITAMIN B12) 1000 MCG TABLET    Take 1,000 mcg by mouth daily.    DILTIAZEM (CARDIZEM CD) 240 MG 24 HR CAPSULE    Take 1 capsule (240 mg total) by mouth daily.   FERROUS SULFATE 325 (65 FE) MG TABLET    Take 325 mg by mouth daily.   IPRATROPIUM-ALBUTEROL (DUONEB) 0.5-2.5 (3) MG/3ML SOLN    Take 3 mLs by nebulization every 4 (four) hours as needed (SOB, wheezing).   METOPROLOL (TOPROL-XL) 200 MG 24 HR TABLET    Take 100 mg by mouth 2 (two) times daily.   MOXIFLOXACIN (AVELOX) 400 MG TABLET    Take 400 mg by mouth daily at 8 pm.   MULTIPLE VITAMINS-MINERALS (HAIR SKIN AND NAILS FORMULA) TABS     Take 1 tablet by mouth daily.   NON FORMULARY    Fluid Restriction - 1500 ml / day  Diet Type - NAS   NUTRITIONAL SUPPLEMENTS (ENSURE ENLIVE PO)    Give by mouth two times daily between meals   OXYGEN    Inhale 2 L/min into the lungs continuous.   POTASSIUM CHLORIDE (K-DUR) 10 MEQ TABLET    Take 10 mEq by mouth daily.    PRAVASTATIN (PRAVACHOL) 40 MG TABLET    Take 40 mg by mouth every other day.   TORSEMIDE (DEMADEX) 20 MG TABLET    Take 20 mg by mouth 2 (two) times daily.    TRAZODONE (DESYREL) 100 MG TABLET    Take 1 tablet (100 mg total) by mouth at bedtime as needed for sleep.  Modified Medications   No medications on file  Discontinued Medications   No medications on file     SIGNIFICANT DIAGNOSTIC EXAMS  PREVIOUS;   04-18-18: ct of head/ct maxillofacial:  1. No acute intracranial abnormality 2. Negative for facial fracture.   04-19-18: 2-d echo: Left ventricle: The cavity size was normal. There was moderate oncentric hypertrophy. Systolic function was normal. The estimated ejection fraction was in the range of 55% to 60%.  04-21-18: renal ultrasound: Bilateral renal cysts. No significant or acute finding by ultrasound.  04-26-18: chest x-ray:  1. Cardiomegaly and bilateral effusions may indicate mild CHF. 2. However patchy opacities bilaterally particularly in the right upper lobe are suspicious for superimposed pneumonia and follow-up two-view chest x-ray is recommended.  04-27-18: ct of chest:  1. Small left greater than right pleural effusions with adjacent atelectasis. 2. Patchy airspace opacities in the upper lobes, right greater than left as well as superior segment of right lower lobe concerning for multilobar pneumonia and atelectasis. Followup PA and lateral chest X-ray is recommended in 3-4 weeks following trial of antibiotic therapy to ensure resolution and exclude underlying malignancy. 3. Mild reactive mediastinal lymphadenopathy. Aortic Atherosclerosis    NO NEW EXAMS.     LABS REVIEWED PREVIOUS:   04-18-18: wbc 11.4; hgb 15.1 hct; 47.1; mcv 88.; plt 280; glucose 129; bun 38; creat 1.21; k+ 3.2; na++ 142; ca 9.6; ast 119; alt 77; total bili 1.4;  albumin 4.0 mag 2.0  CK 1767  tsh 1.173  04-20-18: wbc 9.5; hgb 12.2; hct 38.9; mcv 90.5; plt 219; glucose 141; bun 35; creat 1.12 k+ 4.0; na++ 137; ca 8.4; mag 2.4 phos 2.2  CK 307 04-25-18: BNP 1222.0 04-27-18: wbc 10.1; hgb 10.1; hct 32.7; mcv 91.9; plt 258; glucose 122; bun 35; creat 1.43; k+ 3.7; na++ 138; ca 8.3  04-30-18: wbc 6.2; hgb 9.5; hct 29.1; mcv 92.1; plt 276  05-07-18: urine culture:  E-coli ESBL: septra  05-10-18: wbc 6.7; hgb 10.7; hct 34.9; mcv 92.3; plt 275 sed rate 27 05-14-18: glucose  108; bun 28; creat 1.88;  k+ 3.7; na++ 140 ca 9.0   NO NEW LABS.   Review of Systems  Constitutional: Negative for malaise/fatigue.  Respiratory: Negative for cough and shortness of breath.   Cardiovascular: Negative for chest pain, palpitations and leg swelling.  Gastrointestinal: Negative for abdominal pain, constipation and heartburn.  Musculoskeletal: Negative for back pain, joint pain and myalgias.  Skin: Negative.   Neurological: Negative for dizziness.  Psychiatric/Behavioral: The patient is not nervous/anxious.      Physical Exam Constitutional:      General: She is not in acute distress.    Appearance: She is well-developed. She is obese. She is not diaphoretic.  Neck:     Musculoskeletal: Neck supple.     Thyroid: No thyromegaly.  Cardiovascular:     Rate and Rhythm: Normal rate and regular rhythm.     Pulses: Normal pulses.     Heart sounds: Murmur present.     Comments: 1/6 Pulmonary:     Effort: Pulmonary effort is normal. No respiratory distress.     Breath sounds: Normal breath sounds.     Comments: 02 dependent at 2L  Abdominal:     General: Bowel sounds are normal. There is no distension.     Palpations: Abdomen is soft.     Tenderness: There is no abdominal  tenderness.  Musculoskeletal:     Right lower leg: Edema present.     Left lower leg: Edema present.     Comments: s able to move all extremities Has 1+ bilateral lower extremity edema with left worse than right  History of bilateral knee replacements     Lymphadenopathy:     Cervical: No cervical adenopathy.  Skin:    General: Skin is warm and dry.     Comments: Right mastectomy 1994   Neurological:     Mental Status: She is alert and oriented to person, place, and time.  Psychiatric:        Mood and Affect: Mood normal.     ASSESSMENT/ PLAN:   Patient is being discharged with the following home health services:  Pt/ot/rn: to evaluate and treat as indicated for gait balance strength adl training and medication management.   Patient is being discharged with the following durable medical equipment:  Neb machine with supplies. 02 2L/[place] stationary gaseous and portable gaseous 02 due to acute on chronic respiratory failure with hypoxia.   Patient has been advised to f/u with their PCP in 1-2 weeks to bring them up to date on their rehab stay.  Social services at facility was responsible for arranging this appointment.  Pt was provided with a 30 day supply of prescriptions for medications and refills must be obtained from their PCP.  For controlled substances, a more limited supply may be provided adequate until PCP appointment only.   Her medications will be provided by the  receiving facility.   Time spent with patient: 35 minutes: for assisted living placement; medications; and 02 needs; verbalized understanding.    Ok Edwards NP [facility]  Contact [phone] Monday through Friday 8am- 5pm  After hours call [phone]

## 2018-05-31 DIAGNOSIS — J159 Unspecified bacterial pneumonia: Secondary | ICD-10-CM | POA: Diagnosis not present

## 2018-05-31 DIAGNOSIS — I5032 Chronic diastolic (congestive) heart failure: Secondary | ICD-10-CM

## 2018-05-31 DIAGNOSIS — N184 Chronic kidney disease, stage 4 (severe): Secondary | ICD-10-CM

## 2018-05-31 DIAGNOSIS — J45909 Unspecified asthma, uncomplicated: Secondary | ICD-10-CM

## 2018-05-31 DIAGNOSIS — I130 Hypertensive heart and chronic kidney disease with heart failure and stage 1 through stage 4 chronic kidney disease, or unspecified chronic kidney disease: Secondary | ICD-10-CM

## 2018-05-31 DIAGNOSIS — E1122 Type 2 diabetes mellitus with diabetic chronic kidney disease: Secondary | ICD-10-CM

## 2018-05-31 DIAGNOSIS — E1140 Type 2 diabetes mellitus with diabetic neuropathy, unspecified: Secondary | ICD-10-CM

## 2018-05-31 DIAGNOSIS — J9611 Chronic respiratory failure with hypoxia: Secondary | ICD-10-CM

## 2019-10-02 ENCOUNTER — Emergency Department: Payer: Medicare Other

## 2019-10-02 ENCOUNTER — Other Ambulatory Visit: Payer: Self-pay

## 2019-10-02 ENCOUNTER — Inpatient Hospital Stay
Admission: EM | Admit: 2019-10-02 | Discharge: 2019-10-06 | DRG: 291 | Disposition: A | Discharge status: Home Health | Payer: Medicare Other | Source: Skilled Nursing Facility | Attending: Internal Medicine | Admitting: Internal Medicine

## 2019-10-02 DIAGNOSIS — Z853 Personal history of malignant neoplasm of breast: Secondary | ICD-10-CM

## 2019-10-02 DIAGNOSIS — Z801 Family history of malignant neoplasm of trachea, bronchus and lung: Secondary | ICD-10-CM | POA: Diagnosis not present

## 2019-10-02 DIAGNOSIS — I480 Paroxysmal atrial fibrillation: Secondary | ICD-10-CM | POA: Diagnosis present

## 2019-10-02 DIAGNOSIS — I440 Atrioventricular block, first degree: Secondary | ICD-10-CM | POA: Diagnosis present

## 2019-10-02 DIAGNOSIS — E785 Hyperlipidemia, unspecified: Secondary | ICD-10-CM | POA: Diagnosis present

## 2019-10-02 DIAGNOSIS — I071 Rheumatic tricuspid insufficiency: Secondary | ICD-10-CM | POA: Diagnosis present

## 2019-10-02 DIAGNOSIS — E039 Hypothyroidism, unspecified: Secondary | ICD-10-CM | POA: Diagnosis present

## 2019-10-02 DIAGNOSIS — I272 Pulmonary hypertension, unspecified: Secondary | ICD-10-CM | POA: Diagnosis present

## 2019-10-02 DIAGNOSIS — Z20822 Contact with and (suspected) exposure to covid-19: Secondary | ICD-10-CM | POA: Diagnosis present

## 2019-10-02 DIAGNOSIS — I130 Hypertensive heart and chronic kidney disease with heart failure and stage 1 through stage 4 chronic kidney disease, or unspecified chronic kidney disease: Principal | ICD-10-CM | POA: Diagnosis present

## 2019-10-02 DIAGNOSIS — J40 Bronchitis, not specified as acute or chronic: Secondary | ICD-10-CM | POA: Diagnosis not present

## 2019-10-02 DIAGNOSIS — Z79890 Hormone replacement therapy (postmenopausal): Secondary | ICD-10-CM

## 2019-10-02 DIAGNOSIS — I10 Essential (primary) hypertension: Secondary | ICD-10-CM | POA: Diagnosis not present

## 2019-10-02 DIAGNOSIS — Z79899 Other long term (current) drug therapy: Secondary | ICD-10-CM | POA: Diagnosis not present

## 2019-10-02 DIAGNOSIS — J9601 Acute respiratory failure with hypoxia: Secondary | ICD-10-CM | POA: Diagnosis present

## 2019-10-02 DIAGNOSIS — Z9011 Acquired absence of right breast and nipple: Secondary | ICD-10-CM

## 2019-10-02 DIAGNOSIS — I700 Atherosclerosis of aorta: Secondary | ICD-10-CM | POA: Diagnosis present

## 2019-10-02 DIAGNOSIS — Z880 Allergy status to penicillin: Secondary | ICD-10-CM | POA: Diagnosis not present

## 2019-10-02 DIAGNOSIS — Z7901 Long term (current) use of anticoagulants: Secondary | ICD-10-CM

## 2019-10-02 DIAGNOSIS — Z66 Do not resuscitate: Secondary | ICD-10-CM | POA: Diagnosis present

## 2019-10-02 DIAGNOSIS — E1122 Type 2 diabetes mellitus with diabetic chronic kidney disease: Secondary | ICD-10-CM | POA: Diagnosis present

## 2019-10-02 DIAGNOSIS — J9621 Acute and chronic respiratory failure with hypoxia: Secondary | ICD-10-CM

## 2019-10-02 DIAGNOSIS — Z7982 Long term (current) use of aspirin: Secondary | ICD-10-CM

## 2019-10-02 DIAGNOSIS — N179 Acute kidney failure, unspecified: Secondary | ICD-10-CM | POA: Diagnosis present

## 2019-10-02 DIAGNOSIS — I5033 Acute on chronic diastolic (congestive) heart failure: Secondary | ICD-10-CM | POA: Diagnosis present

## 2019-10-02 DIAGNOSIS — N184 Chronic kidney disease, stage 4 (severe): Secondary | ICD-10-CM | POA: Diagnosis present

## 2019-10-02 DIAGNOSIS — I5032 Chronic diastolic (congestive) heart failure: Secondary | ICD-10-CM | POA: Diagnosis present

## 2019-10-02 DIAGNOSIS — I509 Heart failure, unspecified: Secondary | ICD-10-CM

## 2019-10-02 DIAGNOSIS — J45909 Unspecified asthma, uncomplicated: Secondary | ICD-10-CM | POA: Diagnosis present

## 2019-10-02 DIAGNOSIS — Z7951 Long term (current) use of inhaled steroids: Secondary | ICD-10-CM

## 2019-10-02 DIAGNOSIS — D631 Anemia in chronic kidney disease: Secondary | ICD-10-CM | POA: Diagnosis present

## 2019-10-02 DIAGNOSIS — Z9981 Dependence on supplemental oxygen: Secondary | ICD-10-CM

## 2019-10-02 DIAGNOSIS — M109 Gout, unspecified: Secondary | ICD-10-CM | POA: Diagnosis present

## 2019-10-02 DIAGNOSIS — I444 Left anterior fascicular block: Secondary | ICD-10-CM | POA: Diagnosis present

## 2019-10-02 LAB — BASIC METABOLIC PANEL
Anion gap: 9 (ref 5–15)
BUN: 41 mg/dL — ABNORMAL HIGH (ref 8–23)
CO2: 23 mmol/L (ref 22–32)
Calcium: 8.8 mg/dL — ABNORMAL LOW (ref 8.9–10.3)
Chloride: 108 mmol/L (ref 98–111)
Creatinine, Ser: 2.32 mg/dL — ABNORMAL HIGH (ref 0.44–1.00)
GFR calc Af Amer: 21 mL/min — ABNORMAL LOW (ref >60)
GFR calc non Af Amer: 18 mL/min — ABNORMAL LOW (ref >60)
Glucose, Bld: 143 mg/dL — ABNORMAL HIGH (ref 70–99)
Potassium: 3.9 mmol/L (ref 3.5–5.1)
Sodium: 140 mmol/L (ref 135–145)

## 2019-10-02 LAB — CBC
HCT: 33.9 % — ABNORMAL LOW (ref 36.0–46.0)
Hemoglobin: 10.6 g/dL — ABNORMAL LOW (ref 12.0–15.0)
MCH: 28 pg (ref 26.0–34.0)
MCHC: 31.3 g/dL (ref 30.0–36.0)
MCV: 89.7 fL (ref 80.0–100.0)
Platelets: 215 10*3/uL (ref 150–400)
RBC: 3.78 MIL/uL — ABNORMAL LOW (ref 3.87–5.11)
RDW: 15.9 % — ABNORMAL HIGH (ref 11.5–15.5)
WBC: 5.8 10*3/uL (ref 4.0–10.5)
nRBC: 0 % (ref 0.0–0.2)

## 2019-10-02 LAB — GLUCOSE, CAPILLARY: Glucose-Capillary: 117 mg/dL — ABNORMAL HIGH (ref 70–99)

## 2019-10-02 LAB — TSH: TSH: 7.885 u[IU]/mL — ABNORMAL HIGH (ref 0.350–4.500)

## 2019-10-02 LAB — SARS CORONAVIRUS 2 BY RT PCR (HOSPITAL ORDER, PERFORMED IN ~~LOC~~ HOSPITAL LAB): SARS Coronavirus 2: NEGATIVE

## 2019-10-02 LAB — BRAIN NATRIURETIC PEPTIDE: B Natriuretic Peptide: 579.4 pg/mL — ABNORMAL HIGH (ref 0.0–100.0)

## 2019-10-02 LAB — TROPONIN I (HIGH SENSITIVITY): Troponin I (High Sensitivity): 9 ng/L (ref <18)

## 2019-10-02 MED ORDER — ASPIRIN EC 81 MG PO TBEC
81.0000 mg | DELAYED_RELEASE_TABLET | Freq: Every day | ORAL | Status: DC
  Administered 2019-10-03: 81 mg via ORAL
  Filled 2019-10-02 (×2): qty 1

## 2019-10-02 MED ORDER — DOCUSATE SODIUM 100 MG PO CAPS
100.0000 mg | ORAL_CAPSULE | Freq: Every day | ORAL | Status: DC
  Administered 2019-10-03 – 2019-10-06 (×4): 100 mg via ORAL
  Filled 2019-10-02 (×4): qty 1

## 2019-10-02 MED ORDER — POTASSIUM CHLORIDE CRYS ER 20 MEQ PO TBCR: 20.0000 meq | EXTENDED_RELEASE_TABLET | Freq: Every day | ORAL | Status: DC

## 2019-10-02 MED ORDER — DILTIAZEM HCL ER COATED BEADS 300 MG PO CP24
300.0000 mg | ORAL_CAPSULE | Freq: Every day | ORAL | Status: DC
  Filled 2019-10-02: qty 1

## 2019-10-02 MED ORDER — SODIUM CHLORIDE 0.9% FLUSH: 3.0000 mL | INTRAVENOUS | Status: DC | PRN

## 2019-10-02 MED ORDER — SODIUM CHLORIDE 0.9 % IV SOLN: 250.0000 mL | INTRAVENOUS | Status: DC | PRN

## 2019-10-02 MED ORDER — FUROSEMIDE 10 MG/ML IJ SOLN
40.0000 mg | Freq: Two times a day (BID) | INTRAMUSCULAR | Status: DC
  Administered 2019-10-03: 40 mg via INTRAVENOUS
  Filled 2019-10-02: qty 4

## 2019-10-02 MED ORDER — AMIODARONE HCL 200 MG PO TABS
200.0000 mg | ORAL_TABLET | Freq: Every day | ORAL | Status: DC
  Administered 2019-10-03 – 2019-10-06 (×4): 200 mg via ORAL
  Filled 2019-10-02 (×4): qty 1

## 2019-10-02 MED ORDER — ONDANSETRON HCL 4 MG/2ML IJ SOLN: 4.0000 mg | Freq: Four times a day (QID) | INTRAMUSCULAR | Status: DC | PRN

## 2019-10-02 MED ORDER — ACETAMINOPHEN 325 MG PO TABS: 650.0000 mg | ORAL_TABLET | ORAL | Status: DC | PRN

## 2019-10-02 MED ORDER — APIXABAN 2.5 MG PO TABS
2.5000 mg | ORAL_TABLET | Freq: Two times a day (BID) | ORAL | Status: DC
  Administered 2019-10-02 – 2019-10-06 (×8): 2.5 mg via ORAL
  Filled 2019-10-02 (×8): qty 1

## 2019-10-02 MED ORDER — INSULIN ASPART 100 UNIT/ML ~~LOC~~ SOLN
0.0000 [IU] | Freq: Three times a day (TID) | SUBCUTANEOUS | Status: DC
  Administered 2019-10-03: 2 [IU] via SUBCUTANEOUS
  Filled 2019-10-02 (×2): qty 1

## 2019-10-02 MED ORDER — VITAMIN D 25 MCG (1000 UNIT) PO TABS
1000.0000 [IU] | ORAL_TABLET | Freq: Every day | ORAL | Status: DC
  Administered 2019-10-03 – 2019-10-06 (×4): 1000 [IU] via ORAL
  Filled 2019-10-02 (×4): qty 1

## 2019-10-02 MED ORDER — ZOLPIDEM TARTRATE 5 MG PO TABS: 5.0000 mg | ORAL_TABLET | Freq: Every evening | ORAL | Status: DC | PRN

## 2019-10-02 MED ORDER — ALPRAZOLAM 0.25 MG PO TABS: 0.2500 mg | ORAL_TABLET | Freq: Two times a day (BID) | ORAL | Status: DC | PRN

## 2019-10-02 MED ORDER — VITAMIN B-12 1000 MCG PO TABS
1000.0000 ug | ORAL_TABLET | Freq: Every day | ORAL | Status: DC
  Administered 2019-10-03 – 2019-10-06 (×4): 1000 ug via ORAL
  Filled 2019-10-02 (×4): qty 1

## 2019-10-02 MED ORDER — COLCHICINE 0.6 MG PO TABS
0.6000 mg | ORAL_TABLET | Freq: Every day | ORAL | Status: DC
  Administered 2019-10-03: 0.6 mg via ORAL
  Filled 2019-10-02: qty 1

## 2019-10-02 MED ORDER — ENSURE ENLIVE PO LIQD
237.0000 mL | Freq: Two times a day (BID) | ORAL | Status: DC
  Administered 2019-10-04 – 2019-10-05 (×3): 237 mL via ORAL

## 2019-10-02 MED ORDER — LEVOTHYROXINE SODIUM 50 MCG PO TABS
75.0000 ug | ORAL_TABLET | Freq: Every day | ORAL | Status: DC
  Administered 2019-10-04 – 2019-10-06 (×3): 75 ug via ORAL
  Filled 2019-10-02 (×3): qty 1

## 2019-10-02 MED ORDER — FUROSEMIDE 10 MG/ML IJ SOLN
60.0000 mg | Freq: Once | INTRAMUSCULAR | Status: AC
  Administered 2019-10-02: 60 mg via INTRAVENOUS
  Filled 2019-10-02: qty 8

## 2019-10-02 MED ORDER — SODIUM CHLORIDE 0.9% FLUSH
3.0000 mL | Freq: Two times a day (BID) | INTRAVENOUS | Status: DC
  Administered 2019-10-03 – 2019-10-06 (×7): 3 mL via INTRAVENOUS

## 2019-10-02 MED ORDER — ALLOPURINOL 100 MG PO TABS
100.0000 mg | ORAL_TABLET | Freq: Every day | ORAL | Status: DC
  Administered 2019-10-03 – 2019-10-06 (×4): 100 mg via ORAL
  Filled 2019-10-02 (×4): qty 1

## 2019-10-02 MED ORDER — METOPROLOL SUCCINATE ER 100 MG PO TB24
100.0000 mg | ORAL_TABLET | Freq: Every day | ORAL | Status: DC
  Administered 2019-10-03 – 2019-10-04 (×2): 100 mg via ORAL
  Filled 2019-10-02: qty 2
  Filled 2019-10-02: qty 1

## 2019-10-02 MED ORDER — HYDRALAZINE HCL 10 MG PO TABS
10.0000 mg | ORAL_TABLET | Freq: Three times a day (TID) | ORAL | Status: DC
  Administered 2019-10-02 – 2019-10-06 (×11): 10 mg via ORAL
  Filled 2019-10-02 (×13): qty 1

## 2019-10-02 MED ORDER — PRAVASTATIN SODIUM 40 MG PO TABS
40.0000 mg | ORAL_TABLET | ORAL | Status: DC
  Administered 2019-10-03 – 2019-10-05 (×2): 40 mg via ORAL
  Filled 2019-10-02 (×2): qty 1

## 2019-10-02 MED ORDER — ADULT MULTIVITAMIN W/MINERALS CH
1.0000 | ORAL_TABLET | Freq: Every day | ORAL | Status: DC
  Administered 2019-10-03 – 2019-10-06 (×4): 1 via ORAL
  Filled 2019-10-02 (×4): qty 1

## 2019-10-02 NOTE — ED Triage Notes (Addendum)
Pt comes via POv from [facility] with c/o SOB. Pt states this started couple of weeks ago. Pt states bilateral leg swelling.  Pt was sent over also to check out some labs for kidneys.  Pt presents with labored breathing and some wheezing. Current O2 is 91% RA. Pt denies any chest pain.

## 2019-10-02 NOTE — ED Provider Notes (Signed)
[facility] Emergency Department Provider Note  Time seen: 5:16 PM  I have reviewed the triage vital signs and the nursing notes.   HISTORY  Chief Complaint Shortness of Breath   HPI Destiny Oliver is a 86 y.o. female with a past medical history of CKD, hypertension, tachycardia, CHF, presents to the emergency department for shortness of breath.   According to the patient for the past several weeks she has been feeling more short of breath.  Patient saw her cardiologist yesterday Dr. Nehemiah Massed, but states she is no better today so she came to the emergency department.  Patient denies any chest pain at any point.  States she has noticed some increased lower extremity edema.  Denies any lower extremity pain.  States that shortness of breath is worse with any attempted ambulation.  Currently satting in the low 90s on room air.  Past Medical History:  Diagnosis Date  . Breast cancer ([place]) 1994   right breast  . CKD (chronic kidney disease)   . Hypertension   . Tachycardia     Patient Active Problem List   Diagnosis Date Noted  . HCAP (healthcare-associated pneumonia) 05/23/2018  . UTI due to extended-spectrum beta lactamase (ESBL) producing Escherichia coli 05/15/2018  . Diplopia 05/08/2018  . Vertigo 05/08/2018  . Type 2 diabetes mellitus with stage 4 chronic kidney disease and hypertension ([place]) 05/02/2018  . Stage 4 chronic renal impairment associated with type 2 diabetes mellitus ([place]) 05/02/2018  . Hypertensive heart and kidney disease with chronic diastolic congestive heart failure and stage 4 chronic kidney disease ([place]) 05/02/2018  . Acute on chronic diastolic heart failure ([place]) 05/02/2018  . Dyslipidemia associated with type 2 diabetes mellitus ([place]) 05/02/2018  . Anemia associated with stage 4 chronic renal failure ([place]) 05/02/2018  . Acute on chronic respiratory failure with hypoxia ([place]) 05/02/2018  . Rhabdomyolysis 04/29/2018  . A-fib ([place])  04/18/2018    Past Surgical History:  Procedure Laterality Date  . MASTECTOMY Right 1994    Prior to Admission medications   Medication Sig Start Date End Date Taking? Authorizing Provider  amiodarone (PACERONE) 200 MG tablet Take 1 tablet (200 mg total) by mouth daily. 04/30/18   Demetrios Loll, MD  apixaban (ELIQUIS) 2.5 MG TABS tablet Take 1 tablet (2.5 mg total) by mouth 2 (two) times daily. 04/30/18   Demetrios Loll, MD  aspirin EC 81 MG tablet Take 81 mg by mouth daily.    [provider]  budesonide (PULMICORT) 0.5 MG/2ML nebulizer solution Take 2 mLs (0.5 mg total) by nebulization 2 (two) times daily. 04/30/18   Demetrios Loll, MD  cholecalciferol (VITAMIN D3) 25 MCG (1000 UT) tablet Take 1,000 Units by mouth daily.    [provider]  cyanocobalamin ([employer]S VITAMIN B12) 1000 MCG tablet Take 1,000 mcg by mouth daily.     [provider]  diltiazem (CARDIZEM CD) 240 MG 24 hr capsule Take 1 capsule (240 mg total) by mouth daily. 04/30/18   Demetrios Loll, MD  ferrous sulfate 325 (65 FE) MG tablet Take 325 mg by mouth daily.    [provider]  ipratropium-albuterol (DUONEB) 0.5-2.5 (3) MG/3ML SOLN Take 3 mLs by nebulization every 4 (four) hours as needed (SOB, wheezing). 04/26/18   Salary, [name], MD  metoprolol (TOPROL-XL) 200 MG 24 hr tablet Take 100 mg by mouth 2 (two) times daily. 07/08/15   [provider]  Multiple Vitamins-Minerals (HAIR SKIN AND NAILS FORMULA) TABS Take 1 tablet by  mouth daily.    [provider]  NON FORMULARY Fluid Restriction - 1500 ml / day  Diet Type - NAS 04/30/18   [provider]  Nutritional Supplements (ENSURE ENLIVE PO) Give by mouth two times daily between meals 04/30/18   [provider]  OXYGEN Inhale 2 L/min into the lungs continuous. 04/30/18   [provider]  potassium chloride (K-DUR) 10 MEQ tablet Take 10 mEq by mouth daily.  05/17/18   [provider]  pravastatin  (PRAVACHOL) 40 MG tablet Take 40 mg by mouth every other day. 02/08/15   [provider]  torsemide (DEMADEX) 20 MG tablet Take 20 mg by mouth 2 (two) times daily.  05/17/18   [provider]  traZODone (DESYREL) 100 MG tablet Take 1 tablet (100 mg total) by mouth at bedtime as needed for sleep. 04/26/18   Salary, [name], MD    Allergies  Allergen Reactions  . Penicillins Swelling    No family history on file.  Social History Social History   Tobacco Use  . Smoking status: Never Smoker  . Smokeless tobacco: Never Used  Substance Use Topics  . Alcohol use: Never  . Drug use: Never    Review of Systems Constitutional: Negative for fever. Cardiovascular: Negative for chest pain. Respiratory: Positive for shortness of breath Gastrointestinal: Negative for abdominal pain Genitourinary: Negative for urinary compaints Musculoskeletal: Negative for musculoskeletal complaints Neurological: Negative for headache All other ROS negative  ____________________________________________   PHYSICAL EXAM:  VITAL SIGNS: ED Triage Vitals  Enc Vitals Group     BP 10/02/19 1647 139/60     Pulse Rate 10/02/19 1647 60     Resp 10/02/19 1647 (!) 22     Temp 10/02/19 1647 98 F (36.7 C)     Temp src --      SpO2 10/02/19 1647 91 %     Weight 10/02/19 1648 215 lb (97.5 kg)     Height 10/02/19 1648 5\' 1"  (1.549 m)     Head Circumference --      Peak Flow --      Pain Score 10/02/19 1648 0     Pain Loc --      Pain Edu? --      Excl. in [place]? --     Constitutional: Alert and oriented. Well appearing and in no distress. Eyes: Normal exam ENT      Head: Normocephalic and atraumatic.      Mouth/Throat: Mucous membranes are moist. Cardiovascular: Normal rate, regular rhythm.  Respiratory: Slight tachypnea around 20 to 22 breaths/min.  Overall clear breath sounds bilaterally without any obvious wheeze rales or rhonchi. Gastrointestinal: Soft and nontender. No  distention.   Musculoskeletal: Nontender with normal range of motion in all extremities.  2+ lower extremity edema equal bilaterally.  Nontender. Neurologic:  Normal speech and language. No gross focal neurologic deficits  Skin:  Skin is warm, dry and intact.  Psychiatric: Mood and affect are normal.  ____________________________________________    EKG  EKG viewed and interpreted by myself shows sinus bradycardia 57 bpm with a narrow QRS, left axis deviation, largely normal intervals with no concerning ST changes.  ____________________________________________    RADIOLOGY  Chest x-ray shows possibility of pneumonia. CT scan of the chest does not appear to show pneumonia but does appear to show interstitial edema.  ____________________________________________   INITIAL IMPRESSION / ASSESSMENT AND PLAN / ED COURSE  Pertinent labs & imaging results that were available during my  care of the patient were reviewed by me and considered in my medical decision making (see chart for details).   Patient presents to the emergency department for shortness of breath.  Patient states symptoms have been ongoing/worsening over the past couple weeks.  Denies any chest pain at any point.  Overall the patient appears well.  Does have lower extreme edema.  We will check labs, BNP, troponin, chest x-ray and continue to closely monitor.  Patient agreeable to plan.  Patient's BNP is slightly elevated around 580.  Creatinine is slightly elevated as well.  Troponin is pending.  X-rays pending.  X-ray shows possible pneumonia.  CT scan of the chest shows interstitial edema which fits the patient's clinical picture better.  We will dose IV Lasix.  Given the patient's low O2 saturation 90% on room air we will admit to the hospital service for IV diuresis.  Patient agreeable plan of care.  TERRIANNE CAVNESS was evaluated in Emergency Department on 10/02/2019 for the symptoms described in the history of present  illness. She was evaluated in the context of the global COVID-19 pandemic, which necessitated consideration that the patient might be at risk for infection with the SARS-CoV-2 virus that causes COVID-19. Institutional protocols and algorithms that pertain to the evaluation of patients at risk for COVID-19 are in a state of rapid change based on information released by regulatory bodies including the CDC and federal and state organizations. These policies and algorithms were followed during the patient's care in the ED.  ____________________________________________   FINAL CLINICAL IMPRESSION(S) / ED DIAGNOSES  Dyspnea CHF   Harvest Dark, MD 10/02/19 2233

## 2019-10-02 NOTE — H&P (Signed)
[place] [facility] [place] NAME: Destiny Oliver    MR#:  [id number]  DATE OF BIRTH:  [birth year]  DATE OF ADMISSION:  10/02/2019  PRIMARY CARE PHYSICIAN: Juluis Pitch, MD   REQUESTING/REFERRING PHYSICIAN: Harvest Dark, MD  CHIEF COMPLAINT:   Chief Complaint  Patient presents with  . Shortness of Breath    HISTORY OF PRESENT ILLNESS:  Destiny Oliver  is a 86 y.o. Caucasian female with a known history of diastolic CHF, hypertension, dyslipidemia, type 2 diabetes mellitus, hypothyroidism and stage IV chronic kidney disease, who presented to the emergency room with acute onset of worsening dyspnea over the last couple of weeks as well as worsening lower extremity edema.  She admits to orthopnea and paroxysmal nocturnal dyspnea as well as dyspnea on exertion.  She has been having mild dry cough and wheezing.  She denied any fever or chills.  She was initially taking Lasix twice daily and it was switched to 2 p.o. in a.m.  No nausea vomiting or abdominal pain.  No chest pain or palpitations.    Upon presentation to the emergency room, respiratory rate was 22 and pulse ox 90 was 91% on room air with otherwise normal vital signs.  Labs were remarkable for a BNP of 579.4 and a BUN of 41/2.32 compared to 28/1.88 on 05/14/2018.  High-sensitivity troponin I was 9.  CBC showed anemia at her baseline chest x-ray showed left mid to lower lung field atelectasis and densities suspicious for pneumonia however chest CTA revealed no acute airspace disease.  It showed bibasal scarring with scattered areas of air trapping consistent with reactive airway disease as well as cardiomegaly with mild interlobular septal thickening and trace bilateral pleural effusions that could reflect an element of mild interstitial edema.  It showed aortic atherosclerosis and stable borderline enlarged mediastinal lymph nodes, likely reactive.  EKG showed sinus bradycardia with rate of 57 with left anterior  fascicular block and poor R wave progression.  The patient was given 60 mg of IV Lasix.  She will be admitted to a cardiac progressive unit bed for further evaluation and management. PAST MEDICAL HISTORY:   Past Medical History:  Diagnosis Date  . Breast cancer ([place]) 1994   right breast  . CKD (chronic kidney disease)   . Hypertension   . Tachycardia   Diastolic CHF Paroxysmal atrial fibrillation Type 2 diabetes mellitus  PAST SURGICAL HISTORY:   Past Surgical History:  Procedure Laterality Date  . MASTECTOMY Right 1994    SOCIAL HISTORY:   Social History   Tobacco Use  . Smoking status: Never Smoker  . Smokeless tobacco: Never Used  Substance Use Topics  . Alcohol use: Never    FAMILY HISTORY:  Positive for lung cancer in her brother.  DRUG ALLERGIES:   Allergies  Allergen Reactions  . Penicillins Swelling    REVIEW OF SYSTEMS:   ROS As per history of present illness. All pertinent systems were reviewed above. Constitutional,  HEENT, cardiovascular, respiratory, GI, GU, musculoskeletal, neuro, psychiatric, endocrine,  integumentary and hematologic systems were reviewed and are otherwise  negative/unremarkable except for positive findings mentioned above in the HPI.   MEDICATIONS AT HOME:   Prior to Admission medications   Medication Sig Start Date End Date Taking? Authorizing Provider  allopurinol (ZYLOPRIM) 100 MG tablet Take 100 mg by mouth daily. 09/12/19  Yes [provider]  amiodarone (PACERONE) 200 MG tablet Take 1 tablet (200 mg total) by mouth daily. 04/30/18  Yes Demetrios Loll, MD  apixaban (ELIQUIS) 2.5 MG TABS tablet Take 1 tablet (2.5 mg total) by mouth 2 (two) times daily. 04/30/18  Yes Demetrios Loll, MD  cholecalciferol (VITAMIN D3) 25 MCG (1000 UT) tablet Take 1,000 Units by mouth daily.   Yes [provider]  colchicine 0.6 MG tablet  05/20/19  Yes [provider]  cyanocobalamin ([employer] VITAMIN B12) 1000 MCG tablet Take  1,000 mcg by mouth daily.    Yes [provider]  docusate sodium (COLACE) 100 MG capsule Take 100 mg by mouth daily.   Yes [provider]  Lactobacillus TABS Take by mouth.   Yes [provider]  levothyroxine (SYNTHROID) 75 MCG tablet Take 75 mcg by mouth daily. 10/02/19  Yes [provider]  metoprolol succinate (TOPROL-XL) 100 MG 24 hr tablet Take 100 mg by mouth daily.  07/08/15  Yes [provider]  Multiple Vitamins-Minerals (HAIR SKIN AND NAILS FORMULA) TABS Take 1 tablet by mouth daily.   Yes [provider]  Nutritional Supplements (ENSURE ENLIVE PO) Give by mouth two times daily between meals 04/30/18  Yes [provider]  polyethylene glycol powder (GLYCOLAX/MIRALAX) 17 GM/SCOOP powder Take by mouth.   Yes [provider]  potassium chloride SA (KLOR-CON) 20 MEQ tablet Take 20 mEq by mouth daily. 09/12/19  Yes [provider]  pravastatin (PRAVACHOL) 40 MG tablet Take 40 mg by mouth every other day. 02/08/15  Yes [provider]  torsemide (DEMADEX) 20 MG tablet Take 20 mg by mouth 2 (two) times daily.  05/17/18  Yes [provider]  diltiazem (TIAZAC) 300 MG 24 hr capsule Take 300 mg by mouth daily.  08/14/19 10/01/19  [provider]  OXYGEN Inhale 2 L/min into the lungs continuous. 04/30/18   [provider]      VITAL SIGNS:  Blood pressure (!) 145/74, pulse (!) 54, temperature 98 F (36.7 C), resp. rate (!) 22, height 5\' 1"  (1.549 m), weight 97.5 kg, SpO2 91 %.  PHYSICAL EXAMINATION:  Physical Exam  GENERAL:  86 y.o.-year-old patient lying in the bed with mild respiratory distress with conversational dyspnea. EYES: Pupils equal, round, reactive to light and accommodation. No scleral icterus. Extraocular muscles intact.  HEENT: Head atraumatic, normocephalic. Oropharynx and nasopharynx clear.  NECK:  Supple, no jugular venous distention. No thyroid enlargement, no  tenderness.  LUNGS: Diminished bibasal breath sounds with bibasal rales. CARDIOVASCULAR: Regular rate and rhythm, S1, S2 normal. No murmurs, rubs, or gallops.  ABDOMEN: Soft, nondistended, nontender. Bowel sounds present. No organomegaly or mass.  EXTREMITIES: 1+ bilateral lower extremity pitting edema with no cyanosis, or clubbing.  NEUROLOGIC: Cranial nerves II through XII are intact. Muscle strength 5/5 in all extremities. Sensation intact. Gait not checked.  PSYCHIATRIC: The patient is alert and oriented x 3.  Normal affect and good eye contact. SKIN: No obvious rash, lesion, or ulcer.   LABORATORY PANEL:   CBC Recent Labs  Lab 10/02/19 1652  WBC 5.8  HGB 10.6*  HCT 33.9*  PLT 215   ------------------------------------------------------------------------------------------------------------------  Chemistries  Recent Labs  Lab 10/02/19 1652  NA 140  K 3.9  CL 108  CO2 23  GLUCOSE 143*  BUN 41*  CREATININE 2.32*  CALCIUM 8.8*   ------------------------------------------------------------------------------------------------------------------  Cardiac Enzymes No results for input(s): TROPONINI in the last 168 hours. ------------------------------------------------------------------------------------------------------------------  RADIOLOGY:  DG Chest 2 View  Result Date: 10/02/2019 CLINICAL DATA:  86 year old female with shortness of breath. EXAM: CHEST - 2  VIEW COMPARISON:  Chest radiograph dated 04/26/2018. FINDINGS: There is diffuse interstitial coarsening and bronchitic changes. Left mid to lower lung field interstitial and streaky densities may represent combination of atelectasis and infiltrate. Clinical correlation is recommended. No large pleural effusion or pneumothorax. Stable cardiomegaly. Atherosclerotic calcification of the aorta. No acute osseous pathology. Degenerative changes of the spine. IMPRESSION: Left mid to lower lung field atelectasis densities  concerning for pneumonia. Clinical correlation is recommended. Electronically Signed   By: Anner Crete M.D.   On: 10/02/2019 17:46   CT Chest Wo Contrast  Result Date: 10/02/2019 CLINICAL DATA:  Shortness of breath for several weeks, bilateral lower extremity edema, wheezing EXAM: CT CHEST WITHOUT CONTRAST TECHNIQUE: Multidetector CT imaging of the chest was performed following the standard protocol without IV contrast. COMPARISON:  10/02/2019, 04/27/2018 FINDINGS: Cardiovascular: Borderline cardiomegaly unchanged. No pericardial effusion. There is extensive atherosclerosis of the thoracic aorta, stable. Minimal atherosclerosis of the LAD distribution of the coronary vasculature. Mediastinum/Nodes: Stable borderline enlarged mediastinal lymph nodes, largest in the precarinal region measuring 13 mm in short axis reference image 35/4. Thyroid, trachea, and esophagus are stable. Lungs/Pleura: There is diffuse interlobular septal thickening. Scattered mosaic attenuation of the lungs consistent with areas of air trapping. Scattered areas of scarring are seen primarily at the lung bases. No focal airspace disease. Trace bilateral pleural effusions. No pneumothorax. The central airways are patent. Upper Abdomen: No acute abnormality. Musculoskeletal: No acute or destructive bony lesions. Reconstructed images demonstrate no additional findings. IMPRESSION: 1. Bibasilar scarring, with scattered areas of air trapping consistent with reactive airway disease. 2. Cardiomegaly with mild interlobular septal thickening and trace bilateral effusions could reflect an element of mild interstitial edema. 3. No acute airspace disease. 4.  Aortic Atherosclerosis (ICD10-I70.0). 5. Stable borderline enlarged mediastinal lymph nodes, likely reactive. Electronically Signed   By: Randa Ngo M.D.   On: 10/02/2019 18:37      IMPRESSION AND PLAN:  1.  Acute on chronic diastolic CHF with subsequent mild acute hypoxic respiratory  failure. -The patient will be admitted to a cardiac progressive unit bed. -We will continue diuresis with IV Lasix. -A 2D echo and a cardiology consult will be obtained. -We will follow serial troponin I's. -I notified Dr. Ubaldo Glassing about patient.  2.  Hypertension. -We will continue Toprol-XL, hydralazine as well as Tiazac.  3.  Paroxysmal atrial fibrillation. -We will continue Cardizem CD, Toprol-XL as well as amiodarone and Eliquis.  4.  Hypothyroidism. -We will check TSH and continue Synthroid.  5.  Dyslipidemia. -We will continue statin therapy.  6.  Gout. -We will continue allopurinol and colchicine.  7.  Type 2 diabetes mellitus. -The patient will be placed on supplement coverage with NovoLog.  8.  DVT prophylaxis. -We will continue Eliquis.   All the records are reviewed and Oliver discussed with ED provider. The plan of care was discussed in details with the patient (and family). I answered all questions. The patient agreed to proceed with the above mentioned plan. Further management will depend upon hospital course.   CODE STATUS: Full code  Status is: Inpatient  Remains inpatient appropriate because:Ongoing diagnostic testing needed not appropriate for outpatient work up, Unsafe d/c plan, IV treatments appropriate due to intensity of illness or inability to take PO and Inpatient level of care appropriate due to severity of illness   Dispo: The patient is from: Home              Anticipated d/c is to: Home  Anticipated d/c date is: 2 days              Patient currently is not medically stable to d/c.   TOTAL TIME TAKING CARE OF THIS PATIENT: 55 minutes.    Christel Mormon M.D on 10/02/2019 at 7:51 PM  [facility]   From 7 PM-7 AM, contact night-coverage [email or web address]  CC: Primary care physician; Juluis Pitch, MD   Note: This dictation was prepared with Dragon dictation along with smaller phrase technology. Any transcriptional errors that  result from this process are unintentional.

## 2019-10-03 ENCOUNTER — Inpatient Hospital Stay: Admit: 2019-10-03 | Payer: Medicare Other

## 2019-10-03 DIAGNOSIS — I272 Pulmonary hypertension, unspecified: Secondary | ICD-10-CM

## 2019-10-03 LAB — HEMOGLOBIN A1C
Hgb A1c MFr Bld: 6.3 % — ABNORMAL HIGH (ref 4.8–5.6)
Mean Plasma Glucose: 134.11 mg/dL

## 2019-10-03 LAB — CBC WITH DIFFERENTIAL/PLATELET
Abs Immature Granulocytes: 0.01 10*3/uL (ref 0.00–0.07)
Basophils Absolute: 0 10*3/uL (ref 0.0–0.1)
Basophils Relative: 0 %
Eosinophils Absolute: 0.1 10*3/uL (ref 0.0–0.5)
Eosinophils Relative: 1 %
HCT: 31.8 % — ABNORMAL LOW (ref 36.0–46.0)
Hemoglobin: 10 g/dL — ABNORMAL LOW (ref 12.0–15.0)
Immature Granulocytes: 0 %
Lymphocytes Relative: 16 %
Lymphs Abs: 1.1 10*3/uL (ref 0.7–4.0)
MCH: 28.2 pg (ref 26.0–34.0)
MCHC: 31.4 g/dL (ref 30.0–36.0)
MCV: 89.6 fL (ref 80.0–100.0)
Monocytes Absolute: 0.7 10*3/uL (ref 0.1–1.0)
Monocytes Relative: 10 %
Neutro Abs: 4.8 10*3/uL (ref 1.7–7.7)
Neutrophils Relative %: 73 %
Platelets: 199 10*3/uL (ref 150–400)
RBC: 3.55 MIL/uL — ABNORMAL LOW (ref 3.87–5.11)
RDW: 15.9 % — ABNORMAL HIGH (ref 11.5–15.5)
WBC: 6.7 10*3/uL (ref 4.0–10.5)
nRBC: 0 % (ref 0.0–0.2)

## 2019-10-03 LAB — BRAIN NATRIURETIC PEPTIDE: B Natriuretic Peptide: 634.9 pg/mL — ABNORMAL HIGH (ref 0.0–100.0)

## 2019-10-03 LAB — BASIC METABOLIC PANEL
Anion gap: 8 (ref 5–15)
BUN: 38 mg/dL — ABNORMAL HIGH (ref 8–23)
CO2: 27 mmol/L (ref 22–32)
Calcium: 8.7 mg/dL — ABNORMAL LOW (ref 8.9–10.3)
Chloride: 107 mmol/L (ref 98–111)
Creatinine, Ser: 2.08 mg/dL — ABNORMAL HIGH (ref 0.44–1.00)
GFR calc Af Amer: 24 mL/min — ABNORMAL LOW (ref >60)
GFR calc non Af Amer: 20 mL/min — ABNORMAL LOW (ref >60)
Glucose, Bld: 127 mg/dL — ABNORMAL HIGH (ref 70–99)
Potassium: 3.8 mmol/L (ref 3.5–5.1)
Sodium: 142 mmol/L (ref 135–145)

## 2019-10-03 LAB — GLUCOSE, CAPILLARY
Glucose-Capillary: 122 mg/dL — ABNORMAL HIGH (ref 70–99)
Glucose-Capillary: 147 mg/dL — ABNORMAL HIGH (ref 70–99)
Glucose-Capillary: 95 mg/dL (ref 70–99)
Glucose-Capillary: 119 mg/dL — ABNORMAL HIGH (ref 70–99)

## 2019-10-03 LAB — MRSA PCR SCREENING: MRSA by PCR: NEGATIVE

## 2019-10-03 LAB — TROPONIN I (HIGH SENSITIVITY): Troponin I (High Sensitivity): 10 ng/L (ref <18)

## 2019-10-03 MED ORDER — COLCHICINE 0.6 MG PO TABS: 0.6000 mg | ORAL_TABLET | Freq: Every day | ORAL | Status: DC | PRN

## 2019-10-03 MED ORDER — FUROSEMIDE 40 MG PO TABS
40.0000 mg | ORAL_TABLET | Freq: Every day | ORAL | Status: DC
  Administered 2019-10-04: 40 mg via ORAL
  Filled 2019-10-03: qty 1

## 2019-10-03 NOTE — ED Notes (Signed)
Ate all her breakfast. Has not been on oxygen. Pulse ox 90-92%

## 2019-10-03 NOTE — Consult Note (Signed)
Cardiology Consultation Note    Patient ID: Destiny Oliver, MRN: [id number], DOB/AGE: 84-Jan-1928 84 y.o. Admit date: 10/02/2019   Date of Consult: 10/03/2019 Primary Physician: Juluis Pitch, MD Primary Cardiologist: Dr. Nehemiah Massed  Chief Complaint: sob Reason for Consultation: Duanne Limerick Requesting MD: Dr. Eppie Gibson  HPI: Destiny Oliver is a 86 y.o. female with history of diastolic CHF, hypertension, dyslipidemia, type 2 diabetes mellitus, hypothyroidism and stage IV chronic kidney disease, who presented to the emergency room with acute onset of worsening dyspnea over the last couple of weeks as well as worsening lower extremity edema.  She admits to orthopnea and paroxysmal nocturnal dyspnea as well as dyspnea on exertion.    She was satting at 91% on room air on presentation.  Her BNP was 579.  She had acute on chronic renal insufficiency and her high-sensitivity troponin initially was 9 with subsequent value of 10.  She has ruled out for myocardial infarction.  Chest x-ray revealed trace bilateral effusions with interlobular septal thickening.  Chest CT revealed no acute airspace disease.  EKG showed sinus bradycardia with no ischemia.  She was given IV Lasix with some improvement.  She is a difficult historian.  She currently denies chest pain or shortness of breath.  Renal function has improved somewhat this morning with a creatinine of 2.08.  Her baseline appears to be 1.5-2.  Echocardiogram done 2 days ago in the office revealed an EF of 55% with severe TR pulmonary hypertension.  Moderate MR.  Past Medical History:  Diagnosis Date  . Breast cancer ([place]) 1994   right breast  . CKD (chronic kidney disease)   . Hypertension   . Tachycardia       Surgical History:  Past Surgical History:  Procedure Laterality Date  . MASTECTOMY Right 1994     Home Meds: Prior to Admission medications   Medication Sig Start Date End Date Taking? Authorizing Provider  allopurinol (ZYLOPRIM) 100 MG  tablet Take 100 mg by mouth daily. 09/12/19  Yes [provider]  amiodarone (PACERONE) 200 MG tablet Take 1 tablet (200 mg total) by mouth daily. 04/30/18  Yes Demetrios Loll, MD  apixaban (ELIQUIS) 2.5 MG TABS tablet Take 1 tablet (2.5 mg total) by mouth 2 (two) times daily. 04/30/18  Yes Demetrios Loll, MD  cholecalciferol (VITAMIN D3) 25 MCG (1000 UT) tablet Take 1,000 Units by mouth daily.   Yes [provider]  colchicine 0.6 MG tablet  05/20/19  Yes [provider]  cyanocobalamin ([employer]S VITAMIN B12) 1000 MCG tablet Take 1,000 mcg by mouth daily.    Yes [provider]  docusate sodium (COLACE) 100 MG capsule Take 100 mg by mouth daily.   Yes [provider]  Lactobacillus TABS Take by mouth.   Yes [provider]  levothyroxine (SYNTHROID) 75 MCG tablet Take 75 mcg by mouth daily. 10/02/19  Yes [provider]  metoprolol succinate (TOPROL-XL) 100 MG 24 hr tablet Take 100 mg by mouth daily.  07/08/15  Yes [provider]  Multiple Vitamins-Minerals (HAIR SKIN AND NAILS FORMULA) TABS Take 1 tablet by mouth daily.   Yes [provider]  Nutritional Supplements (ENSURE ENLIVE PO) Give by mouth two times daily between meals 04/30/18  Yes [provider]  polyethylene glycol powder (GLYCOLAX/MIRALAX) 17 GM/SCOOP powder Take by mouth.   Yes [provider]  potassium chloride SA (KLOR-CON) 20 MEQ tablet Take 20 mEq by mouth daily. 09/12/19  Yes [provider]  pravastatin (PRAVACHOL) 40 MG tablet Take 40 mg by mouth every other day. 02/08/15  Yes [provider]  torsemide (DEMADEX) 20 MG tablet Take 20 mg by mouth 2 (two) times daily.  05/17/18  Yes [provider]  diltiazem (TIAZAC) 300 MG 24 hr capsule Take 300 mg by mouth daily.  08/14/19 10/01/19  [provider]  OXYGEN Inhale 2 L/min into the lungs continuous. 04/30/18   [provider]    Inpatient Medications:   . allopurinol  100 mg Oral Daily  . amiodarone  200 mg Oral Daily  . apixaban  2.5 mg Oral BID  . aspirin EC  81 mg Oral Daily  . cholecalciferol  1,000 Units Oral Daily  . colchicine  0.6 mg Oral Daily  . docusate sodium  100 mg Oral Daily  . feeding supplement (ENSURE ENLIVE)  237 mL Oral BID BM  . furosemide  40 mg Intravenous Q12H  . hydrALAZINE  10 mg Oral TID  . insulin aspart  0-15 Units Subcutaneous TID PC & HS  . levothyroxine  75 mcg Oral Daily  . metoprolol succinate  100 mg Oral Daily  . multivitamin with minerals  1 tablet Oral Daily  . pravastatin  40 mg Oral QODAY  . sodium chloride flush  3 mL Intravenous Q12H  . cyanocobalamin  1,000 mcg Oral Daily   . sodium chloride      Allergies:  Allergies  Allergen Reactions  . Penicillins Swelling    Social History   Socioeconomic History  . Marital status: Married    Spouse name: Not on file  . Number of children: Not on file  . Years of education: Not on file  . Highest education level: Not on file  Occupational History  . Not on file  Tobacco Use  . Smoking status: Never Smoker  . Smokeless tobacco: Never Used  Substance and Sexual Activity  . Alcohol use: Never  . Drug use: Never  . Sexual activity: Not on file  Other Topics Concern  . Not on file  Social History Narrative  . Not on file   Social Determinants of Health   Financial Resource Strain:   . Difficulty of Paying Living Expenses:   Food Insecurity:   . Worried About Charity fundraiser in the Last Year:   . Arboriculturist in the Last Year:   Transportation Needs:   . Film/video editor (Medical):   Marland Kitchen Lack of Transportation (Non-Medical):   Physical Activity:   . Days of Exercise per Week:   . Minutes of Exercise per Session:   Stress:   . Feeling of Stress :   Social Connections:   . Frequency of Communication with Friends and Family:   . Frequency of Social Gatherings with Friends and Family:   . Attends Religious  Services:   . Active Member of Clubs or Organizations:   . Attends Archivist Meetings:   Marland Kitchen Marital Status:   Intimate Partner Violence:   . Fear of Current or Ex-Partner:   . Emotionally Abused:   Marland Kitchen Physically Abused:   . Sexually Abused:      No family history on file.   Review of Systems: A 12-system review of systems was performed and is negative except as noted in the HPI.  Labs: No results for input(s): CKTOTAL, CKMB, TROPONINI in the last 72 hours. Lab Results  Component Value Date   WBC 6.7 10/03/2019   HGB 10.0 (  L) 10/03/2019   HCT 31.8 (L) 10/03/2019   MCV 89.6 10/03/2019   PLT 199 10/03/2019    Recent Labs  Lab 10/03/19 0449  NA 142  K 3.8  CL 107  CO2 27  BUN 38*  CREATININE 2.08*  CALCIUM 8.7*  GLUCOSE 127*   No results found for: CHOL, HDL, LDLCALC, TRIG No results found for: DDIMER  Radiology/Studies:  DG Chest 2 View  Result Date: 10/02/2019 CLINICAL DATA:  86 year old female with shortness of breath. EXAM: CHEST - 2 VIEW COMPARISON:  Chest radiograph dated 04/26/2018. FINDINGS: There is diffuse interstitial coarsening and bronchitic changes. Left mid to lower lung field interstitial and streaky densities may represent combination of atelectasis and infiltrate. Clinical correlation is recommended. No large pleural effusion or pneumothorax. Stable cardiomegaly. Atherosclerotic calcification of the aorta. No acute osseous pathology. Degenerative changes of the spine. IMPRESSION: Left mid to lower lung field atelectasis densities concerning for pneumonia. Clinical correlation is recommended. Electronically Signed   By: Anner Crete M.D.   On: 10/02/2019 17:46   CT Chest Wo Contrast  Result Date: 10/02/2019 CLINICAL DATA:  Shortness of breath for several weeks, bilateral lower extremity edema, wheezing EXAM: CT CHEST WITHOUT CONTRAST TECHNIQUE: Multidetector CT imaging of the chest was performed following the standard protocol without IV  contrast. COMPARISON:  10/02/2019, 04/27/2018 FINDINGS: Cardiovascular: Borderline cardiomegaly unchanged. No pericardial effusion. There is extensive atherosclerosis of the thoracic aorta, stable. Minimal atherosclerosis of the LAD distribution of the coronary vasculature. Mediastinum/Nodes: Stable borderline enlarged mediastinal lymph nodes, largest in the precarinal region measuring 13 mm in short axis reference image 35/4. Thyroid, trachea, and esophagus are stable. Lungs/Pleura: There is diffuse interlobular septal thickening. Scattered mosaic attenuation of the lungs consistent with areas of air trapping. Scattered areas of scarring are seen primarily at the lung bases. No focal airspace disease. Trace bilateral pleural effusions. No pneumothorax. The central airways are patent. Upper Abdomen: No acute abnormality. Musculoskeletal: No acute or destructive bony lesions. Reconstructed images demonstrate no additional findings. IMPRESSION: 1. Bibasilar scarring, with scattered areas of air trapping consistent with reactive airway disease. 2. Cardiomegaly with mild interlobular septal thickening and trace bilateral effusions could reflect an element of mild interstitial edema. 3. No acute airspace disease. 4.  Aortic Atherosclerosis (ICD10-I70.0). 5. Stable borderline enlarged mediastinal lymph nodes, likely reactive. Electronically Signed   By: Randa Ngo M.D.   On: 10/02/2019 18:37    Wt Readings from Last 3 Encounters:  10/02/19 97.5 kg  05/28/18 85.5 kg  05/23/18 83.3 kg    EKG: Sinus bradycardia with no ischemia  Physical Exam:  Blood pressure (!) 145/65, pulse 61, temperature 98 F (36.7 C), resp. rate 20, height 5\' 1"  (1.549 m), weight 97.5 kg, SpO2 94 %. Body mass index is 40.62 kg/m. General: Well developed, well nourished, in no acute distress. Head: Normocephalic, atraumatic, sclera non-icteric, no xanthomas, nares are without discharge.  Neck: Negative for carotid bruits. JVD not  elevated. Lungs: Clear bilaterally to auscultation without wheezes, rales, or rhonchi. Breathing is unlabored. Heart: RRR with S1 S2.  2/6 systolic murmur. Abdomen: Soft, non-tender, non-distended with normoactive bowel sounds. No hepatomegaly. No rebound/guarding. No obvious abdominal masses. Msk:  Strength and tone appear normal for age. Extremities: No clubbing or cyanosis. No edema.  Distal pedal pulses are 2+ and equal bilaterally. Neuro: Alert and oriented. No facial asymmetry. No focal deficit. Moves all extremities spontaneously.      Assessment and Plan  86 year old female with history of diastolic  heart failure with preserved LV function, history of pulmonary hypertension, diabetes, stage IV chronic kidney disease who presented with worsening dyspnea and lower extremity edema.  She had an echo 2 days ago showing significant pulmonary hypertension with TR.  There is no significant left heart failure noted on chest x-ray or CT of the chest.  Echocardiogram done in 2019 revealed preserved LV function with with less severe TR.  1.  Atrial fibrillation-appears to be in sinus rhythm at present.  Is currently anticoagulated with Eliquis and has been so since 2019 at 2.5 mg twice daily given her renal function.  Changes controlled with amiodarone at 200 mg daily  and metoprolol succinate 100 mg daily.  Would continue with apixaban and amiodarone.  Bradycardia does not appear to be symptomatic.  Consideration for reducing metoprolol  could be raised however given lack of symptoms from this would continue for now.  2.  Pulmonary hypertension-not previously noted.  Patient is anticoagulated which makes pulmonary embolus less likely.  Given renal function, patient not an ideal candidate for CT angiogram.  Given the fact she is already anticoagulated will defer VQ scan at present.  This may need to be considered future.  Will likely need consideration for outpatient follow-up with pulmonary however  given advanced age, treatment options are limited for this.  Nasal cannula oxygen appears appropriate.  3.  Hyperlipidemia-continue with pravastatin at 40 mg daily with an LDL goal of around 100.  4.  HFpEF-preserved LV function by echo 2 days ago.  No obvious pulmonary edema on chest x-ray.  Will be careful with diuresis following renal function, hemodynamics and electrolytes.  We will repeat BNP.   Signed, Teodoro Spray MD 10/03/2019, 8:43 AM Pager: [phone]

## 2019-10-03 NOTE — Evaluation (Signed)
Physical Therapy Evaluation Patient Details Name: Destiny Oliver MRN: [id number] DOB: January 31, 1927 Today's Date: 10/03/2019   History of Present Illness  Pt admitted for acute on chronic CHF with complaints of SOB symptoms. PMH includes CHF, HTN, and DM. Currently lives at [facility]>  Clinical Impression  Pt is a pleasant 86 year old female who was admitted for acute on chronic CHF with SOB symptoms. Pt performs bed mobility with independence, transfers with mod I, and ambulation with supervision. Pt demonstrates deficits with endurance/mobility. All mobility performed on RA with sats WNL. Would benefit from skilled PT to address above deficits and promote optimal return to PLOF. Recommend transition to [place] upon discharge from acute hospitalization.     Follow Up Recommendations Home health PT    Equipment Recommendations       Recommendations for Other Services       Precautions / Restrictions Precautions Precautions: Fall Restrictions Weight Bearing Restrictions: No      Mobility  Bed Mobility Overal bed mobility: Independent             General bed mobility comments: safe technique  Transfers Overall transfer level: Modified independent Equipment used: Rolling walker (2 wheeled)             General transfer comment: upright posture. Incontient epsiode while standing. Needed diaper donned for mobility  Ambulation/Gait Ambulation/Gait assistance: Supervision Gait Distance (Feet): 160 Feet Assistive device: Rolling walker (2 wheeled) Gait Pattern/deviations: Step-through pattern     General Gait Details: ambulated with RW with safe technique. All mobility performed on RA with sats at 91% with exertion.  Stairs            Wheelchair Mobility    Modified Rankin (Stroke Patients Only)       Balance Overall balance assessment: Modified Independent;Mild deficits observed, not formally tested                                            Pertinent Vitals/Pain Pain Assessment: No/denies pain    Home Living Family/patient expects to be discharged to:: Assisted living               Home Equipment: Walker - 2 wheels      Prior Function Level of Independence: Independent with assistive device(s)         Comments: uses RW primarily     Hand Dominance        Extremity/Trunk Assessment   Upper Extremity Assessment Upper Extremity Assessment: Overall WFL for tasks assessed    Lower Extremity Assessment Lower Extremity Assessment: Overall WFL for tasks assessed       Communication   Communication: No difficulties  Cognition Arousal/Alertness: Awake/alert Behavior During Therapy: WFL for tasks assessed/performed Overall Cognitive Status: Within Functional Limits for tasks assessed                                        General Comments      Exercises Other Exercises Other Exercises: supine/seated ther-ex performed including B LE alt. marching, LAQ, and AP. All ther-ex performed x 10 reps with supervision   Assessment/Plan    PT Assessment Patient needs continued PT services  PT Problem List Decreased balance;Decreased mobility       PT Treatment Interventions Gait training;Therapeutic  exercise;Balance training    PT Goals (Current goals can be found in the Care Plan section)  Acute Rehab PT Goals Patient Stated Goal: to go home PT Goal Formulation: With patient Time For Goal Achievement: 10/17/19 Potential to Achieve Goals: Good    Frequency Min 2X/week   Barriers to discharge        Co-evaluation               AM-PAC PT "6 Clicks" Mobility  Outcome Measure Help needed turning from your back to your side while in a flat bed without using bedrails?: None Help needed moving from lying on your back to sitting on the side of a flat bed without using bedrails?: None Help needed moving to and from a bed to a chair (including a wheelchair)?: None Help  needed standing up from a chair using your arms (e.g., wheelchair or bedside chair)?: None Help needed to walk in hospital room?: None Help needed climbing 3-5 steps with a railing? : None 6 Click Score: 24    End of Session Equipment Utilized During Treatment: Gait belt Activity Tolerance: Patient tolerated treatment well Patient left: in bed;with bed alarm set Nurse Communication: Mobility status PT Visit Diagnosis: Difficulty in walking, not elsewhere classified (R26.2)    Time: 0349-1791 PT Time Calculation (min) (ACUTE ONLY): 25 min   Charges:   PT Evaluation $PT Eval Moderate Complexity: 1 Mod PT Treatments $Therapeutic Exercise: 8-22 mins        Greggory Stallion, PT, DPT [phone]   Darron Stuck 10/03/2019, 4:45 PM

## 2019-10-03 NOTE — Progress Notes (Signed)
Patient admitted to unit. Oriented to room, call bell, and staff. Bed in lowest position. Fall safety plan reviewed. Full assessment to Epic. Skin assessment verified with Destiny Oliver Telemetry box verification with tele clerk- Box#: [phone]. Unable to do RedSVest reading due to MI (39.47)  Will continue to monitor.

## 2019-10-03 NOTE — Progress Notes (Signed)
PROGRESS NOTE    Destiny Oliver  LYY:[id number] DOB: 05-10-1926 DOA: 10/02/2019 PCP: Juluis Pitch, MD    Assessment & Plan:   Active Problems:   Acute on chronic diastolic CHF (congestive heart failure) (HCC)   Acute on chronic diastolic CHF: continue on IV lasix. Monitor I/Os and daily weights. Continue on tele. Cardio consulted and recs apprec  Pulmonary HTN: no acute intervention necessary as per cardio   AKI vs CKD: baseline Cr is unknown. Cr is trending down today. Will continue to monitor   HTN: continue on metoprolol, hydralazine   PAF: continue on amiodarone, metoprolol & eliquis   Hypothyroidism: continue on levothyroxine   Dyslipidemia: continue on statin   Gout:  continue allopurinol and colchicine.  DM2: will continue on SSI w/ accuchecks   Generalized weakness: PT/OT consulted DVT prophylaxis: eliquis Code Status: DNR Family Communication:  Disposition Plan: depends on PT/OT recs   Consultants:   Cardio    Procedures:    Antimicrobials:    Subjective: Pt c/o shortness of breath   Objective: Vitals:   10/02/19 2303 10/03/19 0300 10/03/19 0430 10/03/19 0530  BP: 138/62 132/60 139/63 (!) 145/65  Pulse:  (!) 52 62 61  Resp:  20 (!) 21 20  Temp:      SpO2:  95% 98% 94%  Weight:      Height:        Intake/Output Summary (Last 24 hours) at 10/03/2019 0819 Last data filed at 10/03/2019 0723 Gross per 24 hour  Intake --  Output 1000 ml  Net -1000 ml   Filed Weights   10/02/19 1648  Weight: 97.5 kg    Examination:  General exam: Appears calm and comfortable  Respiratory system: diminished breath sounds b/l  Cardiovascular system: irregularly irregular. No rubs, gallops or clicks.  Gastrointestinal system: Abdomen is nondistended, soft and nontender.  Normal bowel sounds heard. Central nervous system: Alert and oriented. Moves all 4 extremities Psychiatry: Judgement and insight appear normal. Flat mood and affect     Data  Reviewed: I have personally reviewed following labs and imaging studies  CBC: Recent Labs  Lab 10/02/19 1652 10/03/19 0449  WBC 5.8 6.7  NEUTROABS  --  4.8  HGB 10.6* 10.0*  HCT 33.9* 31.8*  MCV 89.7 89.6  PLT 215 127   Basic Metabolic Panel: Recent Labs  Lab 10/02/19 1652 10/03/19 0449  NA 140 142  K 3.9 3.8  CL 108 107  CO2 23 27  GLUCOSE 143* 127*  BUN 41* 38*  CREATININE 2.32* 2.08*  CALCIUM 8.8* 8.7*   GFR: Estimated Creatinine Clearance: 18.8 mL/min (A) (by C-G formula based on SCr of 2.08 mg/dL (H)). Liver Function Tests: No results for input(s): AST, ALT, ALKPHOS, BILITOT, PROT, ALBUMIN in the last 168 hours. No results for input(s): LIPASE, AMYLASE in the last 168 hours. No results for input(s): AMMONIA in the last 168 hours. Coagulation Profile: No results for input(s): INR, PROTIME in the last 168 hours. Cardiac Enzymes: No results for input(s): CKTOTAL, CKMB, CKMBINDEX, TROPONINI in the last 168 hours. BNP (last 3 results) No results for input(s): PROBNP in the last 8760 hours. HbA1C: No results for input(s): HGBA1C in the last 72 hours. CBG: Recent Labs  Lab 10/02/19 2307  GLUCAP 117*   Lipid Profile: No results for input(s): CHOL, HDL, LDLCALC, TRIG, CHOLHDL, LDLDIRECT in the last 72 hours. Thyroid Function Tests: Recent Labs    10/02/19 2027  TSH 7.885*   Anemia Panel: No results  for input(s): VITAMINB12, FOLATE, FERRITIN, TIBC, IRON, RETICCTPCT in the last 72 hours. Sepsis Labs: No results for input(s): PROCALCITON, LATICACIDVEN in the last 168 hours.  Recent Results (from the past 240 hour(s))  SARS Coronavirus 2 by RT PCR (hospital order, performed in [facility] hospital lab) Nasopharyngeal Nasopharyngeal Swab     Status: None   Collection Time: 10/02/19  8:05 PM   Specimen: Nasopharyngeal Swab  Result Value Ref Range Status   SARS Coronavirus 2 NEGATIVE NEGATIVE Final    Comment: (NOTE) SARS-CoV-2 target nucleic acids are NOT  DETECTED. The SARS-CoV-2 RNA is generally detectable in upper and lower respiratory specimens during the acute phase of infection. The lowest concentration of SARS-CoV-2 viral copies this assay can detect is 250 copies / mL. A negative result does not preclude SARS-CoV-2 infection and should not be used as the sole basis for treatment or other patient management decisions.  A negative result may occur with improper specimen collection / handling, submission of specimen other than nasopharyngeal swab, presence of viral mutation(s) within the areas targeted by this assay, and inadequate number of viral copies (<250 copies / mL). A negative result must be combined with clinical observations, patient history, and epidemiological information. Fact Sheet for Patients:   StrictlyIdeas.no Fact Sheet for Healthcare Providers: BankingDealers.co.za This test is not yet approved or cleared  by the [place] FDA and has been authorized for detection and/or diagnosis of SARS-CoV-2 by FDA under an Emergency Use Authorization (EUA).  This EUA will remain in effect (meaning this test can be used) for the duration of the COVID-19 declaration under Section 564(b)(1) of the Act, 21 U.S.C. section 360bbb-3(b)(1), unless the authorization is terminated or revoked sooner. Performed at [facility], [address], [place], [place] [address]          Radiology Studies: DG Chest 2 View  Result Date: 10/02/2019 CLINICAL DATA:  86 year old female with shortness of breath. EXAM: CHEST - 2 VIEW COMPARISON:  Chest radiograph dated 04/26/2018. FINDINGS: There is diffuse interstitial coarsening and bronchitic changes. Left mid to lower lung field interstitial and streaky densities may represent combination of atelectasis and infiltrate. Clinical correlation is recommended. No large pleural effusion or pneumothorax. Stable cardiomegaly. Atherosclerotic  calcification of the aorta. No acute osseous pathology. Degenerative changes of the spine. IMPRESSION: Left mid to lower lung field atelectasis densities concerning for pneumonia. Clinical correlation is recommended. Electronically Signed   By: Anner Crete M.D.   On: 10/02/2019 17:46   CT Chest Wo Contrast  Result Date: 10/02/2019 CLINICAL DATA:  Shortness of breath for several weeks, bilateral lower extremity edema, wheezing EXAM: CT CHEST WITHOUT CONTRAST TECHNIQUE: Multidetector CT imaging of the chest was performed following the standard protocol without IV contrast. COMPARISON:  10/02/2019, 04/27/2018 FINDINGS: Cardiovascular: Borderline cardiomegaly unchanged. No pericardial effusion. There is extensive atherosclerosis of the thoracic aorta, stable. Minimal atherosclerosis of the LAD distribution of the coronary vasculature. Mediastinum/Nodes: Stable borderline enlarged mediastinal lymph nodes, largest in the precarinal region measuring 13 mm in short axis reference image 35/4. Thyroid, trachea, and esophagus are stable. Lungs/Pleura: There is diffuse interlobular septal thickening. Scattered mosaic attenuation of the lungs consistent with areas of air trapping. Scattered areas of scarring are seen primarily at the lung bases. No focal airspace disease. Trace bilateral pleural effusions. No pneumothorax. The central airways are patent. Upper Abdomen: No acute abnormality. Musculoskeletal: No acute or destructive bony lesions. Reconstructed images demonstrate no additional findings. IMPRESSION: 1. Bibasilar scarring, with scattered areas  of air trapping consistent with reactive airway disease. 2. Cardiomegaly with mild interlobular septal thickening and trace bilateral effusions could reflect an element of mild interstitial edema. 3. No acute airspace disease. 4.  Aortic Atherosclerosis (ICD10-I70.0). 5. Stable borderline enlarged mediastinal lymph nodes, likely reactive. Electronically Signed   By:  Randa Ngo M.D.   On: 10/02/2019 18:37        Scheduled Meds: . allopurinol  100 mg Oral Daily  . amiodarone  200 mg Oral Daily  . apixaban  2.5 mg Oral BID  . aspirin EC  81 mg Oral Daily  . cholecalciferol  1,000 Units Oral Daily  . colchicine  0.6 mg Oral Daily  . docusate sodium  100 mg Oral Daily  . feeding supplement (ENSURE ENLIVE)  237 mL Oral BID BM  . furosemide  40 mg Intravenous Q12H  . hydrALAZINE  10 mg Oral TID  . insulin aspart  0-15 Units Subcutaneous TID PC & HS  . levothyroxine  75 mcg Oral Daily  . metoprolol succinate  100 mg Oral Daily  . multivitamin with minerals  1 tablet Oral Daily  . pravastatin  40 mg Oral QODAY  . sodium chloride flush  3 mL Intravenous Q12H  . cyanocobalamin  1,000 mcg Oral Daily   Continuous Infusions: . sodium chloride       LOS: 1 day    Time spent: 33 mins     Wyvonnia Dusky, MD [facility] Pager [phone]  If 7PM-7AM, please contact night-coverage [email or web address] 10/03/2019, 8:19 AM

## 2019-10-03 NOTE — ED Notes (Signed)
Colchicine, amiodarone, diltiazem, metoprolol, and colace not given. Not yet cleared by pharmacy.

## 2019-10-04 LAB — CBC
HCT: 32.4 % — ABNORMAL LOW (ref 36.0–46.0)
Hemoglobin: 10.3 g/dL — ABNORMAL LOW (ref 12.0–15.0)
MCH: 28 pg (ref 26.0–34.0)
MCHC: 31.8 g/dL (ref 30.0–36.0)
MCV: 88 fL (ref 80.0–100.0)
Platelets: 197 10*3/uL (ref 150–400)
RBC: 3.68 MIL/uL — ABNORMAL LOW (ref 3.87–5.11)
RDW: 16.2 % — ABNORMAL HIGH (ref 11.5–15.5)
WBC: 6.8 10*3/uL (ref 4.0–10.5)
nRBC: 0 % (ref 0.0–0.2)

## 2019-10-04 LAB — GLUCOSE, CAPILLARY
Glucose-Capillary: 106 mg/dL — ABNORMAL HIGH (ref 70–99)
Glucose-Capillary: 87 mg/dL (ref 70–99)
Glucose-Capillary: 118 mg/dL — ABNORMAL HIGH (ref 70–99)
Glucose-Capillary: 109 mg/dL — ABNORMAL HIGH (ref 70–99)

## 2019-10-04 LAB — BASIC METABOLIC PANEL
Anion gap: 9 (ref 5–15)
BUN: 34 mg/dL — ABNORMAL HIGH (ref 8–23)
CO2: 26 mmol/L (ref 22–32)
Calcium: 9 mg/dL (ref 8.9–10.3)
Chloride: 108 mmol/L (ref 98–111)
Creatinine, Ser: 1.61 mg/dL — ABNORMAL HIGH (ref 0.44–1.00)
GFR calc Af Amer: 32 mL/min — ABNORMAL LOW (ref >60)
GFR calc non Af Amer: 28 mL/min — ABNORMAL LOW (ref >60)
Glucose, Bld: 109 mg/dL — ABNORMAL HIGH (ref 70–99)
Potassium: 3.5 mmol/L (ref 3.5–5.1)
Sodium: 143 mmol/L (ref 135–145)

## 2019-10-04 LAB — TROPONIN I (HIGH SENSITIVITY): Troponin I (High Sensitivity): 10 ng/L (ref <18)

## 2019-10-04 MED ORDER — FUROSEMIDE 10 MG/ML IJ SOLN
40.0000 mg | Freq: Every day | INTRAMUSCULAR | Status: DC
  Administered 2019-10-05 – 2019-10-06 (×2): 40 mg via INTRAVENOUS
  Filled 2019-10-04 (×2): qty 4

## 2019-10-04 MED ORDER — METOPROLOL SUCCINATE ER 50 MG PO TB24
50.0000 mg | ORAL_TABLET | Freq: Every day | ORAL | Status: DC
  Administered 2019-10-05 – 2019-10-06 (×2): 50 mg via ORAL
  Filled 2019-10-04 (×2): qty 1

## 2019-10-04 NOTE — Evaluation (Addendum)
Occupational Therapy Evaluation Patient Details Name: Destiny Oliver MRN: [id number] DOB: 1926-07-13 Today's Date: 10/04/2019    History of Present Illness Pt. is a 86 y.o. female who was admitted for acute on chronic CHF with complaints of SOB symptoms. PMH includes CHF, HTN, and DM. Currently lives at [facility]   Pt. presents with weakness, and limited activity tolerance which hinders her ability to complete ADL, and IADL functioning. Pt. resides at [facility].  Pt. was independent with ADLs, and IADLs within the ALF, making her bed, eating at the dining area, and joining in the activities.  Pt. education was provided about energy conservation, and work simplification strategies during ADLs/IADLs. Pt. Reports planning to have [place] services at her ALF prior to her hospital admission, and would like to resume once returning to [facility].  No further OT needs are indicated at this level of care. Pt. will benefit from OT services once returning back to ALF level of care. Will complete the OT orders at this level of care.    Follow Up Recommendations  Home health OT    Equipment Recommendations    None recommended by OT   Recommendations for Other Services       Precautions / Restrictions        Mobility Bed Mobility Overal bed mobility: Independent                Transfers Overall transfer level: Modified independent Equipment used: Rolling walker (2 wheeled)             General transfer comment: Per PT report    Balance                                           ADL either performed or assessed with clinical judgement   ADL Overall ADL's : Needs assistance/impaired Eating/Feeding: Independent   Grooming: Independent   Upper Body Bathing: Set up;Independent   Lower Body Bathing: Set up;Minimal assistance   Upper Body Dressing : Set up;Independent   Lower Body Dressing: Set up;Minimal assistance   Toilet  Transfer: Modified Independent                   Vision Baseline Vision/History: Wears glasses Wears Glasses: At all times       Perception     Praxis      Pertinent Vitals/Pain Pain Assessment: No/denies pain     Hand Dominance Right   Extremity/Trunk Assessment Upper Extremity Assessment Upper Extremity Assessment: Overall WFL for tasks assessed           Communication Communication Communication: No difficulties   Cognition Arousal/Alertness: Awake/alert Behavior During Therapy: WFL for tasks assessed/performed Overall Cognitive Status: Within Functional Limits for tasks assessed                                     General Comments   All OT needs an be met at the next venue of care.    Exercises     Shoulder Instructions      Home Living Family/patient expects to be discharged to:: Assisted living                             Home  Equipment: Gilford Rile - 2 wheels          Prior Functioning/Environment Level of Independence: Independent with assistive device(s)        Comments: Independent with ADLs, IADLs within the ALF, making her bed, eating in the dining hall, and attending activities.        OT Problem List:        OT Treatment/Interventions: Self-care/ADL training;DME and/or AE instruction;Therapeutic activities;Therapeutic exercise;Patient/family education    OT Goals(Current goals can be found in the care plan section) Acute Rehab OT Goals Patient Stated Goal: To retrun to [employer] OT Goal Formulation: With patient Potential to Achieve Goals: Good  OT Frequency:     Barriers to D/C:            Co-evaluation              AM-PAC OT "6 Clicks" Daily Activity     Outcome Measure Help from another person eating meals?: None Help from another person taking care of personal grooming?: None Help from another person toileting, which includes using toliet, bedpan, or urinal?: None Help from  another person bathing (including washing, rinsing, drying)?: A Little Help from another person to put on and taking off regular upper body clothing?: None Help from another person to put on and taking off regular lower body clothing?: A Little 6 Click Score: 22   End of Session Equipment Utilized During Treatment: Gait belt  Activity Tolerance: Patient tolerated treatment well Patient left: in bed  OT Visit Diagnosis: History of falling (Z91.81)                Time: 7915-0569 OT Time Calculation (min): 16 min Charges:  OT General Charges $OT Visit: 1 Visit OT Evaluation $OT Eval Low Complexity: 1 Low  Harrel Carina, MS, OTR/L Harrel Carina 10/04/2019, 2:11 PM

## 2019-10-04 NOTE — Progress Notes (Signed)
Patient Name: Destiny Oliver Date of Encounter: 10/04/2019  Hospital Problem List     Active Problems:   Acute on chronic diastolic CHF (congestive heart failure) [facility])    Patient Profile     86 y.o. female with history of diastolic CHF, hypertension, dyslipidemia, type 2 diabetes mellitus, hypothyroidismand stage IV chronic kidney disease, who presented to the emergency room with acute onset of worsening dyspnea over the last coupleofweeks as well as worsening lower extremity edema. She admits to orthopnea and paroxysmal nocturnal dyspnea as well as dyspnea on exertion.   She was satting at 91% on room air on presentation.  Her BNP was 579.  She had acute on chronic renal insufficiency and her high-sensitivity troponin initially was 9 with subsequent value of 10.  She has ruled out for myocardial infarction.  Chest x-ray revealed trace bilateral effusions with interlobular septal thickening.  Chest CT revealed no acute airspace disease.  EKG showed sinus bradycardia with no ischemia.  She was given IV Lasix with some improvement.  She is a difficult historian.  She currently denies chest pain or shortness of breath.  Renal function has improved somewhat this morning with a creatinine of 2.08.  Her baseline appears to be 1.5-2.  Echocardiogram done 2 days ago in the office revealed an EF of 55% with severe TR pulmonary hypertension.  Moderate MR.  Subjective   States she feels a little better today.   Inpatient Medications    . allopurinol  100 mg Oral Daily  . amiodarone  200 mg Oral Daily  . apixaban  2.5 mg Oral BID  . aspirin EC  81 mg Oral Daily  . cholecalciferol  1,000 Units Oral Daily  . docusate sodium  100 mg Oral Daily  . feeding supplement (ENSURE ENLIVE)  237 mL Oral BID BM  . furosemide  40 mg Oral Daily  . hydrALAZINE  10 mg Oral TID  . insulin aspart  0-15 Units Subcutaneous TID PC & HS  . levothyroxine  75 mcg Oral Daily  . metoprolol succinate  100 mg Oral Daily   . multivitamin with minerals  1 tablet Oral Daily  . pravastatin  40 mg Oral QODAY  . sodium chloride flush  3 mL Intravenous Q12H  . cyanocobalamin  1,000 mcg Oral Daily    Vital Signs    Vitals:   10/03/19 1156 10/03/19 1607 10/03/19 2029 10/04/19 0618  BP: 134/63 (!) 143/49 (!) 152/58 (!) 126/54  Pulse: 66 73 (!) 57 (!) 55  Resp: 18 17    Temp: 98.2 F (36.8 C) 98.1 F (36.7 C) 98.3 F (36.8 C) 97.9 F (36.6 C)  TempSrc: Oral Oral Oral Oral  SpO2: 98% 92% 91% 90%  Weight: 94.7 kg   94.9 kg  Height: 5\' 1"  (1.549 m)       Intake/Output Summary (Last 24 hours) at 10/04/2019 0948 Last data filed at 10/04/2019 9678 Gross per 24 hour  Intake 0 ml  Output 1150 ml  Net -1150 ml   Filed Weights   10/02/19 1648 10/03/19 1156 10/04/19 0618  Weight: 97.5 kg 94.7 kg 94.9 kg    Physical Exam    GEN: Well nourished, well developed, in no acute distress.  HEENT: normal.  Neck: Supple, no JVD, carotid bruits, or masses. Cardiac: RRR, no murmurs, rubs, or gallops. No clubbing, cyanosis, edema.  Radials/DP/PT 2+ and equal bilaterally.  Respiratory:  Respirations regular and unlabored, clear to auscultation bilaterally. GI: Soft, nontender, nondistended,  BS + x 4. MS: no deformity or atrophy. Skin: warm and dry, no rash. Neuro:  Strength and sensation are intact. Psych: Normal affect.  Labs    CBC Recent Labs    10/03/19 0449 10/04/19 0632  WBC 6.7 6.8  NEUTROABS 4.8  --   HGB 10.0* 10.3*  HCT 31.8* 32.4*  MCV 89.6 88.0  PLT 199 702   Basic Metabolic Panel Recent Labs    10/03/19 0449 10/04/19 0632  NA 142 143  K 3.8 3.5  CL 107 108  CO2 27 26  GLUCOSE 127* 109*  BUN 38* 34*  CREATININE 2.08* 1.61*  CALCIUM 8.7* 9.0   Liver Function Tests No results for input(s): AST, ALT, ALKPHOS, BILITOT, PROT, ALBUMIN in the last 72 hours. No results for input(s): LIPASE, AMYLASE in the last 72 hours. Cardiac Enzymes No results for input(s): CKTOTAL, CKMB, CKMBINDEX,  TROPONINI in the last 72 hours. BNP Recent Labs    10/02/19 1652 10/03/19 1151  BNP 579.4* 634.9*   D-Dimer No results for input(s): DDIMER in the last 72 hours. Hemoglobin A1C Recent Labs    10/03/19 0449  HGBA1C 6.3*   Fasting Lipid Panel No results for input(s): CHOL, HDL, LDLCALC, TRIG, CHOLHDL, LDLDIRECT in the last 72 hours. Thyroid Function Tests Recent Labs    10/02/19 2027  TSH 7.885*    Telemetry    Sinus brady  ECG    Sinus bradycardia  Radiology    DG Chest 2 View  Result Date: 10/02/2019 CLINICAL DATA:  86 year old female with shortness of breath. EXAM: CHEST - 2 VIEW COMPARISON:  Chest radiograph dated 04/26/2018. FINDINGS: There is diffuse interstitial coarsening and bronchitic changes. Left mid to lower lung field interstitial and streaky densities may represent combination of atelectasis and infiltrate. Clinical correlation is recommended. No large pleural effusion or pneumothorax. Stable cardiomegaly. Atherosclerotic calcification of the aorta. No acute osseous pathology. Degenerative changes of the spine. IMPRESSION: Left mid to lower lung field atelectasis densities concerning for pneumonia. Clinical correlation is recommended. Electronically Signed   By: Anner Crete M.D.   On: 10/02/2019 17:46   CT Chest Wo Contrast  Result Date: 10/02/2019 CLINICAL DATA:  Shortness of breath for several weeks, bilateral lower extremity edema, wheezing EXAM: CT CHEST WITHOUT CONTRAST TECHNIQUE: Multidetector CT imaging of the chest was performed following the standard protocol without IV contrast. COMPARISON:  10/02/2019, 04/27/2018 FINDINGS: Cardiovascular: Borderline cardiomegaly unchanged. No pericardial effusion. There is extensive atherosclerosis of the thoracic aorta, stable. Minimal atherosclerosis of the LAD distribution of the coronary vasculature. Mediastinum/Nodes: Stable borderline enlarged mediastinal lymph nodes, largest in the precarinal region measuring  13 mm in short axis reference image 35/4. Thyroid, trachea, and esophagus are stable. Lungs/Pleura: There is diffuse interlobular septal thickening. Scattered mosaic attenuation of the lungs consistent with areas of air trapping. Scattered areas of scarring are seen primarily at the lung bases. No focal airspace disease. Trace bilateral pleural effusions. No pneumothorax. The central airways are patent. Upper Abdomen: No acute abnormality. Musculoskeletal: No acute or destructive bony lesions. Reconstructed images demonstrate no additional findings. IMPRESSION: 1. Bibasilar scarring, with scattered areas of air trapping consistent with reactive airway disease. 2. Cardiomegaly with mild interlobular septal thickening and trace bilateral effusions could reflect an element of mild interstitial edema. 3. No acute airspace disease. 4.  Aortic Atherosclerosis (ICD10-I70.0). 5. Stable borderline enlarged mediastinal lymph nodes, likely reactive. Electronically Signed   By: Randa Ngo M.D.   On: 10/02/2019 18:37    Assessment &  Plan    86 year old female with history of diastolic heart failure with preserved LV function, history of pulmonary hypertension, diabetes, stage IV chronic kidney disease who presented with worsening dyspnea and lower extremity edema.  She had an echo 2 days ago showing significant pulmonary hypertension with TR.  There is no significant left heart failure noted on chest x-ray or CT of the chest.  Echocardiogram done in 2019 revealed preserved LV function with with less severe TR.  1.  Atrial fibrillation-appears to be in sinus rhythm at present.  Is currently anticoagulated with Eliquis and has been so since 2019 at 2.5 mg twice daily given her renal function.  Changes controlled with amiodarone at 200 mg daily  and metoprolol succinate 100 mg daily.  Would continue with apixaban and amiodarone.  Bradycardia does not appear to be symptomatic.  Will reduce metoprolol to 50 mg succ daily  and follow pressure and rate. Will d/c asa since she is on elizuis.   2.  Pulmonary hypertension-not previously noted.  Patient is anticoagulated which makes pulmonary embolus less likely.  Given renal function, patient not an ideal candidate for CT angiogram.  Given the fact she is already anticoagulated will defer VQ scan at present.  This may need to be considered future.  Will likely need consideration for outpatient follow-up with pulmonary however given advanced age, treatment options are limited for this.  Nasal cannula oxygen appears appropriate.  3.  Hyperlipidemia-continue with pravastatin at 40 mg daily with an LDL goal of around 100.  4.  HFpEF-preserved LV function by echo 2 days ago.  No obvious pulmonary edema on chest x-ray.  Will be careful with diuresis following renal function, hemodynamics and electrolytes. BNP slightly higher than admission. Will need to continue diuresis and follow hemodynamics, electrolytes, symptoms and BNP. Will increase the lasix to iv to improve diuresis. Creatinine is improved from admission. Will need to follow sbp which is somewhat soft.    Signed, Javier Docker Adeola Dennen MD 10/04/2019, 9:48 AM  Pager: [phone]

## 2019-10-04 NOTE — Progress Notes (Signed)
PROGRESS NOTE    Destiny Oliver  BOF:[id number] DOB: 1926/11/27 DOA: 10/02/2019 PCP: Juluis Pitch, MD    Assessment & Plan:   Active Problems:   Acute on chronic diastolic CHF (congestive heart failure) (HCC)   Acute on chronic diastolic CHF: continue on IV lasix. Monitor I/Os and daily weights. Continue on tele. Cardio consulted and recs apprec  Pulmonary HTN: no acute intervention necessary as per cardio   AKI vs CKD: baseline Cr is unknown. Cr continues to trend down daily. Will continue to monitor   HTN: continue on metoprolol, hydralazine   PAF: continue on amiodarone, eliquis & a reduced dose of metoprolol as per cardio  Hypothyroidism: continue on levothyroxine   Dyslipidemia: continue on statin   Gout:  continue allopurinol and colchicine.  DM2: will continue on SSI w/ accuchecks   Generalized weakness: PT/OT consulted  DVT prophylaxis: eliquis Code Status: DNR Family Communication:  Disposition Plan: depends on PT/OT recs   Consultants:   Cardio    Procedures:    Antimicrobials:    Subjective: Pt c/o fatigue   Objective: Vitals:   10/03/19 1156 10/03/19 1607 10/03/19 2029 10/04/19 0618  BP: 134/63 (!) 143/49 (!) 152/58 (!) 126/54  Pulse: 66 73 (!) 57 (!) 55  Resp: 18 17    Temp: 98.2 F (36.8 C) 98.1 F (36.7 C) 98.3 F (36.8 C) 97.9 F (36.6 C)  TempSrc: Oral Oral Oral Oral  SpO2: 98% 92% 91% 90%  Weight: 94.7 kg   94.9 kg  Height: 5\' 1"  (1.549 m)       Intake/Output Summary (Last 24 hours) at 10/04/2019 0813 Last data filed at 10/04/2019 7782 Gross per 24 hour  Intake 0 ml  Output 1150 ml  Net -1150 ml   Filed Weights   10/02/19 1648 10/03/19 1156 10/04/19 0618  Weight: 97.5 kg 94.7 kg 94.9 kg    Examination:  General exam: Appears calm and comfortable  Respiratory system: decreased breath sounds b/l  Cardiovascular system: irregularly irregular. No rubs, gallops or clicks.  Gastrointestinal system: Abdomen is  nondistended, soft and nontender.  Hypoactive bowel sounds heard. Central nervous system: Alert and oriented. Moves all 4 extremities Psychiatry: Judgement and insight appear normal. Flat mood and affect     Data Reviewed: I have personally reviewed following labs and imaging studies  CBC: Recent Labs  Lab 10/02/19 1652 10/03/19 0449 10/04/19 0632  WBC 5.8 6.7 6.8  NEUTROABS  --  4.8  --   HGB 10.6* 10.0* 10.3*  HCT 33.9* 31.8* 32.4*  MCV 89.7 89.6 88.0  PLT 215 199 423   Basic Metabolic Panel: Recent Labs  Lab 10/02/19 1652 10/03/19 0449 10/04/19 0632  NA 140 142 143  K 3.9 3.8 3.5  CL 108 107 108  CO2 23 27 26   GLUCOSE 143* 127* 109*  BUN 41* 38* 34*  CREATININE 2.32* 2.08* 1.61*  CALCIUM 8.8* 8.7* 9.0   GFR: Estimated Creatinine Clearance: 23.9 mL/min (A) (by C-G formula based on SCr of 1.61 mg/dL (H)). Liver Function Tests: No results for input(s): AST, ALT, ALKPHOS, BILITOT, PROT, ALBUMIN in the last 168 hours. No results for input(s): LIPASE, AMYLASE in the last 168 hours. No results for input(s): AMMONIA in the last 168 hours. Coagulation Profile: No results for input(s): INR, PROTIME in the last 168 hours. Cardiac Enzymes: No results for input(s): CKTOTAL, CKMB, CKMBINDEX, TROPONINI in the last 168 hours. BNP (last 3 results) No results for input(s): PROBNP in the last 8760  hours. HbA1C: Recent Labs    10/03/19 0449  HGBA1C 6.3*   CBG: Recent Labs  Lab 10/03/19 0822 10/03/19 1148 10/03/19 1621 10/03/19 2035 10/04/19 0748  GLUCAP 122* 147* 95 119* 106*   Lipid Profile: No results for input(s): CHOL, HDL, LDLCALC, TRIG, CHOLHDL, LDLDIRECT in the last 72 hours. Thyroid Function Tests: Recent Labs    10/02/19 2027  TSH 7.885*   Anemia Panel: No results for input(s): VITAMINB12, FOLATE, FERRITIN, TIBC, IRON, RETICCTPCT in the last 72 hours. Sepsis Labs: No results for input(s): PROCALCITON, LATICACIDVEN in the last 168 hours.  Recent  Results (from the past 240 hour(s))  SARS Coronavirus 2 by RT PCR (hospital order, performed in [facility] hospital lab) Nasopharyngeal Nasopharyngeal Swab     Status: None   Collection Time: 10/02/19  8:05 PM   Specimen: Nasopharyngeal Swab  Result Value Ref Range Status   SARS Coronavirus 2 NEGATIVE NEGATIVE Final    Comment: (NOTE) SARS-CoV-2 target nucleic acids are NOT DETECTED. The SARS-CoV-2 RNA is generally detectable in upper and lower respiratory specimens during the acute phase of infection. The lowest concentration of SARS-CoV-2 viral copies this assay can detect is 250 copies / mL. A negative result does not preclude SARS-CoV-2 infection and should not be used as the sole basis for treatment or other patient management decisions.  A negative result may occur with improper specimen collection / handling, submission of specimen other than nasopharyngeal swab, presence of viral mutation(s) within the areas targeted by this assay, and inadequate number of viral copies (<250 copies / mL). A negative result must be combined with clinical observations, patient history, and epidemiological information. Fact Sheet for Patients:   StrictlyIdeas.no Fact Sheet for Healthcare Providers: BankingDealers.co.za This test is not yet approved or cleared  by the [place] FDA and has been authorized for detection and/or diagnosis of SARS-CoV-2 by FDA under an Emergency Use Authorization (EUA).  This EUA will remain in effect (meaning this test can be used) for the duration of the COVID-19 declaration under Section 564(b)(1) of the Act, 21 U.S.C. section 360bbb-3(b)(1), unless the authorization is terminated or revoked sooner. Performed at [facility], [address] [place], [place] [address]   MRSA PCR Screening     Status: None   Collection Time: 10/03/19 12:18 PM   Specimen: Nasal Mucosa; Nasopharyngeal  Result Value Ref  Range Status   MRSA by PCR NEGATIVE NEGATIVE Final    Comment:        The GeneXpert MRSA Assay (FDA approved for NASAL specimens only), is one component of a comprehensive MRSA colonization surveillance program. It is not intended to diagnose MRSA infection nor to guide or monitor treatment for MRSA infections. Performed at [facility], [address] [place],  [address]          Radiology Studies: DG Chest 2 View  Result Date: 10/02/2019 CLINICAL DATA:  86 year old female with shortness of breath. EXAM: CHEST - 2 VIEW COMPARISON:  Chest radiograph dated 04/26/2018. FINDINGS: There is diffuse interstitial coarsening and bronchitic changes. Left mid to lower lung field interstitial and streaky densities may represent combination of atelectasis and infiltrate. Clinical correlation is recommended. No large pleural effusion or pneumothorax. Stable cardiomegaly. Atherosclerotic calcification of the aorta. No acute osseous pathology. Degenerative changes of the spine. IMPRESSION: Left mid to lower lung field atelectasis densities concerning for pneumonia. Clinical correlation is recommended. Electronically Signed   By: Anner Crete M.D.   On: 10/02/2019 17:46  CT Chest Wo Contrast  Result Date: 10/02/2019 CLINICAL DATA:  Shortness of breath for several weeks, bilateral lower extremity edema, wheezing EXAM: CT CHEST WITHOUT CONTRAST TECHNIQUE: Multidetector CT imaging of the chest was performed following the standard protocol without IV contrast. COMPARISON:  10/02/2019, 04/27/2018 FINDINGS: Cardiovascular: Borderline cardiomegaly unchanged. No pericardial effusion. There is extensive atherosclerosis of the thoracic aorta, stable. Minimal atherosclerosis of the LAD distribution of the coronary vasculature. Mediastinum/Nodes: Stable borderline enlarged mediastinal lymph nodes, largest in the precarinal region measuring 13 mm in short axis reference image 35/4. Thyroid,  trachea, and esophagus are stable. Lungs/Pleura: There is diffuse interlobular septal thickening. Scattered mosaic attenuation of the lungs consistent with areas of air trapping. Scattered areas of scarring are seen primarily at the lung bases. No focal airspace disease. Trace bilateral pleural effusions. No pneumothorax. The central airways are patent. Upper Abdomen: No acute abnormality. Musculoskeletal: No acute or destructive bony lesions. Reconstructed images demonstrate no additional findings. IMPRESSION: 1. Bibasilar scarring, with scattered areas of air trapping consistent with reactive airway disease. 2. Cardiomegaly with mild interlobular septal thickening and trace bilateral effusions could reflect an element of mild interstitial edema. 3. No acute airspace disease. 4.  Aortic Atherosclerosis (ICD10-I70.0). 5. Stable borderline enlarged mediastinal lymph nodes, likely reactive. Electronically Signed   By: Randa Ngo M.D.   On: 10/02/2019 18:37        Scheduled Meds: . allopurinol  100 mg Oral Daily  . amiodarone  200 mg Oral Daily  . apixaban  2.5 mg Oral BID  . aspirin EC  81 mg Oral Daily  . cholecalciferol  1,000 Units Oral Daily  . docusate sodium  100 mg Oral Daily  . feeding supplement (ENSURE ENLIVE)  237 mL Oral BID BM  . furosemide  40 mg Oral Daily  . hydrALAZINE  10 mg Oral TID  . insulin aspart  0-15 Units Subcutaneous TID PC & HS  . levothyroxine  75 mcg Oral Daily  . metoprolol succinate  100 mg Oral Daily  . multivitamin with minerals  1 tablet Oral Daily  . pravastatin  40 mg Oral QODAY  . sodium chloride flush  3 mL Intravenous Q12H  . cyanocobalamin  1,000 mcg Oral Daily   Continuous Infusions: . sodium chloride       LOS: 2 days    Time spent: 30 mins     Wyvonnia Dusky, MD [facility] Pager [phone]  If 7PM-7AM, please contact night-coverage [email or web address] 10/04/2019, 8:13 AM

## 2019-10-04 NOTE — Plan of Care (Signed)
  Problem: Education: Goal: Knowledge of General Education information will improve Description: Including pain rating scale, medication(s)/side effects and non-pharmacologic comfort measures Outcome: Progressing   Problem: Health Behavior/Discharge Planning: Goal: Ability to manage health-related needs will improve Outcome: Not Progressing Note: Per cardiology, patient needs continuing diuresis. Will continue to monitor fluid and respiratory status for the remainder of the shift. Patient from a facility. Wenda Low [facility]

## 2019-10-05 DIAGNOSIS — N179 Acute kidney failure, unspecified: Secondary | ICD-10-CM

## 2019-10-05 LAB — CBC
HCT: 33.3 % — ABNORMAL LOW (ref 36.0–46.0)
Hemoglobin: 10.4 g/dL — ABNORMAL LOW (ref 12.0–15.0)
MCH: 28.2 pg (ref 26.0–34.0)
MCHC: 31.2 g/dL (ref 30.0–36.0)
MCV: 90.2 fL (ref 80.0–100.0)
Platelets: 199 10*3/uL (ref 150–400)
RBC: 3.69 MIL/uL — ABNORMAL LOW (ref 3.87–5.11)
RDW: 16.1 % — ABNORMAL HIGH (ref 11.5–15.5)
WBC: 7.9 10*3/uL (ref 4.0–10.5)
nRBC: 0 % (ref 0.0–0.2)

## 2019-10-05 LAB — BASIC METABOLIC PANEL
Anion gap: 11 (ref 5–15)
BUN: 35 mg/dL — ABNORMAL HIGH (ref 8–23)
CO2: 26 mmol/L (ref 22–32)
Calcium: 9.2 mg/dL (ref 8.9–10.3)
Chloride: 107 mmol/L (ref 98–111)
Creatinine, Ser: 1.59 mg/dL — ABNORMAL HIGH (ref 0.44–1.00)
GFR calc Af Amer: 33 mL/min — ABNORMAL LOW (ref >60)
GFR calc non Af Amer: 28 mL/min — ABNORMAL LOW (ref >60)
Glucose, Bld: 102 mg/dL — ABNORMAL HIGH (ref 70–99)
Potassium: 3.7 mmol/L (ref 3.5–5.1)
Sodium: 144 mmol/L (ref 135–145)

## 2019-10-05 LAB — GLUCOSE, CAPILLARY
Glucose-Capillary: 102 mg/dL — ABNORMAL HIGH (ref 70–99)
Glucose-Capillary: 98 mg/dL (ref 70–99)
Glucose-Capillary: 131 mg/dL — ABNORMAL HIGH (ref 70–99)
Glucose-Capillary: 107 mg/dL — ABNORMAL HIGH (ref 70–99)

## 2019-10-05 LAB — TROPONIN I (HIGH SENSITIVITY): Troponin I (High Sensitivity): 13 ng/L (ref <18)

## 2019-10-05 LAB — BRAIN NATRIURETIC PEPTIDE: B Natriuretic Peptide: 601 pg/mL — ABNORMAL HIGH (ref 0.0–100.0)

## 2019-10-05 NOTE — Plan of Care (Signed)
°  Problem: Education: Goal: Knowledge of General Education information will improve Description: Including pain rating scale, medication(s)/side effects and non-pharmacologic comfort measures Outcome: Progressing   Problem: Health Behavior/Discharge Planning: Goal: Ability to manage health-related needs will improve Outcome: Progressing   Problem: Clinical Measurements: Goal: Ability to maintain clinical measurements within normal limits will improve Outcome: Not Progressing Note: BUN and creatinine are elevated, and patient is anemic. Does have a history of C.K.D. Will continue to monitor lab values for the remainder of the shift. Wenda Low [facility]

## 2019-10-05 NOTE — Progress Notes (Signed)
Patient Name: Destiny Oliver Date of Encounter: 10/05/2019  Hospital Problem List     Active Problems:   Acute on chronic diastolic CHF (congestive heart failure) [employer])    Patient Profile     86 y.o.femalewith history ofdiastolic CHF, hypertension, dyslipidemia, type 2 diabetes mellitus, hypothyroidismand stage IV chronic kidney disease, who presented to the emergency room with acute onset of worsening dyspnea over the last coupleofweeks as well as worsening lower extremity edema. She admits to orthopnea and paroxysmal nocturnal dyspnea as well as dyspnea on exertion.She was satting at 91% on room air on presentation. Her BNP was 579. She had acute on chronic renal insufficiency and her high-sensitivity troponin initially was 9 with subsequent value of 10. She has ruled out for myocardial infarction. Chest x-ray revealed trace bilateral effusions with interlobular septal thickening. Chest CT revealed no acute airspace disease. EKG showed sinus bradycardia with no ischemia. She was given IV Lasix with some improvement. She is a difficult historian. She currently denies chest pain or shortness of breath. Echocardiogram done 2 days ago in the office revealed an EF of 55% with severe TR pulmonary   Subjective   Still somewhat difficult historian.  Mild shortness of breath.  Inpatient Medications    . allopurinol  100 mg Oral Daily  . amiodarone  200 mg Oral Daily  . apixaban  2.5 mg Oral BID  . cholecalciferol  1,000 Units Oral Daily  . docusate sodium  100 mg Oral Daily  . feeding supplement (ENSURE ENLIVE)  237 mL Oral BID BM  . furosemide  40 mg Intravenous Daily  . hydrALAZINE  10 mg Oral TID  . insulin aspart  0-15 Units Subcutaneous TID PC & HS  . levothyroxine  75 mcg Oral Daily  . metoprolol succinate  50 mg Oral Daily  . multivitamin with minerals  1 tablet Oral Daily  . pravastatin  40 mg Oral QODAY  . sodium chloride flush  3 mL Intravenous Q12H  .  cyanocobalamin  1,000 mcg Oral Daily    Vital Signs    Vitals:   10/04/19 1934 10/05/19 0525 10/05/19 0733 10/05/19 1156  BP: (!) 146/65 (!) 127/54 (!) 139/59 (!) 110/51  Pulse: 66 (!) 55 60 61  Resp: (!) 22 20 17 16   Temp: 98.8 F (37.1 C) 98.7 F (37.1 C) 97.9 F (36.6 C) 98.3 F (36.8 C)  TempSrc: Oral Oral Oral Oral  SpO2: 91% 96% 97% 92%  Weight:  93.2 kg    Height:        Intake/Output Summary (Last 24 hours) at 10/05/2019 1409 Last data filed at 10/05/2019 1119 Gross per 24 hour  Intake 243 ml  Output 1550 ml  Net -1307 ml   Filed Weights   10/03/19 1156 10/04/19 0618 10/05/19 0525  Weight: 94.7 kg 94.9 kg 93.2 kg    Physical Exam    GEN: Well nourished, well developed, in no acute distress.  HEENT: normal.  Neck: Supple, no JVD, carotid bruits, or masses. Cardiac: RRR, no murmurs, rubs, or gallops. No clubbing, cyanosis, edema.  Radials/DP/PT 2+ and equal bilaterally.  Respiratory:  Respirations regular and unlabored, clear to auscultation bilaterally. GI: Soft, nontender, nondistended, BS + x 4. MS: no deformity or atrophy. Skin: warm and dry, no rash. Neuro:  Strength and sensation are intact. Psych: Normal affect.  Labs    CBC Recent Labs    10/03/19 0449 10/03/19 0449 10/04/19 0632 10/05/19 0701  WBC 6.7   < >  6.8 7.9  NEUTROABS 4.8  --   --   --   HGB 10.0*   < > 10.3* 10.4*  HCT 31.8*   < > 32.4* 33.3*  MCV 89.6   < > 88.0 90.2  PLT 199   < > 197 199   < > = values in this interval not displayed.   Basic Metabolic Panel Recent Labs    10/04/19 0632 10/05/19 0701  NA 143 144  K 3.5 3.7  CL 108 107  CO2 26 26  GLUCOSE 109* 102*  BUN 34* 35*  CREATININE 1.61* 1.59*  CALCIUM 9.0 9.2   Liver Function Tests No results for input(s): AST, ALT, ALKPHOS, BILITOT, PROT, ALBUMIN in the last 72 hours. No results for input(s): LIPASE, AMYLASE in the last 72 hours. Cardiac Enzymes No results for input(s): CKTOTAL, CKMB, CKMBINDEX, TROPONINI  in the last 72 hours. BNP Recent Labs    10/02/19 1652 10/03/19 1151 10/05/19 0701  BNP 579.4* 634.9* 601.0*   D-Dimer No results for input(s): DDIMER in the last 72 hours. Hemoglobin A1C Recent Labs    10/03/19 0449  HGBA1C 6.3*   Fasting Lipid Panel No results for input(s): CHOL, HDL, LDLCALC, TRIG, CHOLHDL, LDLDIRECT in the last 72 hours. Thyroid Function Tests Recent Labs    10/02/19 2027  TSH 7.885*    Telemetry    Sinus rhythm with first-degree AV block  ECG    Sinus bradycardia with left anterior fascicular block  Radiology    DG Chest 2 View  Result Date: 10/02/2019 CLINICAL DATA:  86 year old female with shortness of breath. EXAM: CHEST - 2 VIEW COMPARISON:  Chest radiograph dated 04/26/2018. FINDINGS: There is diffuse interstitial coarsening and bronchitic changes. Left mid to lower lung field interstitial and streaky densities may represent combination of atelectasis and infiltrate. Clinical correlation is recommended. No large pleural effusion or pneumothorax. Stable cardiomegaly. Atherosclerotic calcification of the aorta. No acute osseous pathology. Degenerative changes of the spine. IMPRESSION: Left mid to lower lung field atelectasis densities concerning for pneumonia. Clinical correlation is recommended. Electronically Signed   By: Anner Crete M.D.   On: 10/02/2019 17:46   CT Chest Wo Contrast  Result Date: 10/02/2019 CLINICAL DATA:  Shortness of breath for several weeks, bilateral lower extremity edema, wheezing EXAM: CT CHEST WITHOUT CONTRAST TECHNIQUE: Multidetector CT imaging of the chest was performed following the standard protocol without IV contrast. COMPARISON:  10/02/2019, 04/27/2018 FINDINGS: Cardiovascular: Borderline cardiomegaly unchanged. No pericardial effusion. There is extensive atherosclerosis of the thoracic aorta, stable. Minimal atherosclerosis of the LAD distribution of the coronary vasculature. Mediastinum/Nodes: Stable borderline  enlarged mediastinal lymph nodes, largest in the precarinal region measuring 13 mm in short axis reference image 35/4. Thyroid, trachea, and esophagus are stable. Lungs/Pleura: There is diffuse interlobular septal thickening. Scattered mosaic attenuation of the lungs consistent with areas of air trapping. Scattered areas of scarring are seen primarily at the lung bases. No focal airspace disease. Trace bilateral pleural effusions. No pneumothorax. The central airways are patent. Upper Abdomen: No acute abnormality. Musculoskeletal: No acute or destructive bony lesions. Reconstructed images demonstrate no additional findings. IMPRESSION: 1. Bibasilar scarring, with scattered areas of air trapping consistent with reactive airway disease. 2. Cardiomegaly with mild interlobular septal thickening and trace bilateral effusions could reflect an element of mild interstitial edema. 3. No acute airspace disease. 4.  Aortic Atherosclerosis (ICD10-I70.0). 5. Stable borderline enlarged mediastinal lymph nodes, likely reactive. Electronically Signed   By: Diana Eves.D.  On: 10/02/2019 18:37    Assessment & Plan    86 year old female with history of diastolic heart failure with preserved LV function, history of pulmonary hypertension, diabetes, stage IV chronic kidney disease who presented with worsening dyspnea and lower extremity edema. She had an echo 2 days ago showing significant pulmonary hypertension with TR. There is no significant left heart failure noted on chest x-ray or CT of the chest. Echocardiogram done in 2019 revealed preserved LV function with with less severe TR.  1. Atrial fibrillation-appears to be in sinus rhythm/sinus bradycardia at present. Is currently anticoagulated with Eliquis and has been so since 2019 at 2.5 mg twice daily given her renal function. Her rhythm is controlled with amiodarone at 200 mg daily and metoprolol succinate 100 mg daily. Would continue with apixaban and  amiodarone. Bradycardia does not appear to be symptomatic Will reduce metoprolol to 50 mg succ daily and follow pressure and rate. Will d/c asa since she is on eliquis.   Heart rate is improved somewhat since reducing the metoprolol.  We will continue with 50 mg for now.  2. Pulmonary hypertension-not previously noted. Patient is anticoagulated which makes pulmonary embolus less likely. Given renal function, patient not an ideal candidate for CT angiogram. Given the fact she is already anticoagulated will defer VQ scan at present. This may need to be considered future. Will likely need consideration for outpatient follow-up with pulmonary however given advanced age, treatment options are limited for this. Nasal cannula oxygen appears appropriate.  3. Hyperlipidemia-continue with pravastatin at 40 mg daily with an LDL goal of around 100.  4. HFpEF-preserved LV function by echo 2 days ago. No obvious pulmonary edema on chest x-ray. Will be careful with diuresis following renal function, hemodynamics and electrolytes. BNP slightly higher than admission. Will need to continue diuresis and follow hemodynamics, electrolytes, symptoms and BNP. Will IV lasix  diuresis.  Will check BNP in the morning.  BNP is improved from 2 days ago to 601.  Creatinine slightly down at 1.59.  If BNP improved and renal function improved will convert to p.o. furosemide.    Signed, Javier Docker Marce Charlesworth MD 10/05/2019, 2:09 PM  Pager: [phone]

## 2019-10-05 NOTE — Progress Notes (Signed)
PROGRESS NOTE    Destiny Oliver  TIW:[id number] DOB: 02/06/1927 DOA: 10/02/2019 PCP: Juluis Pitch, MD    Assessment & Plan:   Active Problems:   Acute on chronic diastolic CHF (congestive heart failure) (HCC)   Acute on chronic diastolic CHF: continue on IV lasix. Monitor I/Os and daily weights. Continue on tele. Cardio following and recs apprec  Pulmonary HTN: no acute intervention necessary as per cardio   AKI vs CKD: baseline Cr is unknown. Cr is trending down today again. Will continue to monitor   HTN: continue on metoprolol, hydralazine   PAF: continue on amiodarone, eliquis & a reduced dose of metoprolol as per cardio  Hypothyroidism: continue on levothyroxine   Dyslipidemia: continue on statin   Gout:  continue allopurinol and colchicine.  DM2: will continue on SSI w/ accuchecks   Generalized weakness: PT/OT recs home health. Home health orders placed  DVT prophylaxis: eliquis Code Status: DNR Family Communication:  Disposition Plan: likely d/c home w/ home health. Unlikely any barriers  Status is: Inpatient  Remains inpatient appropriate because:IV treatments appropriate due to intensity of illness or inability to take PO   Dispo: The patient is from: Home              Anticipated d/c is to: Home w/ home health              Anticipated d/c date is: 2 days              Patient currently is not medically stable to d/c.   Consultants:   Cardio    Procedures:    Antimicrobials:    Subjective: Pt c/o shortness of breath.   Objective: Vitals:   10/04/19 1832 10/04/19 1934 10/05/19 0525 10/05/19 0733  BP: (!) 143/75 (!) 146/65 (!) 127/54 (!) 139/59  Pulse: 60 66 (!) 55 60  Resp: 16 (!) 22 20 17   Temp: 98.7 F (37.1 C) 98.8 F (37.1 C) 98.7 F (37.1 C) 97.9 F (36.6 C)  TempSrc: Oral Oral Oral Oral  SpO2: 93% 91% 96% 97%  Weight:   93.2 kg   Height:        Intake/Output Summary (Last 24 hours) at 10/05/2019 0753 Last data filed at  10/05/2019 0500 Gross per 24 hour  Intake 363 ml  Output 850 ml  Net -487 ml   Filed Weights   10/03/19 1156 10/04/19 0618 10/05/19 0525  Weight: 94.7 kg 94.9 kg 93.2 kg    Examination:  General exam: Appears calm and comfortable  Respiratory system: decreased breath sounds b/l  Cardiovascular system: irregularly irregular. No rubs, gallops or clicks.  Gastrointestinal system: Abdomen is nondistended, soft and nontender.  Hypoactive bowel sounds heard. Central nervous system: Alert and oriented. Moves all 4 extremities Psychiatry: Judgement and insight appear normal. Flat mood and affect     Data Reviewed: I have personally reviewed following labs and imaging studies  CBC: Recent Labs  Lab 10/02/19 1652 10/03/19 0449 10/04/19 0632  WBC 5.8 6.7 6.8  NEUTROABS  --  4.8  --   HGB 10.6* 10.0* 10.3*  HCT 33.9* 31.8* 32.4*  MCV 89.7 89.6 88.0  PLT 215 199 250   Basic Metabolic Panel: Recent Labs  Lab 10/02/19 1652 10/03/19 0449 10/04/19 0632  NA 140 142 143  K 3.9 3.8 3.5  CL 108 107 108  CO2 23 27 26   GLUCOSE 143* 127* 109*  BUN 41* 38* 34*  CREATININE 2.32* 2.08* 1.61*  CALCIUM 8.8* 8.7*  9.0   GFR: Estimated Creatinine Clearance: 23.7 mL/min (A) (by C-G formula based on SCr of 1.61 mg/dL (H)). Liver Function Tests: No results for input(s): AST, ALT, ALKPHOS, BILITOT, PROT, ALBUMIN in the last 168 hours. No results for input(s): LIPASE, AMYLASE in the last 168 hours. No results for input(s): AMMONIA in the last 168 hours. Coagulation Profile: No results for input(s): INR, PROTIME in the last 168 hours. Cardiac Enzymes: No results for input(s): CKTOTAL, CKMB, CKMBINDEX, TROPONINI in the last 168 hours. BNP (last 3 results) No results for input(s): PROBNP in the last 8760 hours. HbA1C: Recent Labs    10/03/19 0449  HGBA1C 6.3*   CBG: Recent Labs  Lab 10/04/19 0748 10/04/19 1142 10/04/19 1707 10/04/19 2050 10/05/19 0733  GLUCAP 106* 87 118* 109*  102*   Lipid Profile: No results for input(s): CHOL, HDL, LDLCALC, TRIG, CHOLHDL, LDLDIRECT in the last 72 hours. Thyroid Function Tests: Recent Labs    10/02/19 2027  TSH 7.885*   Anemia Panel: No results for input(s): VITAMINB12, FOLATE, FERRITIN, TIBC, IRON, RETICCTPCT in the last 72 hours. Sepsis Labs: No results for input(s): PROCALCITON, LATICACIDVEN in the last 168 hours.  Recent Results (from the past 240 hour(s))  SARS Coronavirus 2 by RT PCR (hospital order, performed in [facility] hospital lab) Nasopharyngeal Nasopharyngeal Swab     Status: None   Collection Time: 10/02/19  8:05 PM   Specimen: Nasopharyngeal Swab  Result Value Ref Range Status   SARS Coronavirus 2 NEGATIVE NEGATIVE Final    Comment: (NOTE) SARS-CoV-2 target nucleic acids are NOT DETECTED. The SARS-CoV-2 RNA is generally detectable in upper and lower respiratory specimens during the acute phase of infection. The lowest concentration of SARS-CoV-2 viral copies this assay can detect is 250 copies / mL. A negative result does not preclude SARS-CoV-2 infection and should not be used as the sole basis for treatment or other patient management decisions.  A negative result may occur with improper specimen collection / handling, submission of specimen other than nasopharyngeal swab, presence of viral mutation(s) within the areas targeted by this assay, and inadequate number of viral copies (<250 copies / mL). A negative result must be combined with clinical observations, patient history, and epidemiological information. Fact Sheet for Patients:   StrictlyIdeas.no Fact Sheet for Healthcare Providers: BankingDealers.co.za This test is not yet approved or cleared  by the [place] FDA and has been authorized for detection and/or diagnosis of SARS-CoV-2 by FDA under an Emergency Use Authorization (EUA).  This EUA will remain in effect (meaning this test can be  used) for the duration of the COVID-19 declaration under Section 564(b)(1) of the Act, 21 U.S.C. section 360bbb-3(b)(1), unless the authorization is terminated or revoked sooner. Performed at [facility], [address] [place], [place] [address]   MRSA PCR Screening     Status: None   Collection Time: 10/03/19 12:18 PM   Specimen: Nasal Mucosa; Nasopharyngeal  Result Value Ref Range Status   MRSA by PCR NEGATIVE NEGATIVE Final    Comment:        The GeneXpert MRSA Assay (FDA approved for NASAL specimens only), is one component of a comprehensive MRSA colonization surveillance program. It is not intended to diagnose MRSA infection nor to guide or monitor treatment for MRSA infections. Performed at [facility], [address], [place],  [address]          Radiology Studies: No results found.      Scheduled Meds: .  allopurinol  100 mg Oral Daily  . amiodarone  200 mg Oral Daily  . apixaban  2.5 mg Oral BID  . cholecalciferol  1,000 Units Oral Daily  . docusate sodium  100 mg Oral Daily  . feeding supplement (ENSURE ENLIVE)  237 mL Oral BID BM  . furosemide  40 mg Intravenous Daily  . hydrALAZINE  10 mg Oral TID  . insulin aspart  0-15 Units Subcutaneous TID PC & HS  . levothyroxine  75 mcg Oral Daily  . metoprolol succinate  50 mg Oral Daily  . multivitamin with minerals  1 tablet Oral Daily  . pravastatin  40 mg Oral QODAY  . sodium chloride flush  3 mL Intravenous Q12H  . cyanocobalamin  1,000 mcg Oral Daily   Continuous Infusions: . sodium chloride       LOS: 3 days    Time spent: 32 mins     Wyvonnia Dusky, MD [facility] Pager [phone]  If 7PM-7AM, please contact night-coverage [email or web address] 10/05/2019, 7:53 AM

## 2019-10-06 LAB — BASIC METABOLIC PANEL
Anion gap: 7 (ref 5–15)
BUN: 41 mg/dL — ABNORMAL HIGH (ref 8–23)
CO2: 28 mmol/L (ref 22–32)
Calcium: 8.9 mg/dL (ref 8.9–10.3)
Chloride: 108 mmol/L (ref 98–111)
Creatinine, Ser: 1.52 mg/dL — ABNORMAL HIGH (ref 0.44–1.00)
GFR calc Af Amer: 34 mL/min — ABNORMAL LOW (ref >60)
GFR calc non Af Amer: 30 mL/min — ABNORMAL LOW (ref >60)
Glucose, Bld: 108 mg/dL — ABNORMAL HIGH (ref 70–99)
Potassium: 3.7 mmol/L (ref 3.5–5.1)
Sodium: 143 mmol/L (ref 135–145)

## 2019-10-06 LAB — CBC
HCT: 31.9 % — ABNORMAL LOW (ref 36.0–46.0)
Hemoglobin: 10.1 g/dL — ABNORMAL LOW (ref 12.0–15.0)
MCH: 27.8 pg (ref 26.0–34.0)
MCHC: 31.7 g/dL (ref 30.0–36.0)
MCV: 87.9 fL (ref 80.0–100.0)
Platelets: 187 10*3/uL (ref 150–400)
RBC: 3.63 MIL/uL — ABNORMAL LOW (ref 3.87–5.11)
RDW: 16.2 % — ABNORMAL HIGH (ref 11.5–15.5)
WBC: 7.3 10*3/uL (ref 4.0–10.5)
nRBC: 0 % (ref 0.0–0.2)

## 2019-10-06 LAB — BRAIN NATRIURETIC PEPTIDE: B Natriuretic Peptide: 391.1 pg/mL — ABNORMAL HIGH (ref 0.0–100.0)

## 2019-10-06 LAB — GLUCOSE, CAPILLARY
Glucose-Capillary: 94 mg/dL (ref 70–99)
Glucose-Capillary: 145 mg/dL — ABNORMAL HIGH (ref 70–99)

## 2019-10-06 LAB — MAGNESIUM: Magnesium: 2.3 mg/dL (ref 1.7–2.4)

## 2019-10-06 LAB — TROPONIN I (HIGH SENSITIVITY): Troponin I (High Sensitivity): 12 ng/L (ref <18)

## 2019-10-06 MED ORDER — FUROSEMIDE 40 MG PO TABS: 40.0000 mg | ORAL_TABLET | Freq: Every day | ORAL | Status: DC

## 2019-10-06 MED ORDER — METOPROLOL SUCCINATE ER 50 MG PO TB24: 50.0000 mg | ORAL_TABLET | Freq: Every day | ORAL | 0 refills | Status: DC

## 2019-10-06 MED ORDER — FUROSEMIDE 40 MG PO TABS: 40.0000 mg | ORAL_TABLET | Freq: Every day | ORAL | 0 refills | Status: DC

## 2019-10-06 NOTE — Progress Notes (Signed)
Patient Name: Destiny Oliver Date of Encounter: 10/06/2019  Hospital Problem List     Active Problems:   Acute on chronic diastolic CHF (congestive heart failure) [facility])    Patient Profile       86 y.o.femalewith history ofdiastolic CHF, hypertension, dyslipidemia, type 2 diabetes mellitus, hypothyroidismand stage IV chronic kidney disease, who presented to the emergency room with acute onset of worsening dyspnea over the last coupleofweeks as well as worsening lower extremity edema. She admits to orthopnea and paroxysmal nocturnal dyspnea as well as dyspnea on exertion.She was satting at 91% on room air on presentation. Her BNP was 579. She had acute on chronic renal insufficiency and her high-sensitivity troponin initially was 9 with subsequent value of 10. She has ruled out for myocardial infarction. Chest x-ray revealed trace bilateral effusions with interlobular septal thickening. Chest CT revealed no acute airspace disease. EKG showed sinus bradycardia with no ischemia. She was given IV Lasix with some improvement. She is a difficult historian. She currently denies chest pain or shortness of breath. Echocardiogram done 2 days ago in the office revealed an EF of 55% with severe TR pulmonary   Subjective   Clinically improved and looks good this afternoon.   Inpatient Medications    . allopurinol  100 mg Oral Daily  . amiodarone  200 mg Oral Daily  . apixaban  2.5 mg Oral BID  . cholecalciferol  1,000 Units Oral Daily  . docusate sodium  100 mg Oral Daily  . feeding supplement (ENSURE ENLIVE)  237 mL Oral BID BM  . furosemide  40 mg Intravenous Daily  . hydrALAZINE  10 mg Oral TID  . levothyroxine  75 mcg Oral Daily  . metoprolol succinate  50 mg Oral Daily  . multivitamin with minerals  1 tablet Oral Daily  . pravastatin  40 mg Oral QODAY  . sodium chloride flush  3 mL Intravenous Q12H  . cyanocobalamin  1,000 mcg Oral Daily    Vital Signs    Vitals:    10/06/19 0624 10/06/19 0805 10/06/19 0947 10/06/19 1122  BP: (!) 141/48 (!) 127/54  (!) 110/48  Pulse: 67 67 73 62  Resp: 16 18  19   Temp: 98.2 F (36.8 C) 98.3 F (36.8 C)  98.6 F (37 C)  TempSrc: Oral Oral  Oral  SpO2: 95% 92%  93%  Weight: 93.3 kg     Height:        Intake/Output Summary (Last 24 hours) at 10/06/2019 1439 Last data filed at 10/06/2019 1355 Gross per 24 hour  Intake 483 ml  Output 1700 ml  Net -1217 ml   Filed Weights   10/04/19 0618 10/05/19 0525 10/06/19 0624  Weight: 94.9 kg 93.2 kg 93.3 kg    Physical Exam    GEN: Well nourished, well developed, in no acute distress.  HEENT: normal.  Neck: Supple, no JVD, carotid bruits, or masses. Cardiac: RRR, no murmurs, rubs, or gallops. No clubbing, cyanosis, edema.  Radials/DP/PT 2+ and equal bilaterally.  Respiratory:  Respirations regular and unlabored, clear to auscultation bilaterally. GI: Soft, nontender, nondistended, BS + x 4. MS: no deformity or atrophy. Skin: warm and dry, no rash. Neuro:  Strength and sensation are intact. Psych: Normal affect.  Labs    CBC Recent Labs    10/05/19 0701 10/06/19 0411  WBC 7.9 7.3  HGB 10.4* 10.1*  HCT 33.3* 31.9*  MCV 90.2 87.9  PLT 199 623   Basic Metabolic Panel Recent Labs  10/05/19 0701 10/06/19 0411  NA 144 143  K 3.7 3.7  CL 107 108  CO2 26 28  GLUCOSE 102* 108*  BUN 35* 41*  CREATININE 1.59* 1.52*  CALCIUM 9.2 8.9  MG  --  2.3   Liver Function Tests No results for input(s): AST, ALT, ALKPHOS, BILITOT, PROT, ALBUMIN in the last 72 hours. No results for input(s): LIPASE, AMYLASE in the last 72 hours. Cardiac Enzymes No results for input(s): CKTOTAL, CKMB, CKMBINDEX, TROPONINI in the last 72 hours. BNP Recent Labs    10/05/19 0701 10/06/19 0411  BNP 601.0* 391.1*   D-Dimer No results for input(s): DDIMER in the last 72 hours. Hemoglobin A1C No results for input(s): HGBA1C in the last 72 hours. Fasting Lipid Panel No results  for input(s): CHOL, HDL, LDLCALC, TRIG, CHOLHDL, LDLDIRECT in the last 72 hours. Thyroid Function Tests No results for input(s): TSH, T4TOTAL, T3FREE, THYROIDAB in the last 72 hours.  Invalid input(s): FREET3  Telemetry    nsr with no arrhythmia  ECG    Sinus bradycardia  Radiology    DG Chest 2 View  Result Date: 10/02/2019 CLINICAL DATA:  86 year old female with shortness of breath. EXAM: CHEST - 2 VIEW COMPARISON:  Chest radiograph dated 04/26/2018. FINDINGS: There is diffuse interstitial coarsening and bronchitic changes. Left mid to lower lung field interstitial and streaky densities may represent combination of atelectasis and infiltrate. Clinical correlation is recommended. No large pleural effusion or pneumothorax. Stable cardiomegaly. Atherosclerotic calcification of the aorta. No acute osseous pathology. Degenerative changes of the spine. IMPRESSION: Left mid to lower lung field atelectasis densities concerning for pneumonia. Clinical correlation is recommended. Electronically Signed   By: Anner Crete M.D.   On: 10/02/2019 17:46   CT Chest Wo Contrast  Result Date: 10/02/2019 CLINICAL DATA:  Shortness of breath for several weeks, bilateral lower extremity edema, wheezing EXAM: CT CHEST WITHOUT CONTRAST TECHNIQUE: Multidetector CT imaging of the chest was performed following the standard protocol without IV contrast. COMPARISON:  10/02/2019, 04/27/2018 FINDINGS: Cardiovascular: Borderline cardiomegaly unchanged. No pericardial effusion. There is extensive atherosclerosis of the thoracic aorta, stable. Minimal atherosclerosis of the LAD distribution of the coronary vasculature. Mediastinum/Nodes: Stable borderline enlarged mediastinal lymph nodes, largest in the precarinal region measuring 13 mm in short axis reference image 35/4. Thyroid, trachea, and esophagus are stable. Lungs/Pleura: There is diffuse interlobular septal thickening. Scattered mosaic attenuation of the lungs  consistent with areas of air trapping. Scattered areas of scarring are seen primarily at the lung bases. No focal airspace disease. Trace bilateral pleural effusions. No pneumothorax. The central airways are patent. Upper Abdomen: No acute abnormality. Musculoskeletal: No acute or destructive bony lesions. Reconstructed images demonstrate no additional findings. IMPRESSION: 1. Bibasilar scarring, with scattered areas of air trapping consistent with reactive airway disease. 2. Cardiomegaly with mild interlobular septal thickening and trace bilateral effusions could reflect an element of mild interstitial edema. 3. No acute airspace disease. 4.  Aortic Atherosclerosis (ICD10-I70.0). 5. Stable borderline enlarged mediastinal lymph nodes, likely reactive. Electronically Signed   By: Randa Ngo M.D.   On: 10/02/2019 18:37    Assessment & Plan    86 year old female with history of diastolic heart failure with preserved LV function, history of pulmonary hypertension, diabetes, stage IV chronic kidney disease who presented with worsening dyspnea and lower extremity edema. She had an echo 2 days ago showing significant pulmonary hypertension with TR. There is no significant left heart failure noted on chest x-ray or CT of the chest.  Echocardiogram done in 2019 revealed preserved LV function with with less severe TR.  1. Atrial fibrillation-appears to be in sinus rhythm/sinus bradycardia at present. Is currently anticoagulated with Eliquis and has been so since 2019 at 2.5 mg twice daily given her renal function. Her rhythm is controlled with amiodarone at 200 mg daily and metoprolol succinate 100 mg daily. Would continue with apixaban and amiodarone. Bradycardia does not appear to be symptomaticWill continue with metoprolol to 50 mg succ daily and follow pressure and rate. Will d/c asa since she is on eliquis.  Heart rate is improved somewhat since reducing the metoprolol.  We will continue with 50 mg  for now.  2. Pulmonary hypertension-not previously noted. Patient is anticoagulated which makes pulmonary embolus less likely. Given renal function, patient not an ideal candidate for CT angiogram. Given the fact she is already anticoagulated will defer VQ scan at present. This may need to be considered future. Will likely need consideration for outpatient follow-up with pulmonary however given advanced age, treatment options are limited for this. Nasal cannula oxygen appears appropriate.  3. Hyperlipidemia-continue with pravastatin at 40 mg daily with an LDL goal of around 100.  4. HFpEF-preserved LV function by echo 2 days ago. No obvious pulmonary edema on chest x-ray. Will be careful with diuresis following renal function, hemodynamics and electrolytes.BNP slightly higher than admission. Will need to continue diuresis and follow hemodynamics, electrolytes, symptoms and BNP. Will IV lasix  diuresis. BNP is improved to 391. Clinically patient appears to be at her baseline. OK for discharge on current regimen. .  Creatinine slightly down at 1.52.   OK for discharge from cardiac standpoint on amiodarone 200 mg daily, furosimide 40 mg daily, apixaban 2.5 mg bid, pravastatin 40 mg daily from cardiac standpoint. Made follow up appt in 1 week with Dr. Serafina Royals.    Signed, Javier Docker Ean Gettel MD 10/06/2019, 2:39 PM  Pager: [phone]

## 2019-10-06 NOTE — Progress Notes (Signed)
Marcelyn Bruins to be D/C'd Assisted Living (prior) per MD order. Patient given discharge teaching and paperwork regarding medications, diet, follow-up appointments and activity. Patient understanding verbalized. No questions or complaints at this time. Skin condition as charted. IV and telemetry removed prior to leaving.  No further needs by Care Management/Social Work. Report called to [place] at facility. Hindy Perrault to provide transportation.  An After Visit Summary was printed and given to the patient.   Patient escorted via wheelchair.  Terrilyn Saver

## 2019-10-06 NOTE — TOC Initial Note (Signed)
Transition of Care [facility]) - Initial/Assessment Note    Patient Details  Name: Destiny Oliver MRN: [id number] Date of Birth: 03/21/1927  Transition of Care [facility]) CM/SW Contact:    Shelbie Ammons, RN Phone Number: 10/06/2019, 3:31 PM  Clinical Narrative:    RNCM met with patient at bedside, patient awake and verbally appropriate and reports feeling some better today. Patient lives at [place] and it is her plan to return there when she leaves here. Patient reports that she was to have [facility] start but then she got admitted to the hospital.  Patient reports that she wants to have [facility] when she returns home.  FL-2 completed and faxed to [facility] at [facility].       Expected Discharge Plan: Assisted Living Barriers to Discharge: No Barriers Identified   Patient Goals and CMS Choice        Expected Discharge Plan and Services Expected Discharge Plan: Assisted Living   Discharge Planning Services: CM Consult   Living arrangements for the past 2 months: [place] Expected Discharge Date: 10/06/19                                    Prior Living Arrangements/Services Living arrangements for the past 2 months: [place] Lives with:: Facility Resident Patient language and need for interpreter reviewed:: Yes Do you feel safe going back to the place where you live?: Yes      Need for Family Participation in Patient Care: Yes (Comment) Care giver support system in place?: Yes (comment) Current home services: Home PT Criminal Activity/Legal Involvement Pertinent to Current Situation/Hospitalization: No - Comment as needed  Activities of Daily Living Home Assistive Devices/Equipment: Gilford Rile (specify type) ADL Screening (condition at time of admission) Patient's cognitive ability adequate to safely complete daily activities?: Yes Is the patient deaf or have difficulty hearing?: No Does the patient have difficulty seeing, even when wearing  glasses/contacts?: No Does the patient have difficulty concentrating, remembering, or making decisions?: Yes Patient able to express need for assistance with ADLs?: Yes Does the patient have difficulty dressing or bathing?: No Independently performs ADLs?: Yes (appropriate for developmental age) Does the patient have difficulty walking or climbing stairs?: Yes Weakness of Legs: Both Weakness of Arms/Hands: None  Permission Sought/Granted                  Emotional Assessment Appearance:: Appears stated age Attitude/Demeanor/Rapport: Engaged Affect (typically observed): Appropriate Orientation: : Oriented to Self, Oriented to Place, Oriented to  Time, Oriented to Situation Alcohol / Substance Use: Not Applicable Psych Involvement: No (comment)  Admission diagnosis:  Bronchitis [J40] Acute on chronic diastolic CHF (congestive heart failure) (HCC) [I50.33] Congestive heart failure, unspecified HF chronicity, unspecified heart failure type ([place]) [I50.9] Patient Active Problem List   Diagnosis Date Noted  . Acute on chronic diastolic CHF (congestive heart failure) ([place]) 10/02/2019  . HCAP (healthcare-associated pneumonia) 05/23/2018  . UTI due to extended-spectrum beta lactamase (ESBL) producing Escherichia coli 05/15/2018  . Diplopia 05/08/2018  . Vertigo 05/08/2018  . Type 2 diabetes mellitus with stage 4 chronic kidney disease and hypertension ([place]) 05/02/2018  . Stage 4 chronic renal impairment associated with type 2 diabetes mellitus ([place]) 05/02/2018  . Hypertensive heart and kidney disease with chronic diastolic congestive heart failure and stage 4 chronic kidney disease ([place]) 05/02/2018  . Acute on chronic diastolic heart failure ([place]) 05/02/2018  .  Dyslipidemia associated with type 2 diabetes mellitus ([place]) 05/02/2018  . Anemia associated with stage 4 chronic renal failure ([place]) 05/02/2018  . Acute on chronic respiratory failure with hypoxia ([place]) 05/02/2018  .  Rhabdomyolysis 04/29/2018  . A-fib ([place]) 04/18/2018   PCP:  Juluis Pitch, MD Pharmacy:   [id number] [id number]: [phone]: [phone]    Social Determinants of Health (SDOH) Interventions    Readmission Risk Interventions No flowsheet data found.

## 2019-10-06 NOTE — Care Management Important Message (Signed)
Important Message  Patient Details  Name: Destiny Oliver MRN: [id number] Date of Birth: 05/16/1926   Medicare Important Message Given:  Yes     [name] 10/06/2019, 12:01 PM

## 2019-10-06 NOTE — Discharge Summary (Addendum)
Physician Discharge Summary  Destiny Oliver DJS:[id number] DOB: 24-May-1926 DOA: 10/02/2019  PCP: Juluis Pitch, MD  Admit date: 10/02/2019 Discharge date: 10/06/2019  Admitted From: home Disposition: back to home ALF  Recommendations for Outpatient Follow-up:  1. Follow up with PCP in 1-2 weeks 2. F/U cardio, Dr. Nehemiah Massed, in 1 week  Home Health: yes Equipment/Devices:  Discharge Condition: stable CODE STATUS: DNR Diet recommendation: Heart Healthy   Brief/Interim Summary: HPI was taken from Dr. Sidney Ace: Destiny Oliver  is a 86 y.o. Caucasian female with a known history of diastolic CHF, hypertension, dyslipidemia, type 2 diabetes mellitus, hypothyroidism and stage IV chronic kidney disease, who presented to the emergency room with acute onset of worsening dyspnea over the last couple of weeks as well as worsening lower extremity edema.  She admits to orthopnea and paroxysmal nocturnal dyspnea as well as dyspnea on exertion.  She has been having mild dry cough and wheezing.  She denied any fever or chills.  She was initially taking Lasix twice daily and it was switched to 2 p.o. in a.m.  No nausea vomiting or abdominal pain.  No chest pain or palpitations.    Upon presentation to the emergency room, respiratory rate was 22 and pulse ox 90 was 91% on room air with otherwise normal vital signs.  Labs were remarkable for a BNP of 579.4 and a BUN of 41/2.32 compared to 28/1.88 on 05/14/2018.  High-sensitivity troponin I was 9.  CBC showed anemia at her baseline chest x-ray showed left mid to lower lung field atelectasis and densities suspicious for pneumonia however chest CTA revealed no acute airspace disease.  It showed bibasal scarring with scattered areas of air trapping consistent with reactive airway disease as well as cardiomegaly with mild interlobular septal thickening and trace bilateral pleural effusions that could reflect an element of mild interstitial edema.  It showed aortic  atherosclerosis and stable borderline enlarged mediastinal lymph nodes, likely reactive.  EKG showed sinus bradycardia with rate of 57 with left anterior fascicular block and poor R wave progression.  The patient was given 60 mg of IV Lasix.  She will be admitted to a cardiac progressive unit bed for further evaluation and management.   Hospital Course from Dr. Lenise Herald 6/4-10/06/19: Pt presented w/ shortness of breath secondary to CHF exacerbation. Pt was treated w/ IV lasix and responded relatively well to the treatment. Of note, pt's BP & PAF meds were changed to metoprolol 50mg  daily, lasix 40mg  daily, & d/c diltiazem, torsemide as per cardio. Also, pt was found to have AKI on CKD which improved each day slightly w/ diuresis. Of note, PT/OT saw the pt and recommended home health. Home health was set prior to d/c by CM.    Discharge Diagnoses:  Active Problems:   Acute on chronic diastolic CHF (congestive heart failure) (HCC)  Acute on chronic diastolic CHF: continue on lasix. Monitor I/Os and daily weights. Continue on tele. Cardio following and recs apprec  Pulmonary HTN: no acute intervention necessary as per cardio. Recommend see pulmon as an outpatient    AKI vs CKD: baseline Cr is unknown. Cr continue to trend down. Will continue to monitor   HTN: continue on metoprolol, lasix, amiodarone   PAF: continue on amiodarone, eliquis & a reduced dose of metoprolol as per cardio  Hypothyroidism: continue on levothyroxine   Dyslipidemia: continue on statin   Gout:  continue allopurinol and colchicine.  DM2: will continue on SSI w/ accuchecks while inpatient only  Generalized  weakness: PT/OT recs home health. Home health orders placed  Discharge Instructions  Discharge Instructions    Diet - low sodium heart healthy   Complete by: As directed    Discharge instructions   Complete by: As directed    F/u PCP in 1-2 weeks; F/u cardio in 1 week   Increase activity slowly    Complete by: As directed      Allergies as of 10/06/2019      Reactions   Penicillins Swelling      Medication List    STOP taking these medications   diltiazem 300 MG 24 hr capsule Commonly known as: TIAZAC   hydrALAZINE 10 MG tablet Commonly known as: APRESOLINE   torsemide 20 MG tablet Commonly known as: DEMADEX     TAKE these medications   allopurinol 100 MG tablet Commonly known as: ZYLOPRIM Take 100 mg by mouth daily.   amiodarone 200 MG tablet Commonly known as: PACERONE Take 1 tablet (200 mg total) by mouth daily.   apixaban 2.5 MG Tabs tablet Commonly known as: ELIQUIS Take 1 tablet (2.5 mg total) by mouth 2 (two) times daily.   cholecalciferol 25 MCG (1000 UNIT) tablet Commonly known as: VITAMIN D3 Take 1,000 Units by mouth daily.   colchicine 0.6 MG tablet   [employer]S VITAMIN B12 1000 MCG tablet Generic drug: cyanocobalamin Take 1,000 mcg by mouth daily.   docusate sodium 100 MG capsule Commonly known as: COLACE Take 100 mg by mouth daily.   ENSURE ENLIVE PO Give by mouth two times daily between meals   furosemide 40 MG tablet Commonly known as: LASIX Take 1 tablet (40 mg total) by mouth daily.   Hair Skin and Nails Formula Tabs Take 1 tablet by mouth daily.   Lactobacillus Tabs Take by mouth.   levothyroxine 75 MCG tablet Commonly known as: SYNTHROID Take 75 mcg by mouth daily.   metoprolol succinate 50 MG 24 hr tablet Commonly known as: TOPROL-XL Take 1 tablet (50 mg total) by mouth daily. Take with or immediately following a meal. Start taking on: October 07, 2019 What changed:   medication strength  how much to take  additional instructions   OXYGEN Inhale 2 L/min into the lungs continuous.   polyethylene glycol powder 17 GM/SCOOP powder Commonly known as: GLYCOLAX/MIRALAX Take by mouth.   potassium chloride SA 20 MEQ tablet Commonly known as: KLOR-CON Take 20 mEq by mouth daily.   pravastatin 40 MG tablet Commonly known as:  PRAVACHOL Take 40 mg by mouth every other day.      Follow-up Information    [facility] Follow up on 10/13/2019.   Specialty: Cardiology Why: at 12:00pm. Enter through the [place] entrance Contact information: [place] [phone]       Corey Skains, MD Follow up in 1 week(s).   Specialty: Cardiology Contact information: [facility] [place] [phone]          Allergies  Allergen Reactions  . Penicillins Swelling    Consultations: Cardio, Dr. Ubaldo Glassing   Procedures/Studies: DG Chest 2 View  Result Date: 10/02/2019 CLINICAL DATA:  86 year old female with shortness of breath. EXAM: CHEST - 2 VIEW COMPARISON:  Chest radiograph dated 04/26/2018. FINDINGS: There is diffuse interstitial coarsening and bronchitic changes. Left mid to lower lung field interstitial and streaky densities may represent combination of atelectasis and infiltrate. Clinical correlation is recommended. No  large pleural effusion or pneumothorax. Stable cardiomegaly. Atherosclerotic calcification of the aorta. No acute osseous pathology. Degenerative changes of the spine. IMPRESSION: Left mid to lower lung field atelectasis densities concerning for pneumonia. Clinical correlation is recommended. Electronically Signed   By: Anner Crete M.D.   On: 10/02/2019 17:46   CT Chest Wo Contrast  Result Date: 10/02/2019 CLINICAL DATA:  Shortness of breath for several weeks, bilateral lower extremity edema, wheezing EXAM: CT CHEST WITHOUT CONTRAST TECHNIQUE: Multidetector CT imaging of the chest was performed following the standard protocol without IV contrast. COMPARISON:  10/02/2019, 04/27/2018 FINDINGS: Cardiovascular: Borderline cardiomegaly unchanged. No pericardial effusion. There is extensive atherosclerosis of the thoracic aorta, stable. Minimal  atherosclerosis of the LAD distribution of the coronary vasculature. Mediastinum/Nodes: Stable borderline enlarged mediastinal lymph nodes, largest in the precarinal region measuring 13 mm in short axis reference image 35/4. Thyroid, trachea, and esophagus are stable. Lungs/Pleura: There is diffuse interlobular septal thickening. Scattered mosaic attenuation of the lungs consistent with areas of air trapping. Scattered areas of scarring are seen primarily at the lung bases. No focal airspace disease. Trace bilateral pleural effusions. No pneumothorax. The central airways are patent. Upper Abdomen: No acute abnormality. Musculoskeletal: No acute or destructive bony lesions. Reconstructed images demonstrate no additional findings. IMPRESSION: 1. Bibasilar scarring, with scattered areas of air trapping consistent with reactive airway disease. 2. Cardiomegaly with mild interlobular septal thickening and trace bilateral effusions could reflect an element of mild interstitial edema. 3. No acute airspace disease. 4.  Aortic Atherosclerosis (ICD10-I70.0). 5. Stable borderline enlarged mediastinal lymph nodes, likely reactive. Electronically Signed   By: Randa Ngo M.D.   On: 10/02/2019 18:37       Subjective: Pt denies any complaints   Discharge Exam: Vitals:   10/06/19 0947 10/06/19 1122  BP:  (!) 110/48  Pulse: 73 62  Resp:  19  Temp:  98.6 F (37 C)  SpO2:  93%   Vitals:   10/06/19 0624 10/06/19 0805 10/06/19 0947 10/06/19 1122  BP: (!) 141/48 (!) 127/54  (!) 110/48  Pulse: 67 67 73 62  Resp: 16 18  19   Temp: 98.2 F (36.8 C) 98.3 F (36.8 C)  98.6 F (37 C)  TempSrc: Oral Oral  Oral  SpO2: 95% 92%  93%  Weight: 93.3 kg     Height:        General: Pt is alert, awake, not in acute distress Cardiovascular: S1/S2+, no rubs, no gallops Respiratory: diminished breath sounds b/l Abdominal: Soft, NT, ND, bowel sounds + Extremities:  no cyanosis    The results of significant  diagnostics from this hospitalization (including imaging, microbiology, ancillary and laboratory) are listed below for reference.     Microbiology: Recent Results (from the past 240 hour(s))  SARS Coronavirus 2 by RT PCR (hospital order, performed in [facility] hospital lab) Nasopharyngeal Nasopharyngeal Swab     Status: None   Collection Time: 10/02/19  8:05 PM   Specimen: Nasopharyngeal Swab  Result Value Ref Range Status   SARS Coronavirus 2 NEGATIVE NEGATIVE Final    Comment: (NOTE) SARS-CoV-2 target nucleic acids are NOT DETECTED. The SARS-CoV-2 RNA is generally detectable in upper and lower respiratory specimens during the acute phase of infection. The lowest concentration of SARS-CoV-2 viral copies this assay can detect is 250 copies / mL. A negative result does not preclude SARS-CoV-2 infection and should not be used as the sole basis for treatment or other patient management decisions.  A negative result may  occur with improper specimen collection / handling, submission of specimen other than nasopharyngeal swab, presence of viral mutation(s) within the areas targeted by this assay, and inadequate number of viral copies (<250 copies / mL). A negative result must be combined with clinical observations, patient history, and epidemiological information. Fact Sheet for Patients:   StrictlyIdeas.no Fact Sheet for Healthcare Providers: BankingDealers.co.za This test is not yet approved or cleared  by the [place] FDA and has been authorized for detection and/or diagnosis of SARS-CoV-2 by FDA under an Emergency Use Authorization (EUA).  This EUA will remain in effect (meaning this test can be used) for the duration of the COVID-19 declaration under Section 564(b)(1) of the Act, 21 U.S.C. section 360bbb-3(b)(1), unless the authorization is terminated or revoked sooner. Performed at [facility], [address]  [place], [place] [address]   MRSA PCR Screening     Status: None   Collection Time: 10/03/19 12:18 PM   Specimen: Nasal Mucosa; Nasopharyngeal  Result Value Ref Range Status   MRSA by PCR NEGATIVE NEGATIVE Final    Comment:        The GeneXpert MRSA Assay (FDA approved for NASAL specimens only), is one component of a comprehensive MRSA colonization surveillance program. It is not intended to diagnose MRSA infection nor to guide or monitor treatment for MRSA infections. Performed at [facility], [address] [place],  [address]      Labs: BNP (last 3 results) Recent Labs    10/03/19 1151 10/05/19 0701 10/06/19 0411  BNP 634.9* 601.0* 327.6*   Basic Metabolic Panel: Recent Labs  Lab 10/02/19 1652 10/03/19 0449 10/04/19 0632 10/05/19 0701 10/06/19 0411  NA 140 142 143 144 143  K 3.9 3.8 3.5 3.7 3.7  CL 108 107 108 107 108  CO2 23 27 26 26 28   GLUCOSE 143* 127* 109* 102* 108*  BUN 41* 38* 34* 35* 41*  CREATININE 2.32* 2.08* 1.61* 1.59* 1.52*  CALCIUM 8.8* 8.7* 9.0 9.2 8.9  MG  --   --   --   --  2.3   Liver Function Tests: No results for input(s): AST, ALT, ALKPHOS, BILITOT, PROT, ALBUMIN in the last 168 hours. No results for input(s): LIPASE, AMYLASE in the last 168 hours. No results for input(s): AMMONIA in the last 168 hours. CBC: Recent Labs  Lab 10/02/19 1652 10/03/19 0449 10/04/19 0632 10/05/19 0701 10/06/19 0411  WBC 5.8 6.7 6.8 7.9 7.3  NEUTROABS  --  4.8  --   --   --   HGB 10.6* 10.0* 10.3* 10.4* 10.1*  HCT 33.9* 31.8* 32.4* 33.3* 31.9*  MCV 89.7 89.6 88.0 90.2 87.9  PLT 215 199 197 199 187   Cardiac Enzymes: No results for input(s): CKTOTAL, CKMB, CKMBINDEX, TROPONINI in the last 168 hours. BNP: Invalid input(s): POCBNP CBG: Recent Labs  Lab 10/05/19 1157 10/05/19 1633 10/05/19 2126 10/06/19 0805 10/06/19 1122  GLUCAP 98 131* 107* 94 145*   D-Dimer No results for input(s): DDIMER in the last 72 hours. Hgb  A1c No results for input(s): HGBA1C in the last 72 hours. Lipid Profile No results for input(s): CHOL, HDL, LDLCALC, TRIG, CHOLHDL, LDLDIRECT in the last 72 hours. Thyroid function studies No results for input(s): TSH, T4TOTAL, T3FREE, THYROIDAB in the last 72 hours.  Invalid input(s): FREET3 Anemia work up No results for input(s): VITAMINB12, FOLATE, FERRITIN, TIBC, IRON, RETICCTPCT in the last 72 hours. Urinalysis    Component Value Date/Time   COLORURINE YELLOW (  A) 05/07/2018 1802   APPEARANCEUR CLOUDY (A) 05/07/2018 1802   LABSPEC 1.010 05/07/2018 1802   PHURINE 6.0 05/07/2018 1802   GLUCOSEU NEGATIVE 05/07/2018 1802   HGBUR SMALL (A) 05/07/2018 1802   BILIRUBINUR NEGATIVE 05/07/2018 1802   KETONESUR NEGATIVE 05/07/2018 1802   PROTEINUR NEGATIVE 05/07/2018 1802   NITRITE POSITIVE (A) 05/07/2018 1802   LEUKOCYTESUR LARGE (A) 05/07/2018 1802   Sepsis Labs Invalid input(s): PROCALCITONIN,  WBC,  LACTICIDVEN Microbiology Recent Results (from the past 240 hour(s))  SARS Coronavirus 2 by RT PCR (hospital order, performed in [facility] hospital lab) Nasopharyngeal Nasopharyngeal Swab     Status: None   Collection Time: 10/02/19  8:05 PM   Specimen: Nasopharyngeal Swab  Result Value Ref Range Status   SARS Coronavirus 2 NEGATIVE NEGATIVE Final    Comment: (NOTE) SARS-CoV-2 target nucleic acids are NOT DETECTED. The SARS-CoV-2 RNA is generally detectable in upper and lower respiratory specimens during the acute phase of infection. The lowest concentration of SARS-CoV-2 viral copies this assay can detect is 250 copies / mL. A negative result does not preclude SARS-CoV-2 infection and should not be used as the sole basis for treatment or other patient management decisions.  A negative result may occur with improper specimen collection / handling, submission of specimen other than nasopharyngeal swab, presence of viral mutation(s) within the areas targeted by this assay, and  inadequate number of viral copies (<250 copies / mL). A negative result must be combined with clinical observations, patient history, and epidemiological information. Fact Sheet for Patients:   StrictlyIdeas.no Fact Sheet for Healthcare Providers: BankingDealers.co.za This test is not yet approved or cleared  by the [place] FDA and has been authorized for detection and/or diagnosis of SARS-CoV-2 by FDA under an Emergency Use Authorization (EUA).  This EUA will remain in effect (meaning this test can be used) for the duration of the COVID-19 declaration under Section 564(b)(1) of the Act, 21 U.S.C. section 360bbb-3(b)(1), unless the authorization is terminated or revoked sooner. Performed at [facility], [address] [place], [place] [address]   MRSA PCR Screening     Status: None   Collection Time: 10/03/19 12:18 PM   Specimen: Nasal Mucosa; Nasopharyngeal  Result Value Ref Range Status   MRSA by PCR NEGATIVE NEGATIVE Final    Comment:        The GeneXpert MRSA Assay (FDA approved for NASAL specimens only), is one component of a comprehensive MRSA colonization surveillance program. It is not intended to diagnose MRSA infection nor to guide or monitor treatment for MRSA infections. Performed at [facility], [address], [place], [place] [address]      Time coordinating discharge: Over 30 minutes  SIGNED:   Wyvonnia Dusky, MD  [facility] 10/06/2019, 3:04 PM Pager   If 7PM-7AM, please contact night-coverage [email or web address]

## 2019-10-06 NOTE — NC FL2 (Addendum)
[place] LEVEL OF CARE SCREENING TOOL     IDENTIFICATION  Patient Name: Destiny Oliver Birthdate: 17-Sep-1926 Sex: female Admission Date (Current Location): 10/02/2019  [place] and [place] Number:  Engineering geologist and Address:  [facility], [address] [place], [place] [address]      Provider Number: [id number]  Attending Physician Name and Address:  Wyvonnia Dusky, MD  Relative Name and Phone Number:       Current Level of Care: Hospital Recommended Level of Care: [place] Prior Approval Number:    Date Approved/Denied:   PASRR Number:    Discharge Plan: Domiciliary (Rest home)    Current Diagnoses: Patient Active Problem List   Diagnosis Date Noted  . Acute on chronic diastolic CHF (congestive heart failure) ([place]) 10/02/2019  . HCAP (healthcare-associated pneumonia) 05/23/2018  . UTI due to extended-spectrum beta lactamase (ESBL) producing Escherichia coli 05/15/2018  . Diplopia 05/08/2018  . Vertigo 05/08/2018  . Type 2 diabetes mellitus with stage 4 chronic kidney disease and hypertension ([place]) 05/02/2018  . Stage 4 chronic renal impairment associated with type 2 diabetes mellitus ([place]) 05/02/2018  . Hypertensive heart and kidney disease with chronic diastolic congestive heart failure and stage 4 chronic kidney disease ([place]) 05/02/2018  . Acute on chronic diastolic heart failure ([place]) 05/02/2018  . Dyslipidemia associated with type 2 diabetes mellitus ([place]) 05/02/2018  . Anemia associated with stage 4 chronic renal failure ([place]) 05/02/2018  . Acute on chronic respiratory failure with hypoxia ([place]) 05/02/2018  . Rhabdomyolysis 04/29/2018  . A-fib ([place]) 04/18/2018    Orientation RESPIRATION BLADDER Height & Weight     Self, Time, Situation, Place  Normal Continent Weight: 93.3 kg Height:  5\' 1"  (154.9 cm)  BEHAVIORAL SYMPTOMS/MOOD NEUROLOGICAL BOWEL NUTRITION STATUS      Continent Diet(Carb  Modified)  AMBULATORY STATUS COMMUNICATION OF NEEDS Skin   Limited Assist Verbally Normal                       Personal Care Assistance Level of Assistance  Bathing, Feeding, Dressing Bathing Assistance: Limited assistance Feeding assistance: Independent Dressing Assistance: Limited assistance     Functional Limitations Info  Sight, Speech, Hearing Sight Info: Adequate Hearing Info: Adequate Speech Info: Adequate    SPECIAL CARE FACTORS FREQUENCY  PT (By licensed PT), OT (By licensed OT)                    Contractures Contractures Info: Not present    Additional Factors Info  Code Status, Allergies Code Status Info: DNR Allergies Info: PCN           Discharge Medications: Please see discharge summary for a list of discharge medications. TAKE these medications   allopurinol 100 MG tablet Commonly known as: ZYLOPRIM Take 100 mg by mouth daily.   amiodarone 200 MG tablet Commonly known as: PACERONE Take 1 tablet (200 mg total) by mouth daily.   apixaban 2.5 MG Tabs tablet Commonly known as: ELIQUIS Take 1 tablet (2.5 mg total) by mouth 2 (two) times daily.   cholecalciferol 25 MCG (1000 UNIT) tablet Commonly known as: VITAMIN D3 Take 1,000 Units by mouth daily.   colchicine 0.6 MG tablet   [employer]S VITAMIN B12 1000 MCG tablet Generic drug: cyanocobalamin Take 1,000 mcg by mouth daily.   docusate sodium 100 MG capsule Commonly known as: COLACE Take 100 mg by mouth daily.   ENSURE ENLIVE PO Give  by mouth two times daily between meals   furosemide 40 MG tablet Commonly known as: LASIX Take 1 tablet (40 mg total) by mouth daily.   Hair Skin and Nails Formula Tabs Take 1 tablet by mouth daily.   Lactobacillus Tabs Take by mouth.   levothyroxine 75 MCG tablet Commonly known as: SYNTHROID Take 75 mcg by mouth daily.   metoprolol succinate 50 MG 24 hr tablet Commonly known as: TOPROL-XL Take 1 tablet (50 mg total) by mouth  daily. Take with or immediately following a meal. Start taking on: October 07, 2019 What changed:   medication strength  how much to take  additional instructions     polyethylene glycol powder 17 GM/SCOOP powder Commonly known as: GLYCOLAX/MIRALAX Take by mouth.   potassium chloride SA 20 MEQ tablet Commonly known as: KLOR-CON Take 20 mEq by mouth daily.   pravastatin 40 MG tablet Commonly known as: PRAVACHOL Take 40 mg by mouth every other day.      Relevant Imaging Results:  Relevant Lab Results:   Additional Information    Shelbie Ammons, RN

## 2019-10-07 NOTE — Progress Notes (Signed)
Patient will not need oxygen upon discharge from the hospital.   Verbal order Eppie Gibson, MD/ Jhonnie Garner, RNCM

## 2019-10-10 ENCOUNTER — Telehealth: Payer: Self-pay | Admitting: Family

## 2019-10-10 NOTE — Telephone Encounter (Signed)
Spoke with patient who said she is doing ok since her hospital discharge. She is having some swelling in legs but otherwise no other symptoms. She is taking her medications daily without issues, checking her weight daily, and following a heart healthy diet with minimal exercise. She confirmed her new patient CHF Clinic appointment for 6/14 at 12pm.   Alyse Low, NT

## 2019-10-11 NOTE — Progress Notes (Signed)
Patient ID: Destiny Oliver, female    DOB: 12-Oct-1926, 86 y.o.   MRN: [id number]  HPI  Destiny Oliver is a 86 y/o female with a history of DM, HTN, gout, hyperlipidemia, pulmonary HTN, CKD, breast cancer, paroxysmal atrial fibrillation and chronic heart failure.   Echo report from 10/01/19 reviewed and showed an EF of 55% along with moderate MR and severe TR.  Admitted 10/02/19 due to shortness of breath. Cardiology consult obtained. Initially given IV lasix with transition to oral diuretics. Oral HTN/ AF medications changed. PT/ OT consults obtained. Discharged after 4 days.   She presents today for her initial visit with a chief complaint of minimal shortness of breath upon moderate exertion. She describes this as chronic in nature having been present for several years. She has associated fatigue, cough, pedal edema, left arm pain and easy bruising along with this. She denies any difficulty sleeping, dizziness, abdominal distention, palpitations, chest pain or weight gain.   Currently wearing compression socks and is getting her legs wrapped at the facility tomorrow.   Past Medical History:  Diagnosis Date  . Arrhythmia    atrial fibrillation  . Breast cancer ([place]) 1994   right breast  . CHF (congestive heart failure) ([place])   . CKD (chronic kidney disease)   . Diabetes mellitus without complication ([place])   . Hyperlipidemia   . Hypertension   . Pulmonary HTN ([place])   . Tachycardia    Past Surgical History:  Procedure Laterality Date  . MASTECTOMY Right 1994   History reviewed. No pertinent family history. Social History   Tobacco Use  . Smoking status: Never Smoker  . Smokeless tobacco: Never Used  Substance Use Topics  . Alcohol use: Never   Allergies  Allergen Reactions  . Penicillins Swelling   Prior to Admission medications   Medication Sig Start Date End Date Taking? Authorizing Provider  allopurinol (ZYLOPRIM) 100 MG tablet Take 100 mg by mouth daily. 09/12/19  Yes  [provider]  amiodarone (PACERONE) 200 MG tablet Take 1 tablet (200 mg total) by mouth daily. 04/30/18  Yes Demetrios Loll, MD  apixaban (ELIQUIS) 2.5 MG TABS tablet Take 1 tablet (2.5 mg total) by mouth 2 (two) times daily. 04/30/18  Yes Demetrios Loll, MD  cholecalciferol (VITAMIN D3) 25 MCG (1000 UT) tablet Take 1,000 Units by mouth daily.   Yes [provider]  cyanocobalamin ([employer] VITAMIN B12) 1000 MCG tablet Take 1,000 mcg by mouth daily.    Yes [provider]  docusate sodium (COLACE) 100 MG capsule Take 100 mg by mouth daily.   Yes [provider]  furosemide (LASIX) 40 MG tablet Take 1 tablet (40 mg total) by mouth daily. 10/06/19 11/05/19 Yes Wyvonnia Dusky, MD  Lactobacillus TABS Take 1 tablet by mouth in the morning, at noon, and at bedtime.    Yes [provider]  levothyroxine (SYNTHROID) 75 MCG tablet Take 75 mcg by mouth daily. 10/02/19  Yes [provider]  metoprolol succinate (TOPROL-XL) 50 MG 24 hr tablet Take 1 tablet (50 mg total) by mouth daily. Take with or immediately following a meal. 10/07/19 11/06/19 Yes Wyvonnia Dusky, MD  OXYGEN Inhale 2 L/min into the lungs continuous. 04/30/18  Yes [provider]  polyethylene glycol powder (GLYCOLAX/MIRALAX) 17 GM/SCOOP powder Take by mouth.   Yes [provider]  potassium chloride SA (KLOR-CON) 20 MEQ tablet Take 20 mEq by mouth daily. 09/12/19  Yes [provider]  pravastatin (PRAVACHOL)  40 MG tablet Take 40 mg by mouth every other day. 02/08/15  Yes [provider]  colchicine 0.6 MG tablet  05/20/19   [provider]  Multiple Vitamins-Minerals (HAIR SKIN AND NAILS FORMULA) TABS Take 1 tablet by mouth daily. Patient not taking: Reported on 10/13/2019    [provider]  Nutritional Supplements (ENSURE ENLIVE PO) Give by mouth two times daily between meals Patient not taking: Reported on 10/13/2019 04/30/18   [provider]    Review of Systems  Constitutional: Positive for fatigue. Negative for appetite change.  HENT: Negative for congestion, postnasal drip and sore throat.   Eyes: Negative.   Respiratory: Positive for cough and shortness of breath.   Cardiovascular: Positive for leg swelling. Negative for chest pain and palpitations.  Gastrointestinal: Negative for abdominal distention and abdominal pain.  Endocrine: Negative.   Genitourinary: Negative.   Musculoskeletal: Positive for arthralgias (left arm sometimes). Negative for back pain.  Skin: Negative.   Allergic/Immunologic: Negative.   Neurological: Negative for dizziness and light-headedness.  Hematological: Negative for adenopathy. Bruises/bleeds easily.  Psychiatric/Behavioral: Negative for dysphoric mood and sleep disturbance. The patient is not nervous/anxious.    Vitals:   10/13/19 1203  BP: (!) 169/97  Pulse: 71  Resp: 16  SpO2: 97%  Weight: 207 lb (93.9 kg)  Height: 5\' 1"  (1.549 m)   Wt Readings from Last 3 Encounters:  10/13/19 207 lb (93.9 kg)  10/06/19 205 lb 11.2 oz (93.3 kg)  05/28/18 188 lb 6.4 oz (85.5 kg)   Lab Results  Component Value Date   CREATININE 1.52 (H) 10/06/2019   CREATININE 1.59 (H) 10/05/2019   CREATININE 1.61 (H) 10/04/2019     Physical Exam Vitals and nursing note reviewed.  Constitutional:      Appearance: She is well-developed.  HENT:     Head: Normocephalic and atraumatic.  Neck:     Vascular: No JVD.  Cardiovascular:     Rate and Rhythm: Normal rate and regular rhythm.  Pulmonary:     Effort: Pulmonary effort is normal. No respiratory distress.     Breath sounds: No wheezing or rales.  Musculoskeletal:     Cervical back: Normal range of motion and neck supple.     Right lower leg: No tenderness. Edema (2+ pitting) present.     Left lower leg: No tenderness. Edema (2+ pitting) present.  Skin:    General: Skin is warm and dry.  Neurological:     General: No focal  deficit present.     Mental Status: She is alert and oriented to person, place, and time.  Psychiatric:        Mood and Affect: Mood normal.        Behavior: Behavior normal.     Assessment & Plan:  1: Chronic heart failure with preserved ejection fraction without structural changes- - NYHA class II - euvolemic today - weighing daily at [facility]; order written for them to call for an overnight weight gain of >2 pounds or a weekly weight gain of > 5 pounds - not adding salt to her food but isn't sure if the food is cooked with salt - saw cardiology Nehemiah Massed) 10/01/19 & returns in 2 days - was to have legs wrapped today but she wasn't able to get her shoes on so compression socks were put on and her legs will be wrapped tomorrow - does report elevating her legs when sitting for long periods of time - BNP 10/06/19 was  391.1  2: HTN- - BP elevated today but she admits that she was nervous coming today - follows with PCP at [facility] 10/06/19 reviewed and showed sodium 143, potassium 3.7, creatinine 1.52 and GFR 30 - says that she's had lab work done since her d/c; will request those results as patient may benefit from losartan  3: DM- - A1c 10/03/19 was 6.3%  4: CKD- - saw nephrology Maia Breslow) 09/03/19   Facility medication list was reviewed.   Return in 3 months or sooner for any questions/problems before then.

## 2019-10-13 ENCOUNTER — Ambulatory Visit: Payer: Medicare Other | Attending: Family | Admitting: Family

## 2019-10-13 ENCOUNTER — Other Ambulatory Visit: Payer: Self-pay

## 2019-10-13 ENCOUNTER — Encounter: Payer: Self-pay | Admitting: Family

## 2019-10-13 VITALS — BP 169/97 | HR 71 | Resp 16 | Ht 61.0 in | Wt 207.0 lb

## 2019-10-13 DIAGNOSIS — E1122 Type 2 diabetes mellitus with diabetic chronic kidney disease: Secondary | ICD-10-CM | POA: Insufficient documentation

## 2019-10-13 DIAGNOSIS — I130 Hypertensive heart and chronic kidney disease with heart failure and stage 1 through stage 4 chronic kidney disease, or unspecified chronic kidney disease: Secondary | ICD-10-CM | POA: Diagnosis not present

## 2019-10-13 DIAGNOSIS — I5032 Chronic diastolic (congestive) heart failure: Secondary | ICD-10-CM | POA: Insufficient documentation

## 2019-10-13 DIAGNOSIS — N184 Chronic kidney disease, stage 4 (severe): Secondary | ICD-10-CM

## 2019-10-13 DIAGNOSIS — I272 Pulmonary hypertension, unspecified: Secondary | ICD-10-CM | POA: Diagnosis not present

## 2019-10-13 DIAGNOSIS — Z853 Personal history of malignant neoplasm of breast: Secondary | ICD-10-CM | POA: Diagnosis not present

## 2019-10-13 DIAGNOSIS — E785 Hyperlipidemia, unspecified: Secondary | ICD-10-CM | POA: Insufficient documentation

## 2019-10-13 DIAGNOSIS — Z880 Allergy status to penicillin: Secondary | ICD-10-CM | POA: Diagnosis not present

## 2019-10-13 DIAGNOSIS — Z79899 Other long term (current) drug therapy: Secondary | ICD-10-CM | POA: Insufficient documentation

## 2019-10-13 DIAGNOSIS — Z7901 Long term (current) use of anticoagulants: Secondary | ICD-10-CM | POA: Insufficient documentation

## 2019-10-13 DIAGNOSIS — I10 Essential (primary) hypertension: Secondary | ICD-10-CM

## 2019-10-13 DIAGNOSIS — I480 Paroxysmal atrial fibrillation: Secondary | ICD-10-CM | POA: Diagnosis not present

## 2019-10-13 DIAGNOSIS — N1832 Chronic kidney disease, stage 3b: Secondary | ICD-10-CM | POA: Diagnosis not present

## 2019-10-13 DIAGNOSIS — M109 Gout, unspecified: Secondary | ICD-10-CM | POA: Diagnosis not present

## 2019-10-13 NOTE — Patient Instructions (Signed)
Continue weighing daily and call for an overnight weight gain of > 2 pounds or a weekly weight gain of >5 pounds. 

## 2020-01-12 NOTE — Progress Notes (Signed)
Patient ID: Destiny Oliver, female    DOB: 1926-08-10, 86 y.o.   MRN: [id number]  HPI  Destiny Oliver is a 86 y/o female with a history of DM, HTN, gout, hyperlipidemia, pulmonary HTN, CKD, breast cancer, paroxysmal atrial fibrillation and chronic heart failure.   Echo report from 10/01/19 reviewed and showed an EF of 55% along with moderate MR and severe TR.  Admitted 10/02/19 due to shortness of breath. Cardiology consult obtained. Initially given IV lasix with transition to oral diuretics. Oral HTN/ AF medications changed. PT/ OT consults obtained. Discharged after 4 days.   She presents today for a follow-up visit with a chief complaint of minimal shortness of breath upon moderate exertion. She describes this as chronic in nature having been present for several years. She has associated fatigue, cough, pedal edema (improving), easy bruising and some left arm pain. She denies any difficulty sleeping, dizziness, abdominal distention, palpitations, chest pain or weight gain.   Past Medical History:  Diagnosis Date   Arrhythmia    atrial fibrillation   Breast cancer ([place]) 1994   right breast   CHF (congestive heart failure) ([facility])    CKD (chronic kidney disease)    Diabetes mellitus without complication ([place])    Hyperlipidemia    Hypertension    Pulmonary HTN ([place])    Tachycardia    Past Surgical History:  Procedure Laterality Date   MASTECTOMY Right 1994   No family history on file. Social History   Tobacco Use   Smoking status: Never Smoker   Smokeless tobacco: Never Used  Substance Use Topics   Alcohol use: Never   Allergies  Allergen Reactions   Penicillins Swelling   Prior to Admission medications   Medication Sig Start Date End Date Taking? Authorizing Provider  allopurinol (ZYLOPRIM) 100 MG tablet Take 100 mg by mouth daily. 09/12/19  Yes [provider]  amiodarone (PACERONE) 200 MG tablet Take 1 tablet (200 mg total) by mouth daily. 04/30/18  Yes  Demetrios Loll, MD  apixaban (ELIQUIS) 2.5 MG TABS tablet Take 1 tablet (2.5 mg total) by mouth 2 (two) times daily. 04/30/18  Yes Demetrios Loll, MD  cholecalciferol (VITAMIN D3) 25 MCG (1000 UT) tablet Take 1,000 Units by mouth daily.   Yes [provider]  cyanocobalamin ([employer] VITAMIN B12) 1000 MCG tablet Take 1,000 mcg by mouth daily.    Yes [provider]  docusate sodium (COLACE) 100 MG capsule Take 100 mg by mouth daily.   Yes [provider]  furosemide (LASIX) 40 MG tablet Take 1 tablet (40 mg total) by mouth daily. Patient taking differently: Take 40 mg by mouth daily. And 1/2 tablet in PM 10/06/19 01/13/20 Yes Wyvonnia Dusky, MD  hydrALAZINE (APRESOLINE) 25 MG tablet Take 25 mg by mouth 4 (four) times daily.   Yes [provider]  Lactobacillus TABS Take 1 tablet by mouth in the morning, at noon, and at bedtime.    Yes [provider]  levothyroxine (SYNTHROID) 75 MCG tablet Take 75 mcg by mouth daily. 10/02/19  Yes [provider]  metoprolol succinate (TOPROL-XL) 50 MG 24 hr tablet Take 1 tablet (50 mg total) by mouth daily. Take with or immediately following a meal. 10/07/19 01/13/20 Yes Wyvonnia Dusky, MD  Multiple Vitamins-Minerals (HAIR SKIN AND NAILS FORMULA) TABS Take 1 tablet by mouth daily.    Yes [provider]  Nutritional Supplements (ENSURE ENLIVE PO) Give by mouth two times daily between meals  04/30/18  Yes [provider]  polyethylene glycol powder (GLYCOLAX/MIRALAX) 17 GM/SCOOP powder Take by mouth.   Yes [provider]  potassium chloride SA (KLOR-CON) 20 MEQ tablet Take 20 mEq by mouth daily. 09/12/19  Yes [provider]  pravastatin (PRAVACHOL) 40 MG tablet Take 40 mg by mouth every other day. 02/08/15  Yes [provider]  telmisartan (MICARDIS) 40 MG tablet Take 40 mg by mouth daily.   Yes [provider]  colchicine 0.6 MG tablet  05/20/19   [provider]  OXYGEN Inhale 2 L/min into the lungs continuous. 04/30/18   [provider]    Review of Systems  Constitutional: Positive for fatigue. Negative for appetite change.  HENT: Negative for congestion, postnasal drip and sore throat.   Eyes: Negative.   Respiratory: Positive for cough and shortness of breath.   Cardiovascular: Positive for leg swelling (improving). Negative for chest pain and palpitations.  Gastrointestinal: Negative for abdominal distention and abdominal pain.  Endocrine: Negative.   Genitourinary: Negative.   Musculoskeletal: Positive for arthralgias (left arm sometimes). Negative for back pain.  Skin: Negative.   Allergic/Immunologic: Negative.   Neurological: Negative for dizziness and light-headedness.  Hematological: Negative for adenopathy. Bruises/bleeds easily.  Psychiatric/Behavioral: Negative for dysphoric mood and sleep disturbance. The patient is not nervous/anxious.    Vitals:   01/13/20 1010  BP: (!) 130/58  Pulse: 71  Resp: 18  SpO2: 96%  Weight: 206 lb (93.4 kg)  Height: 5' (1.524 m)  HC: 61" (154.9 cm)   Wt Readings from Last 3 Encounters:  01/13/20 206 lb (93.4 kg)  10/13/19 207 lb (93.9 kg)  10/06/19 205 lb 11.2 oz (93.3 kg)   Lab Results  Component Value Date   CREATININE 1.52 (H) 10/06/2019   CREATININE 1.59 (H) 10/05/2019   CREATININE 1.61 (H) 10/04/2019    Physical Exam Vitals and nursing note reviewed.  Constitutional:      Appearance: She is well-developed.  HENT:     Head: Normocephalic and atraumatic.  Neck:     Vascular: No JVD.  Cardiovascular:     Rate and Rhythm: Normal rate and regular rhythm.  Pulmonary:     Effort: Pulmonary effort is normal. No respiratory distress.     Breath sounds: No wheezing or rales.  Musculoskeletal:     Cervical back: Normal range of motion and neck supple.     Right lower leg: No tenderness. Edema (1+ pitting) present.     Left lower leg: No tenderness. Edema (2+ pitting)  present.  Skin:    General: Skin is warm and dry.  Neurological:     General: No focal deficit present.     Mental Status: She is alert and oriented to person, place, and time.  Psychiatric:        Mood and Affect: Mood normal.        Behavior: Behavior normal.     Assessment & Plan:  1: Chronic heart failure with preserved ejection fraction without structural changes- - NYHA class II - euvolemic today - weighing daily at [facility]; reminded to call for an overnight weight gain of >2 pounds or a weekly weight gain of > 5 pounds - weight down 1 pound from last visit here 3 months ago - not adding salt to her food but isn't sure if the food is cooked with salt - saw cardiology Zenia Resides) 01/12/20 - does report elevating her legs when sitting for long periods of time - BNP 10/06/19  was 391.1  2: HTN- - BP looks good today - follows with PCP at [facility] 12/05/19 reviewed and showed sodium 139, potassium 4.2, creatinine 1.95 and GFR 22  3: DM- - A1c 10/03/19 was 6.3% - saw nephrology (Kolluru) 12/17/19   Facility medication list was reviewed.   Due to HF stability, patient opts to not make a return appointment at this time. Advised her that she could call back at anytime to schedule another appointment and patient was comfortable with this plan.

## 2020-01-13 ENCOUNTER — Other Ambulatory Visit: Payer: Self-pay

## 2020-01-13 ENCOUNTER — Ambulatory Visit: Payer: Medicare Other | Attending: Family | Admitting: Family

## 2020-01-13 ENCOUNTER — Encounter: Payer: Self-pay | Admitting: Family

## 2020-01-13 VITALS — BP 130/58 | HR 71 | Resp 18 | Ht 60.0 in | Wt 206.0 lb

## 2020-01-13 DIAGNOSIS — R05 Cough: Secondary | ICD-10-CM | POA: Diagnosis not present

## 2020-01-13 DIAGNOSIS — I480 Paroxysmal atrial fibrillation: Secondary | ICD-10-CM | POA: Diagnosis not present

## 2020-01-13 DIAGNOSIS — I5032 Chronic diastolic (congestive) heart failure: Secondary | ICD-10-CM | POA: Insufficient documentation

## 2020-01-13 DIAGNOSIS — N189 Chronic kidney disease, unspecified: Secondary | ICD-10-CM | POA: Diagnosis not present

## 2020-01-13 DIAGNOSIS — I130 Hypertensive heart and chronic kidney disease with heart failure and stage 1 through stage 4 chronic kidney disease, or unspecified chronic kidney disease: Secondary | ICD-10-CM | POA: Diagnosis not present

## 2020-01-13 DIAGNOSIS — E785 Hyperlipidemia, unspecified: Secondary | ICD-10-CM | POA: Insufficient documentation

## 2020-01-13 DIAGNOSIS — E1122 Type 2 diabetes mellitus with diabetic chronic kidney disease: Secondary | ICD-10-CM | POA: Insufficient documentation

## 2020-01-13 DIAGNOSIS — M79602 Pain in left arm: Secondary | ICD-10-CM | POA: Insufficient documentation

## 2020-01-13 DIAGNOSIS — Z79899 Other long term (current) drug therapy: Secondary | ICD-10-CM | POA: Insufficient documentation

## 2020-01-13 DIAGNOSIS — I272 Pulmonary hypertension, unspecified: Secondary | ICD-10-CM | POA: Insufficient documentation

## 2020-01-13 DIAGNOSIS — M109 Gout, unspecified: Secondary | ICD-10-CM | POA: Diagnosis not present

## 2020-01-13 DIAGNOSIS — R0602 Shortness of breath: Secondary | ICD-10-CM | POA: Diagnosis present

## 2020-01-13 DIAGNOSIS — Z7901 Long term (current) use of anticoagulants: Secondary | ICD-10-CM | POA: Insufficient documentation

## 2020-01-13 DIAGNOSIS — I10 Essential (primary) hypertension: Secondary | ICD-10-CM

## 2020-01-13 DIAGNOSIS — R5383 Other fatigue: Secondary | ICD-10-CM | POA: Diagnosis not present

## 2020-01-13 NOTE — Patient Instructions (Addendum)
Continue weighing daily and call for an overnight weight gain of > 2 pounds or a weekly weight gain of >5 pounds.   Call us in the future if you need us 

## 2020-03-19 ENCOUNTER — Other Ambulatory Visit: Payer: Self-pay

## 2020-03-19 ENCOUNTER — Encounter: Payer: Self-pay | Admitting: Emergency Medicine

## 2020-03-19 ENCOUNTER — Emergency Department
Admission: EM | Admit: 2020-03-19 | Discharge: 2020-03-19 | Disposition: A | Discharge status: Home / Self Care | Payer: Medicare Other | Attending: Emergency Medicine | Admitting: Emergency Medicine

## 2020-03-19 ENCOUNTER — Emergency Department: Payer: Medicare Other

## 2020-03-19 DIAGNOSIS — Z853 Personal history of malignant neoplasm of breast: Secondary | ICD-10-CM | POA: Insufficient documentation

## 2020-03-19 DIAGNOSIS — N184 Chronic kidney disease, stage 4 (severe): Secondary | ICD-10-CM | POA: Diagnosis not present

## 2020-03-19 DIAGNOSIS — Z20822 Contact with and (suspected) exposure to covid-19: Secondary | ICD-10-CM | POA: Diagnosis not present

## 2020-03-19 DIAGNOSIS — I130 Hypertensive heart and chronic kidney disease with heart failure and stage 1 through stage 4 chronic kidney disease, or unspecified chronic kidney disease: Secondary | ICD-10-CM | POA: Insufficient documentation

## 2020-03-19 DIAGNOSIS — I4891 Unspecified atrial fibrillation: Secondary | ICD-10-CM | POA: Insufficient documentation

## 2020-03-19 DIAGNOSIS — Z79899 Other long term (current) drug therapy: Secondary | ICD-10-CM | POA: Diagnosis not present

## 2020-03-19 DIAGNOSIS — Z7901 Long term (current) use of anticoagulants: Secondary | ICD-10-CM | POA: Insufficient documentation

## 2020-03-19 DIAGNOSIS — I5033 Acute on chronic diastolic (congestive) heart failure: Secondary | ICD-10-CM | POA: Diagnosis not present

## 2020-03-19 DIAGNOSIS — R0602 Shortness of breath: Secondary | ICD-10-CM | POA: Insufficient documentation

## 2020-03-19 DIAGNOSIS — E1122 Type 2 diabetes mellitus with diabetic chronic kidney disease: Secondary | ICD-10-CM | POA: Insufficient documentation

## 2020-03-19 LAB — TROPONIN I (HIGH SENSITIVITY)
Troponin I (High Sensitivity): 31 ng/L — ABNORMAL HIGH (ref <18)
Troponin I (High Sensitivity): 46 ng/L — ABNORMAL HIGH (ref <18)

## 2020-03-19 LAB — COMPREHENSIVE METABOLIC PANEL
ALT: 31 U/L (ref 0–44)
AST: 38 U/L (ref 15–41)
Albumin: 3.4 g/dL — ABNORMAL LOW (ref 3.5–5.0)
Alkaline Phosphatase: 98 U/L (ref 38–126)
Anion gap: 10 (ref 5–15)
BUN: 33 mg/dL — ABNORMAL HIGH (ref 8–23)
CO2: 20 mmol/L — ABNORMAL LOW (ref 22–32)
Calcium: 8.7 mg/dL — ABNORMAL LOW (ref 8.9–10.3)
Chloride: 106 mmol/L (ref 98–111)
Creatinine, Ser: 2.11 mg/dL — ABNORMAL HIGH (ref 0.44–1.00)
GFR, Estimated: 22 mL/min — ABNORMAL LOW (ref >60)
Glucose, Bld: 158 mg/dL — ABNORMAL HIGH (ref 70–99)
Potassium: 4.1 mmol/L (ref 3.5–5.1)
Sodium: 136 mmol/L (ref 135–145)
Total Bilirubin: 0.7 mg/dL (ref 0.3–1.2)
Total Protein: 6.7 g/dL (ref 6.5–8.1)

## 2020-03-19 LAB — CBC WITH DIFFERENTIAL/PLATELET
Abs Immature Granulocytes: 0.03 10*3/uL (ref 0.00–0.07)
Basophils Absolute: 0 10*3/uL (ref 0.0–0.1)
Basophils Relative: 0 %
Eosinophils Absolute: 0.1 10*3/uL (ref 0.0–0.5)
Eosinophils Relative: 1 %
HCT: 34 % — ABNORMAL LOW (ref 36.0–46.0)
Hemoglobin: 10.3 g/dL — ABNORMAL LOW (ref 12.0–15.0)
Immature Granulocytes: 1 %
Lymphocytes Relative: 22 %
Lymphs Abs: 1 10*3/uL (ref 0.7–4.0)
MCH: 26.2 pg (ref 26.0–34.0)
MCHC: 30.3 g/dL (ref 30.0–36.0)
MCV: 86.5 fL (ref 80.0–100.0)
Monocytes Absolute: 0.6 10*3/uL (ref 0.1–1.0)
Monocytes Relative: 12 %
Neutro Abs: 3.1 10*3/uL (ref 1.7–7.7)
Neutrophils Relative %: 64 %
Platelets: 188 10*3/uL (ref 150–400)
RBC: 3.93 MIL/uL (ref 3.87–5.11)
RDW: 16.6 % — ABNORMAL HIGH (ref 11.5–15.5)
WBC: 4.8 10*3/uL (ref 4.0–10.5)
nRBC: 0.4 % — ABNORMAL HIGH (ref 0.0–0.2)

## 2020-03-19 LAB — RESP PANEL BY RT-PCR (FLU A&B, COVID) ARPGX2
Influenza A by PCR: NEGATIVE
Influenza B by PCR: NEGATIVE
SARS Coronavirus 2 by RT PCR: NEGATIVE

## 2020-03-19 LAB — PROCALCITONIN: Procalcitonin: 0.11 ng/mL

## 2020-03-19 LAB — BRAIN NATRIURETIC PEPTIDE: B Natriuretic Peptide: 932.7 pg/mL — ABNORMAL HIGH (ref 0.0–100.0)

## 2020-03-19 NOTE — ED Notes (Signed)
EDP Goodman at bedside.  

## 2020-03-19 NOTE — ED Notes (Signed)
Pt ambulated to toilet at this time, tolerated well

## 2020-03-19 NOTE — ED Triage Notes (Addendum)
PT arrives via EMS with c/o of SOB starting 3 days ago after her covid booster shot.  Per EMS, pt was mid 80% on RA, EMS put pt on 4L and pt was 98%  PT denies any pain.    Pt states she does not wear O2 at home, PT currently stating at 93% on RA.

## 2020-03-19 NOTE — Discharge Instructions (Addendum)
Please seek medical attention for any high fevers, chest pain, shortness of breath, change in behavior, persistent vomiting, bloody stool or any other new or concerning symptoms.  

## 2020-03-19 NOTE — ED Notes (Signed)
X Ray at bedside at this time.  

## 2020-03-19 NOTE — ED Notes (Signed)
Pt assisted back to bed at this time, pt tolerated well

## 2020-03-19 NOTE — ED Notes (Signed)
Pt ambulating in room at this time per verbal order from [facility]. Pt maintained oxygen saturation throughout ambulation and pt stated she "feels fine" during ambulation with walker.

## 2020-03-19 NOTE — ED Provider Notes (Signed)
[facility] Emergency Department Provider Note  ____________________________________________   I have reviewed the triage vital signs and the nursing notes.   HISTORY  Chief Complaint Shortness of Breath   History limited by: Not Limited   HPI Destiny Oliver is a 86 y.o. female who presents to the emergency department today because of concern for shortness of breath. The patient states she received the covid vaccine booster 2 days ago. Since that time she has felt sick with nausea, vomiting and some shortness of breath. This morning the shortness of breath was acutely worse. The patient denies any associated chest pain or cough. Denies any fever. States that her shortness of breath has improved from what it was this morning. Says she has a history of going to the heart failure clinic however they told her she was doing well. She is on a liquid volume restriction. The patient has had some chronic leg swelling but denies any acute worsening of the swelling or leg pain.   Records reviewed. Per medical record review patient has a history of CHF, CKD, HLD, HTN.   Past Medical History:  Diagnosis Date  . Arrhythmia    atrial fibrillation  . Breast cancer ([place]) 1994   right breast  . CHF (congestive heart failure) ([place])   . CKD (chronic kidney disease)   . Diabetes mellitus without complication ([place])   . Hyperlipidemia   . Hypertension   . Pulmonary HTN ([place])   . Tachycardia     Patient Active Problem List   Diagnosis Date Noted  . Acute on chronic diastolic CHF (congestive heart failure) ([place]) 10/02/2019  . HCAP (healthcare-associated pneumonia) 05/23/2018  . UTI due to extended-spectrum beta lactamase (ESBL) producing Escherichia coli 05/15/2018  . Diplopia 05/08/2018  . Vertigo 05/08/2018  . Type 2 diabetes mellitus with stage 4 chronic kidney disease and hypertension ([place]) 05/02/2018  . Stage 4 chronic renal impairment associated with type 2 diabetes  mellitus ([place]) 05/02/2018  . Hypertensive heart and kidney disease with chronic diastolic congestive heart failure and stage 4 chronic kidney disease ([place]) 05/02/2018  . Acute on chronic diastolic heart failure ([place]) 05/02/2018  . Dyslipidemia associated with type 2 diabetes mellitus ([place]) 05/02/2018  . Anemia associated with stage 4 chronic renal failure ([place]) 05/02/2018  . Acute on chronic respiratory failure with hypoxia ([place]) 05/02/2018  . Rhabdomyolysis 04/29/2018  . A-fib ([place]) 04/18/2018    Past Surgical History:  Procedure Laterality Date  . MASTECTOMY Right 1994    Prior to Admission medications   Medication Sig Start Date End Date Taking? Authorizing Provider  allopurinol (ZYLOPRIM) 100 MG tablet Take 100 mg by mouth daily. 09/12/19   [provider]  amiodarone (PACERONE) 200 MG tablet Take 1 tablet (200 mg total) by mouth daily. 04/30/18   Demetrios Loll, MD  apixaban (ELIQUIS) 2.5 MG TABS tablet Take 1 tablet (2.5 mg total) by mouth 2 (two) times daily. 04/30/18   Demetrios Loll, MD  cholecalciferol (VITAMIN D3) 25 MCG (1000 UT) tablet Take 1,000 Units by mouth daily.    [provider]  colchicine 0.6 MG tablet  05/20/19   [provider]  cyanocobalamin (CVS VITAMIN B12) 1000 MCG tablet Take 1,000 mcg by mouth daily.     [provider]  docusate sodium (COLACE) 100 MG capsule Take 100 mg by mouth daily.    [provider]  furosemide (LASIX) 40 MG tablet Take 1 tablet (40 mg total) by mouth daily. Patient taking  differently: Take 40 mg by mouth daily. And 1/2 tablet in PM 10/06/19 01/13/20  Wyvonnia Dusky, MD  hydrALAZINE (APRESOLINE) 25 MG tablet Take 25 mg by mouth 4 (four) times daily.    [provider]  Lactobacillus TABS Take 1 tablet by mouth in the morning, at noon, and at bedtime.     [provider]  levothyroxine (SYNTHROID) 75 MCG tablet Take 75 mcg by mouth daily. 10/02/19   [provider]   metoprolol succinate (TOPROL-XL) 50 MG 24 hr tablet Take 1 tablet (50 mg total) by mouth daily. Take with or immediately following a meal. 10/07/19 01/13/20  Wyvonnia Dusky, MD  Multiple Vitamins-Minerals (HAIR SKIN AND NAILS FORMULA) TABS Take 1 tablet by mouth daily.     [provider]  Nutritional Supplements (ENSURE ENLIVE PO) Give by mouth two times daily between meals  04/30/18   [provider]  OXYGEN Inhale 2 L/min into the lungs continuous. 04/30/18   [provider]  polyethylene glycol powder (GLYCOLAX/MIRALAX) 17 GM/SCOOP powder Take by mouth.    [provider]  potassium chloride SA (KLOR-CON) 20 MEQ tablet Take 20 mEq by mouth daily. 09/12/19   [provider]  pravastatin (PRAVACHOL) 40 MG tablet Take 40 mg by mouth every other day. 02/08/15   [provider]  telmisartan (MICARDIS) 40 MG tablet Take 40 mg by mouth daily.    [provider]    Allergies Penicillins  No family history on file.  Social History Social History   Tobacco Use  . Smoking status: Never Smoker  . Smokeless tobacco: Never Used  Substance Use Topics  . Alcohol use: Never  . Drug use: Never    Review of Systems Constitutional: No fever/chills Eyes: No visual changes. ENT: No sore throat. Cardiovascular: Denies chest pain. Respiratory: Positive for shortness of breath. Gastrointestinal: Positive for nausea and vomiting.  Genitourinary: Negative for dysuria. Musculoskeletal: Negative for back pain. Skin: Negative for rash. Neurological: Negative for headaches, focal weakness or numbness.  ____________________________________________   PHYSICAL EXAM:  VITAL SIGNS: ED Triage Vitals  Enc Vitals Group     BP -- 102/57     Pulse -- 86     Resp -- 22     Temp --      Temp src --      SpO2 -- 93     Weight 03/19/20 1445 205 lb 14.6 oz (93.4 kg)     Height 03/19/20 1445 5' (1.524 m)     Head Circumference --      Peak  Flow --      Pain Score 03/19/20 1455 0   Constitutional: Alert and oriented.  Eyes: Conjunctivae are normal.  ENT      Head: Normocephalic and atraumatic.      Nose: No congestion/rhinnorhea.      Mouth/Throat: Mucous membranes are moist.      Neck: No stridor. Hematological/Lymphatic/Immunilogical: No cervical lymphadenopathy. Cardiovascular: Irregular rhythm.  No murmurs, rubs, or gallops.  Respiratory: Normal respiratory effort without tachypnea nor retractions. Breath sounds are clear and equal bilaterally. No wheezes/rales/rhonchi. Gastrointestinal: Soft and non tender. No rebound. No guarding.  Genitourinary: Deferred Musculoskeletal: Normal range of motion in all extremities. Trace bilateral edema.  Neurologic:  Normal speech and language. No gross focal neurologic deficits are appreciated.  Skin:  Skin is warm, dry and intact. No rash noted. Psychiatric: Mood and affect are normal. Speech and behavior are normal. Patient exhibits appropriate insight and  judgment.  ____________________________________________    LABS (pertinent positives/negatives)  CBC wbc 4.8, hgb 10.3, plt 188 Trop hs 31 to 46 CMP na 136, k 4.1, glu 158, cr 2.11 BNP 932.7 Procalcitonin 0.11 COVID negative ____________________________________________   EKG  I, Nance Pear, attending physician, personally viewed and interpreted this EKG  EKG Time: 1448 Rate: 91 Rhythm: atrial fibrillation Axis: normal Intervals: qtc 493 QRS: Low voltage precordial lead ST changes: no st elevation Impression: abnormal ekg       ____________________________________________    RADIOLOGY  CXR New right upper lung opacity.   ____________________________________________   PROCEDURES  Procedures  ____________________________________________   INITIAL IMPRESSION / ASSESSMENT AND PLAN / ED COURSE  Pertinent labs & imaging results that were available during my care of the patient were reviewed  by me and considered in my medical decision making (see chart for details).   Patient presented to the emergency department today because of concerns for episode of shortness of breath.  Patient states she has not been feeling well since receiving the Covid booster a few days ago.  This morning the patient did have shortness of breath however by the time my exam she stated she felt better.  Lungs were clear to auscultation.  Chest x-ray did show possible atelectasis versus infiltrate.  Patient did not have leukocytosis procalcitonin only 0.11.  Patient without cough or fever.  At this point I doubt pneumonia.  I did discuss this finding with the patient.  I do think atelectasis more likely.  Patient has a very mild elevation of her troponin.  In the setting of kidney disease as well as heart failure I have low suspicion that this represents ACS.  I did discuss this with the patient.  She denied any chest pain.  Additionally I doubt PE or dissection given lack of pain.  Patient was ambulated here in the emergency department and did feel better without any hypoxia.  This point I think is reasonable for patient be discharged home.  I did discuss with patient return precautions.  Discussed importance of close follow-up.  ____________________________________________   FINAL CLINICAL IMPRESSION(S) / ED DIAGNOSES  Final diagnoses:  SOB (shortness of breath)     Note: This dictation was prepared with Dragon dictation. Any transcriptional errors that result from this process are unintentional     Nance Pear, MD 03/19/20 1931

## 2021-03-31 ENCOUNTER — Encounter: Payer: Self-pay | Admitting: Emergency Medicine

## 2021-03-31 ENCOUNTER — Other Ambulatory Visit: Payer: Self-pay

## 2021-03-31 ENCOUNTER — Emergency Department
Admission: EM | Admit: 2021-03-31 | Discharge: 2021-03-31 | Disposition: A | Discharge status: Home / Self Care | Payer: Medicare Other | Attending: Emergency Medicine | Admitting: Emergency Medicine

## 2021-03-31 ENCOUNTER — Emergency Department: Payer: Medicare Other

## 2021-03-31 DIAGNOSIS — I5033 Acute on chronic diastolic (congestive) heart failure: Secondary | ICD-10-CM | POA: Diagnosis not present

## 2021-03-31 DIAGNOSIS — Z7901 Long term (current) use of anticoagulants: Secondary | ICD-10-CM | POA: Diagnosis not present

## 2021-03-31 DIAGNOSIS — I130 Hypertensive heart and chronic kidney disease with heart failure and stage 1 through stage 4 chronic kidney disease, or unspecified chronic kidney disease: Secondary | ICD-10-CM | POA: Diagnosis not present

## 2021-03-31 DIAGNOSIS — N184 Chronic kidney disease, stage 4 (severe): Secondary | ICD-10-CM | POA: Diagnosis not present

## 2021-03-31 DIAGNOSIS — I509 Heart failure, unspecified: Secondary | ICD-10-CM

## 2021-03-31 DIAGNOSIS — I4891 Unspecified atrial fibrillation: Secondary | ICD-10-CM | POA: Diagnosis not present

## 2021-03-31 DIAGNOSIS — E1122 Type 2 diabetes mellitus with diabetic chronic kidney disease: Secondary | ICD-10-CM | POA: Insufficient documentation

## 2021-03-31 DIAGNOSIS — Z853 Personal history of malignant neoplasm of breast: Secondary | ICD-10-CM | POA: Insufficient documentation

## 2021-03-31 DIAGNOSIS — R0602 Shortness of breath: Secondary | ICD-10-CM | POA: Diagnosis present

## 2021-03-31 DIAGNOSIS — Z79899 Other long term (current) drug therapy: Secondary | ICD-10-CM | POA: Diagnosis not present

## 2021-03-31 LAB — BASIC METABOLIC PANEL
Anion gap: 6 (ref 5–15)
BUN: 26 mg/dL — ABNORMAL HIGH (ref 8–23)
CO2: 23 mmol/L (ref 22–32)
Calcium: 9.1 mg/dL (ref 8.9–10.3)
Chloride: 109 mmol/L (ref 98–111)
Creatinine, Ser: 1.57 mg/dL — ABNORMAL HIGH (ref 0.44–1.00)
GFR, Estimated: 31 mL/min — ABNORMAL LOW (ref >60)
Glucose, Bld: 113 mg/dL — ABNORMAL HIGH (ref 70–99)
Potassium: 3.9 mmol/L (ref 3.5–5.1)
Sodium: 138 mmol/L (ref 135–145)

## 2021-03-31 LAB — BRAIN NATRIURETIC PEPTIDE: B Natriuretic Peptide: 481.7 pg/mL — ABNORMAL HIGH (ref 0.0–100.0)

## 2021-03-31 LAB — TROPONIN I (HIGH SENSITIVITY)
Troponin I (High Sensitivity): 13 ng/L (ref <18)
Troponin I (High Sensitivity): 14 ng/L (ref <18)

## 2021-03-31 LAB — CBC
HCT: 34.8 % — ABNORMAL LOW (ref 36.0–46.0)
Hemoglobin: 10.4 g/dL — ABNORMAL LOW (ref 12.0–15.0)
MCH: 26.8 pg (ref 26.0–34.0)
MCHC: 29.9 g/dL — ABNORMAL LOW (ref 30.0–36.0)
MCV: 89.7 fL (ref 80.0–100.0)
Platelets: 253 10*3/uL (ref 150–400)
RBC: 3.88 MIL/uL (ref 3.87–5.11)
RDW: 17.7 % — ABNORMAL HIGH (ref 11.5–15.5)
WBC: 5.8 10*3/uL (ref 4.0–10.5)
nRBC: 0 % (ref 0.0–0.2)

## 2021-03-31 LAB — PROTIME-INR
INR: 1.2 (ref 0.8–1.2)
Prothrombin Time: 15.5 seconds — ABNORMAL HIGH (ref 11.4–15.2)

## 2021-03-31 MED ORDER — FUROSEMIDE 10 MG/ML IJ SOLN
40.0000 mg | Freq: Once | INTRAMUSCULAR | Status: AC
  Administered 2021-03-31: 40 mg via INTRAVENOUS
  Filled 2021-03-31: qty 4

## 2021-03-31 NOTE — ED Provider Notes (Signed)
[facility] Emergency Department Provider Note  ____________________________________________   I have reviewed the triage vital signs and the nursing notes.   HISTORY  Chief Complaint Shortness of breath  History limited by: Not Limited   HPI Destiny Oliver is a 87 y.o. female who presents to the emergency department today at the advice of living facilities physicians assistant because of concerns for shortness of breath.  Patient states that she has been having shortness of breath for the past 5 days.  This has been accompanied by some intermittent sharp pain to her left chest.  She has also noticed some lower extremity swelling.  Patient states that she has a history of heart failure.  Is on furosemide.  She denies any fevers.   Records reviewed. Per medical record review patient has a history of CHF  Past Medical History:  Diagnosis Date   Arrhythmia    atrial fibrillation   Breast cancer ([place]) 1994   right breast   CHF (congestive heart failure) ([place])    CKD (chronic kidney disease)    Diabetes mellitus without complication ([place])    Hyperlipidemia    Hypertension    Pulmonary HTN ([place])    Tachycardia     Patient Active Problem List   Diagnosis Date Noted   Acute on chronic diastolic CHF (congestive heart failure) ([place]) 10/02/2019   HCAP (healthcare-associated pneumonia) 05/23/2018   UTI due to extended-spectrum beta lactamase (ESBL) producing Escherichia coli 05/15/2018   Diplopia 05/08/2018   Vertigo 05/08/2018   Type 2 diabetes mellitus with stage 4 chronic kidney disease and hypertension ([place]) 05/02/2018   Stage 4 chronic renal impairment associated with type 2 diabetes mellitus ([place]) 05/02/2018   Hypertensive heart and kidney disease with chronic diastolic congestive heart failure and stage 4 chronic kidney disease ([place]) 05/02/2018   Acute on chronic diastolic heart failure ([place]) 05/02/2018   Dyslipidemia associated with type 2 diabetes  mellitus ([place]) 05/02/2018   Anemia associated with stage 4 chronic renal failure ([place]) 05/02/2018   Acute on chronic respiratory failure with hypoxia ([place]) 05/02/2018   Rhabdomyolysis 04/29/2018   A-fib ([place]) 04/18/2018    Past Surgical History:  Procedure Laterality Date   MASTECTOMY Right 1994    Prior to Admission medications   Medication Sig Start Date End Date Taking? Authorizing Provider  allopurinol (ZYLOPRIM) 100 MG tablet Take 100 mg by mouth daily. 09/12/19   [provider]  amiodarone (PACERONE) 200 MG tablet Take 1 tablet (200 mg total) by mouth daily. 04/30/18   Demetrios Loll, MD  apixaban (ELIQUIS) 2.5 MG TABS tablet Take 1 tablet (2.5 mg total) by mouth 2 (two) times daily. 04/30/18   Demetrios Loll, MD  cholecalciferol (VITAMIN D3) 25 MCG (1000 UT) tablet Take 1,000 Units by mouth daily.    [provider]  colchicine 0.6 MG tablet  05/20/19   [provider]  cyanocobalamin (CVS VITAMIN B12) 1000 MCG tablet Take 1,000 mcg by mouth daily.     [provider]  docusate sodium (COLACE) 100 MG capsule Take 100 mg by mouth daily.    [provider]  furosemide (LASIX) 40 MG tablet Take 1 tablet (40 mg total) by mouth daily. Patient taking differently: Take 40 mg by mouth daily. And 1/2 tablet in PM 10/06/19 01/13/20  Wyvonnia Dusky, MD  hydrALAZINE (APRESOLINE) 25 MG tablet Take 25 mg by mouth 4 (four) times daily.    [provider]  Lactobacillus TABS Take 1 tablet  by mouth in the morning, at noon, and at bedtime.     [provider]  levothyroxine (SYNTHROID) 75 MCG tablet Take 75 mcg by mouth daily. 10/02/19   [provider]  metoprolol succinate (TOPROL-XL) 50 MG 24 hr tablet Take 1 tablet (50 mg total) by mouth daily. Take with or immediately following a meal. 10/07/19 01/13/20  Wyvonnia Dusky, MD  Multiple Vitamins-Minerals (HAIR SKIN AND NAILS FORMULA) TABS Take 1 tablet by mouth daily.     [provider]  Nutritional Supplements (ENSURE ENLIVE PO) Give by mouth two times daily between meals  04/30/18   [provider]  OXYGEN Inhale 2 L/min into the lungs continuous. 04/30/18   [provider]  polyethylene glycol powder (GLYCOLAX/MIRALAX) 17 GM/SCOOP powder Take by mouth.    [provider]  potassium chloride SA (KLOR-CON) 20 MEQ tablet Take 20 mEq by mouth daily. 09/12/19   [provider]  pravastatin (PRAVACHOL) 40 MG tablet Take 40 mg by mouth every other day. 02/08/15   [provider]  telmisartan (MICARDIS) 40 MG tablet Take 40 mg by mouth daily.    [provider]    Allergies Penicillins  History reviewed. No pertinent family history.  Social History Social History   Tobacco Use   Smoking status: Never   Smokeless tobacco: Never  Substance Use Topics   Alcohol use: Never   Drug use: Never    Review of Systems Constitutional: No fever/chills Eyes: No visual changes. ENT: No sore throat. Cardiovascular: Positive for intermittent chest pain. Respiratory: Positive for shortness of breath. Gastrointestinal: No abdominal pain.  No nausea, no vomiting.  No diarrhea.   Genitourinary: Negative for dysuria. Musculoskeletal: Positive for lower extremity edema.  Skin: Negative for rash. Neurological: Negative for headaches, focal weakness or numbness.  ____________________________________________   PHYSICAL EXAM:  VITAL SIGNS: ED Triage Vitals [03/31/21 1103]  Enc Vitals Group     BP (!) 141/91     Pulse Rate (!) 104     Resp 18     Temp 98 F (36.7 C)     Temp Source Oral     SpO2 94 %     Weight 205 lb 14.6 oz (93.4 kg)     Height 5' (1.524 m)     Head Circumference      Peak Flow      Pain Score 0   Constitutional: Alert and oriented.  Eyes: Conjunctivae are normal.  ENT      Head: Normocephalic and atraumatic.      Nose: No congestion/rhinnorhea.      Mouth/Throat: Mucous membranes  are moist.      Neck: No stridor. Hematological/Lymphatic/Immunilogical: No cervical lymphadenopathy. Cardiovascular: Normal rate, regular rhythm.  No murmurs, rubs, or gallops.  Respiratory: Normal respiratory effort without tachypnea nor retractions. Breath sounds are clear and equal bilaterally. No wheezes/rales/rhonchi. Gastrointestinal: Soft and non tender. No rebound. No guarding.  Genitourinary: Deferred Musculoskeletal: Normal range of motion in all extremities. Bilateral lower extremity edema.  Neurologic:  Normal speech and language. No gross focal neurologic deficits are appreciated.  Skin:  Skin is warm, dry and intact. No rash noted. Psychiatric: Mood and affect are normal. Speech and behavior are normal. Patient exhibits appropriate insight and judgment.  ____________________________________________    LABS (pertinent positives/negatives)  Trop hs 13 to 14 CBC wbc 5.8, hgb 10.4, plt 253 BMP wnl glu 113, bun 26, cr 1.57 ____________________________________________   EKG  I, Nance Pear,  attending physician, personally viewed and interpreted this EKG  EKG Time: 1106 Rate: 78 Rhythm: atrial fibrillation Axis: right superior axis deviation Intervals: qtc 442 QRS: narrow ST changes: no st elevation Impression: abnormal ekg   ____________________________________________    RADIOLOGY  CXR Mild pulmonary edema.    ____________________________________________   PROCEDURES  Procedures  ____________________________________________   INITIAL IMPRESSION / ASSESSMENT AND PLAN / ED COURSE  Pertinent labs & imaging results that were available during my care of the patient were reviewed by me and considered in my medical decision making (see chart for details).   Patient presented to the emergency department today because of concerns for shortness of breath.  Patient does have history of heart failure.  On exam she had mild lower extremity edema.  Chest  x-ray showed some mild pulmonary edema.  Patient was given IV Lasix here with good urine output.  She did feel improvement in her symptoms.  At this point I think is reasonable for patient to be discharged home.  Did discuss with patient increasing Lasix over the next couple days as well as her potassium.  Will give patient heart failure clinic follow-up information.  ____________________________________________   FINAL CLINICAL IMPRESSION(S) / ED DIAGNOSES  Final diagnoses:  Acute on chronic congestive heart failure, unspecified heart failure type [facility])     Note: This dictation was prepared with Dragon dictation. Any transcriptional errors that result from this process are unintentional     Nance Pear, MD 03/31/21 2135

## 2021-03-31 NOTE — Discharge Instructions (Signed)
Please double your morning Lasix (Furosemide) for the next two days, in addition please double your Klor-Con (Potassium Chloride) for the next two days. Please seek medical attention for any high fevers, chest pain, shortness of breath, change in behavior, persistent vomiting, bloody stool or any other new or concerning symptoms.

## 2021-03-31 NOTE — ED Triage Notes (Signed)
Chest pain since Saturday feels the same that she had when she had pericarditis. Gave 4 baby ASA, 180/110, heart rate 120, 97% RA CBG 137. Chest pain is currently gone

## 2021-03-31 NOTE — ED Notes (Signed)
R

## 2021-03-31 NOTE — ED Triage Notes (Signed)
Pt comes into the ED via [facility] from home c/o left side chest pain that started Saturday.  Pt also explains she is having increased SHOB.  Pt does have a cardiac history including having CHF.  Pt admits she has also had increased swelling in her legs.  Pt currently has even and unlabored respirations at this time and is in NAD.

## 2021-03-31 NOTE — ED Provider Notes (Signed)
Emergency Medicine Provider Triage Evaluation Note  Destiny Oliver, a 87 y.o. female who presents from home via EMS, was evaluated in triage.  Pt complains of left-sided chest pain with onset on Saturday, with some associated shortness of breath.  Patient has a cardiac history including CHF, note some increased warmth in legs bilaterally.  She denies any fevers, chills, or sweats.  She denies any current chest pain at this time..  Review of Systems  Positive: CP, SOB Negative: FCS  Physical Exam  BP (!) 156/99 (BP Location: Right Arm)   Pulse 60   Temp 98 F (36.7 C) (Oral)   Resp 18   Ht 5' (1.524 m)   Wt 93.4 kg   SpO2 96%   BMI 40.21 kg/m  Gen:   Awake, no distress  NAD Resp:  Normal effort CTA MSK:   Moves extremities without difficulty  Other:  CVS: RRR  Medical Decision Making  Medically screening exam initiated at 2:08 PM.  Appropriate orders placed.  SYA NESTLER was informed that the remainder of the evaluation will be completed by another provider, this initial triage assessment does not replace that evaluation, and the importance of remaining in the ED until their evaluation is complete.  Geriatric patient ED evaluation of chest pain and associated shortness of breath since Saturday.   Melvenia Needles, PA-C 03/31/21 1412    Lucrezia Starch, MD 03/31/21 [id number]

## 2021-04-05 NOTE — Progress Notes (Deleted)
Patient ID: Destiny Oliver, female    DOB: December 24, 1926, 87 y.o.   MRN: [id number]  HPI  Ms Bert is a 87 y/o female with a history of DM, HTN, gout, hyperlipidemia, pulmonary HTN, CKD, breast cancer, paroxysmal atrial fibrillation and chronic heart failure.   Echo report from 10/01/19 reviewed and showed an EF of 55% along with moderate MR and severe TR.  Was in the ED 03/31/21 due to shortness of breath along with pedal edema. Given IV lasix with improvement of symptoms and she was released.   She presents today for a follow-up visit with a chief complaint of   Past Medical History:  Diagnosis Date   Arrhythmia    atrial fibrillation   Breast cancer ([place]) 1994   right breast   CHF (congestive heart failure) ([place])    CKD (chronic kidney disease)    Diabetes mellitus without complication ([place])    Hyperlipidemia    Hypertension    Pulmonary HTN ([place])    Tachycardia    Past Surgical History:  Procedure Laterality Date   MASTECTOMY Right 1994   No family history on file. Social History   Tobacco Use   Smoking status: Never   Smokeless tobacco: Never  Substance Use Topics   Alcohol use: Never   Allergies  Allergen Reactions   Penicillins Swelling     Review of Systems  Constitutional:  Positive for fatigue. Negative for appetite change.  HENT:  Negative for congestion, postnasal drip and sore throat.   Eyes: Negative.   Respiratory:  Positive for cough and shortness of breath.   Cardiovascular:  Positive for leg swelling (improving). Negative for chest pain and palpitations.  Gastrointestinal:  Negative for abdominal distention and abdominal pain.  Endocrine: Negative.   Genitourinary: Negative.   Musculoskeletal:  Positive for arthralgias (left arm sometimes). Negative for back pain.  Skin: Negative.   Allergic/Immunologic: Negative.   Neurological:  Negative for dizziness and light-headedness.  Hematological:  Negative for adenopathy. Bruises/bleeds easily.   Psychiatric/Behavioral:  Negative for dysphoric mood and sleep disturbance. The patient is not nervous/anxious.      Physical Exam Vitals and nursing note reviewed.  Constitutional:      Appearance: She is well-developed.  HENT:     Head: Normocephalic and atraumatic.  Neck:     Vascular: No JVD.  Cardiovascular:     Rate and Rhythm: Normal rate and regular rhythm.  Pulmonary:     Effort: Pulmonary effort is normal. No respiratory distress.     Breath sounds: No wheezing or rales.  Musculoskeletal:     Cervical back: Normal range of motion and neck supple.     Right lower leg: No tenderness. Edema (1+ pitting) present.     Left lower leg: No tenderness. Edema (2+ pitting) present.  Skin:    General: Skin is warm and dry.  Neurological:     General: No focal deficit present.     Mental Status: She is alert and oriented to person, place, and time.  Psychiatric:        Mood and Affect: Mood normal.        Behavior: Behavior normal.    Assessment & Plan:  1: Chronic heart failure with preserved ejection fraction without structural changes- - NYHA class II - euvolemic today - weighing daily at [facility]; reminded to call for an overnight weight gain of >2 pounds or a weekly weight gain of > 5 pounds - weight down 1 pound from  last visit here 3 months ago - not adding salt to her food but isn't sure if the food is cooked with salt - saw cardiology Nehemiah Massed) 09/29/20 - does report elevating her legs when sitting for long periods of time - BNP 03/31/21 was 481.7  2: HTN- - BP  - follows with PCP at [place] 03/31/21 reviewed and showed sodium 138, potassium 3.9, creatinine 1.57 and GFR 31  3: DM- - A1c 10/03/19 was 6.3% - saw nephrology ([place]) 12/27/20   Facility medication list was reviewed.

## 2021-04-06 ENCOUNTER — Telehealth: Payer: Self-pay | Admitting: Family

## 2021-04-06 ENCOUNTER — Ambulatory Visit: Payer: Medicare Other | Admitting: Family

## 2021-04-06 NOTE — Telephone Encounter (Signed)
Patient did not show for her Heart Failure Clinic appointment on 04/06/21. Will attempt to reschedule.

## 2022-01-05 IMAGING — CR DG CHEST 2V
2 series · 2 of 2 positions shown · non-contrast
Comparison: March 19, 2020.

CLINICAL DATA: Chest pain.

EXAM:
CHEST - 2 VIEW

[chest lat]
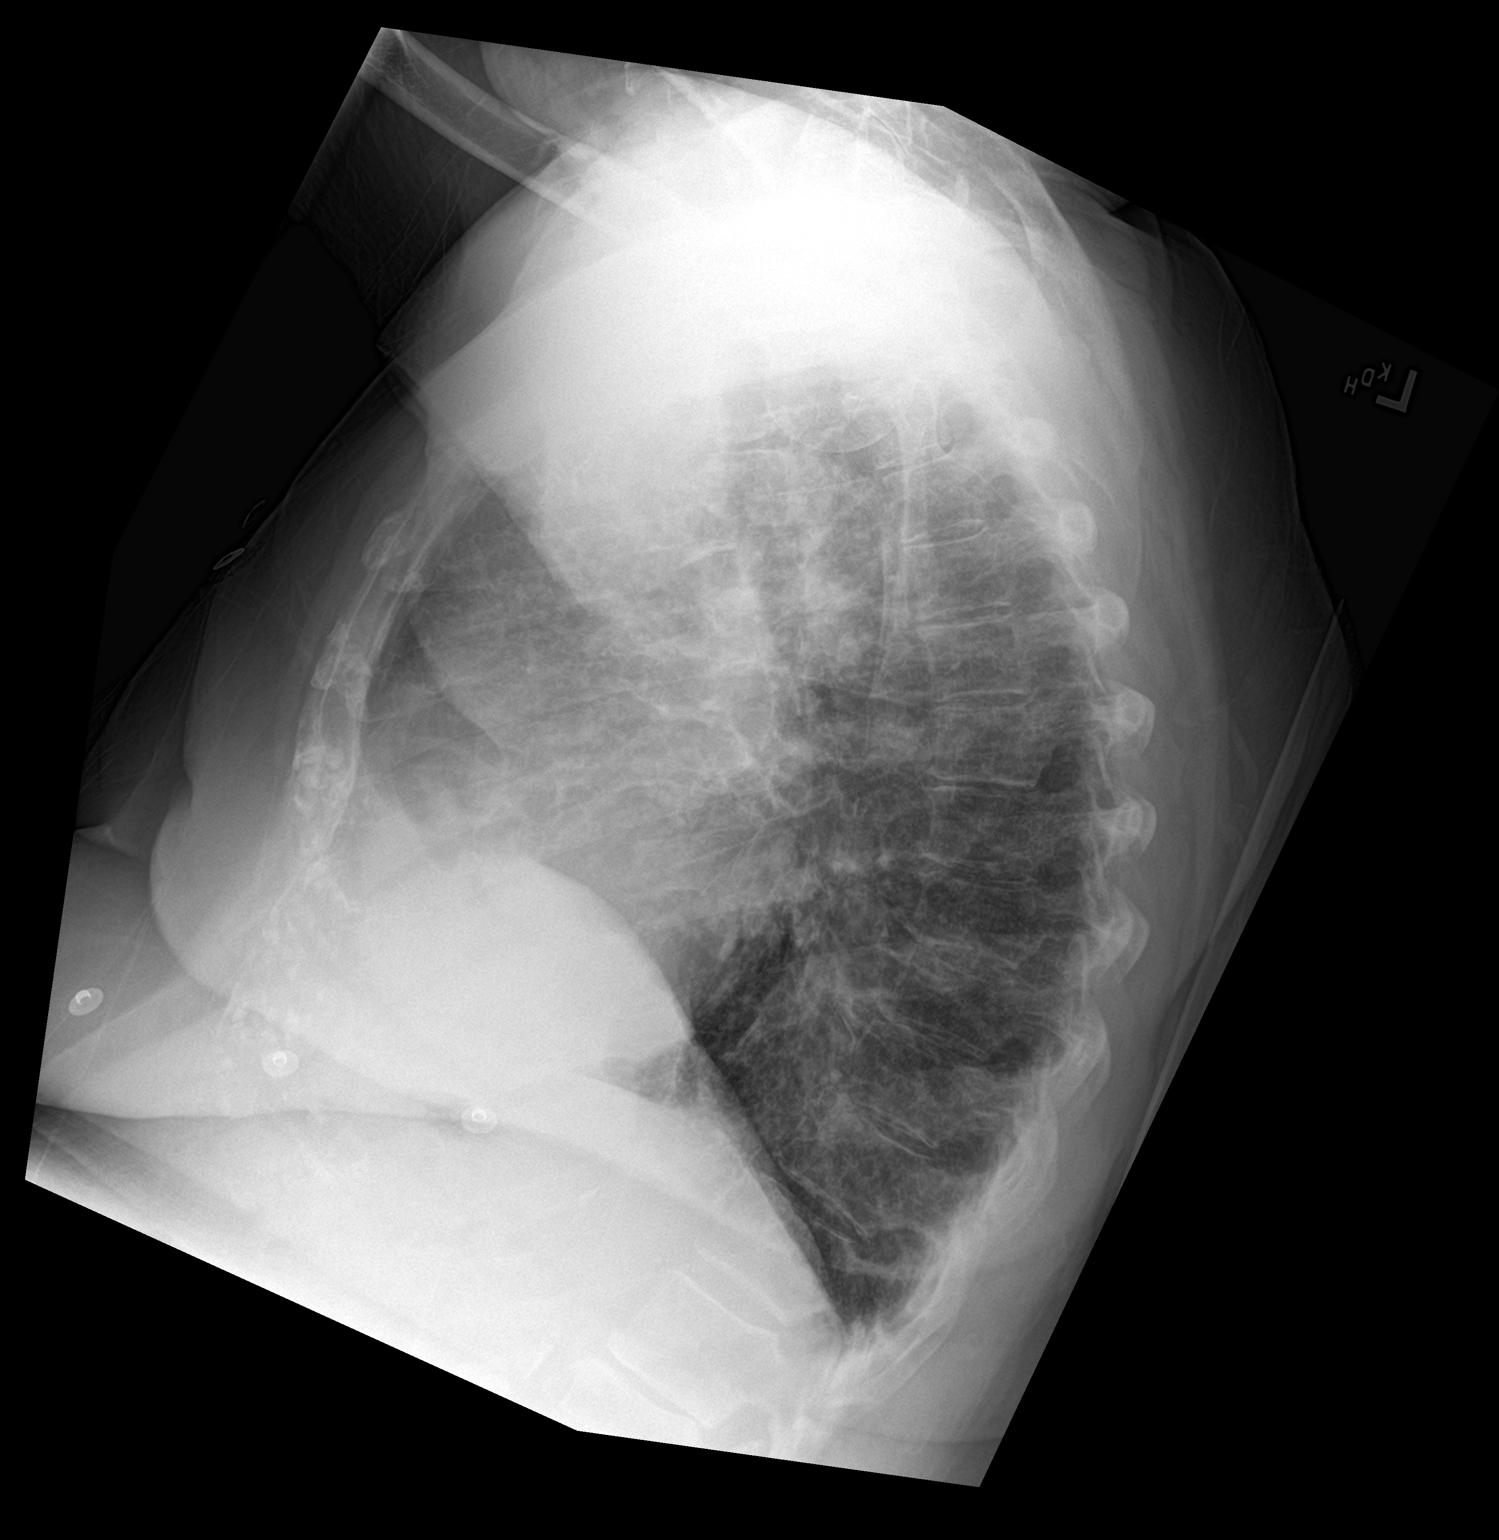

[chest ap]
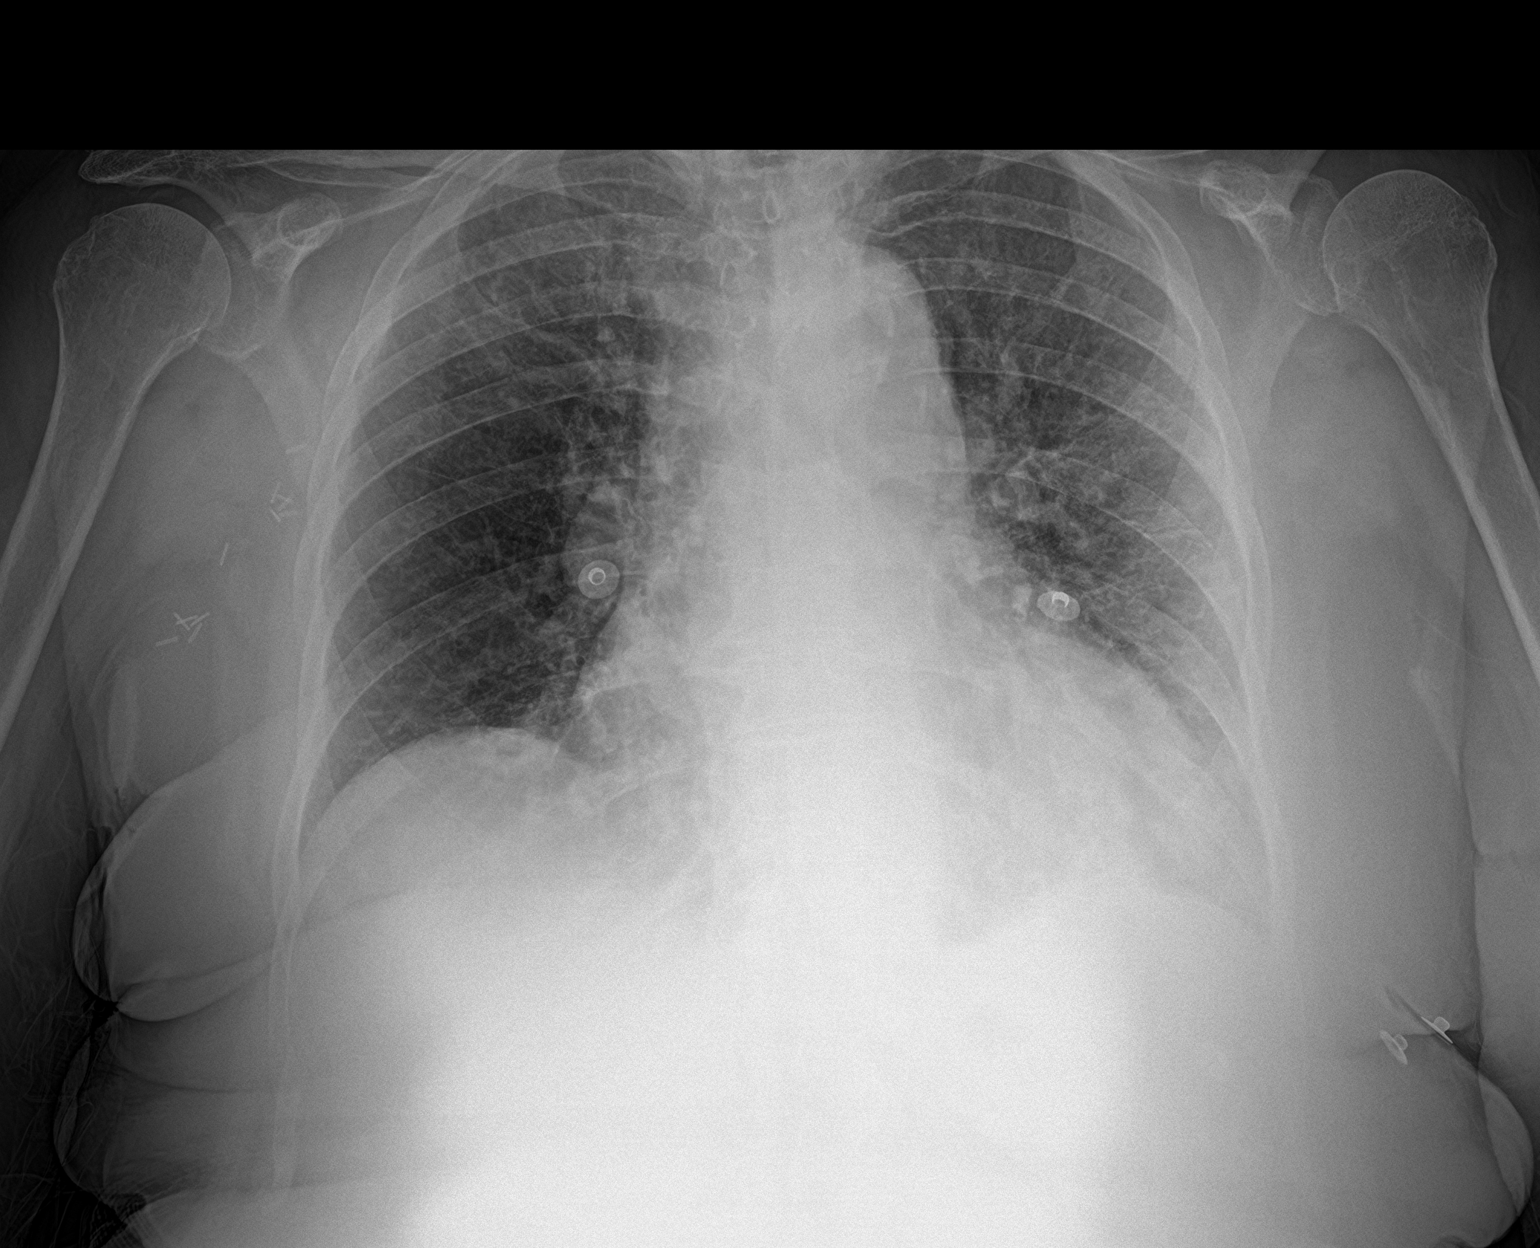

[2 of 2 positions shown; findings below may reference images not displayed]

FINDINGS: Mild cardiomegaly is noted with mild central pulmonary vascular
congestion. Minimal bilateral pulmonary edema may be present. Bony
thorax is unremarkable.
IMPRESSION: Mild cardiomegaly with mild central pulmonary vascular congestion
and minimal bilateral pulmonary edema.

## 2022-03-09 ENCOUNTER — Inpatient Hospital Stay: Payer: Medicare Other | Admitting: Anesthesiology

## 2022-03-09 ENCOUNTER — Other Ambulatory Visit: Payer: Self-pay

## 2022-03-09 ENCOUNTER — Inpatient Hospital Stay
Admission: EM | Admit: 2022-03-09 | Discharge: 2022-03-14 | DRG: 481 | Disposition: A | Discharge status: Skilled Nursing Facility | Payer: Medicare Other | Source: Skilled Nursing Facility | Attending: Student in an Organized Health Care Education/Training Program | Admitting: Student in an Organized Health Care Education/Training Program

## 2022-03-09 ENCOUNTER — Encounter: Payer: Self-pay | Admitting: Internal Medicine

## 2022-03-09 ENCOUNTER — Emergency Department: Payer: Medicare Other

## 2022-03-09 ENCOUNTER — Encounter
Admission: EM | Disposition: A | Discharge status: Skilled Nursing Facility | Payer: Self-pay | Source: Skilled Nursing Facility | Attending: Student in an Organized Health Care Education/Training Program

## 2022-03-09 ENCOUNTER — Inpatient Hospital Stay: Payer: Medicare Other

## 2022-03-09 DIAGNOSIS — S72142A Displaced intertrochanteric fracture of left femur, initial encounter for closed fracture: Secondary | ICD-10-CM | POA: Diagnosis present

## 2022-03-09 DIAGNOSIS — N179 Acute kidney failure, unspecified: Secondary | ICD-10-CM | POA: Diagnosis not present

## 2022-03-09 DIAGNOSIS — Y9301 Activity, walking, marching and hiking: Secondary | ICD-10-CM | POA: Diagnosis present

## 2022-03-09 DIAGNOSIS — E119 Type 2 diabetes mellitus without complications: Secondary | ICD-10-CM

## 2022-03-09 DIAGNOSIS — W19XXXA Unspecified fall, initial encounter: Secondary | ICD-10-CM

## 2022-03-09 DIAGNOSIS — J45909 Unspecified asthma, uncomplicated: Secondary | ICD-10-CM | POA: Diagnosis present

## 2022-03-09 DIAGNOSIS — E1122 Type 2 diabetes mellitus with diabetic chronic kidney disease: Secondary | ICD-10-CM | POA: Diagnosis present

## 2022-03-09 DIAGNOSIS — Z66 Do not resuscitate: Secondary | ICD-10-CM | POA: Diagnosis present

## 2022-03-09 DIAGNOSIS — I480 Paroxysmal atrial fibrillation: Secondary | ICD-10-CM | POA: Diagnosis present

## 2022-03-09 DIAGNOSIS — E039 Hypothyroidism, unspecified: Secondary | ICD-10-CM | POA: Insufficient documentation

## 2022-03-09 DIAGNOSIS — M25552 Pain in left hip: Secondary | ICD-10-CM | POA: Diagnosis present

## 2022-03-09 DIAGNOSIS — D631 Anemia in chronic kidney disease: Secondary | ICD-10-CM | POA: Diagnosis present

## 2022-03-09 DIAGNOSIS — E861 Hypovolemia: Secondary | ICD-10-CM | POA: Diagnosis not present

## 2022-03-09 DIAGNOSIS — I130 Hypertensive heart and chronic kidney disease with heart failure and stage 1 through stage 4 chronic kidney disease, or unspecified chronic kidney disease: Secondary | ICD-10-CM | POA: Diagnosis present

## 2022-03-09 DIAGNOSIS — I272 Pulmonary hypertension, unspecified: Secondary | ICD-10-CM | POA: Diagnosis present

## 2022-03-09 DIAGNOSIS — Z880 Allergy status to penicillin: Secondary | ICD-10-CM | POA: Diagnosis not present

## 2022-03-09 DIAGNOSIS — Z79890 Hormone replacement therapy (postmenopausal): Secondary | ICD-10-CM

## 2022-03-09 DIAGNOSIS — Z79899 Other long term (current) drug therapy: Secondary | ICD-10-CM | POA: Diagnosis not present

## 2022-03-09 DIAGNOSIS — Z7901 Long term (current) use of anticoagulants: Secondary | ICD-10-CM | POA: Diagnosis not present

## 2022-03-09 DIAGNOSIS — F0390 Unspecified dementia without behavioral disturbance: Secondary | ICD-10-CM | POA: Diagnosis present

## 2022-03-09 DIAGNOSIS — I9581 Postprocedural hypotension: Secondary | ICD-10-CM

## 2022-03-09 DIAGNOSIS — E785 Hyperlipidemia, unspecified: Secondary | ICD-10-CM | POA: Diagnosis present

## 2022-03-09 DIAGNOSIS — W010XXA Fall on same level from slipping, tripping and stumbling without subsequent striking against object, initial encounter: Secondary | ICD-10-CM | POA: Diagnosis present

## 2022-03-09 DIAGNOSIS — Z862 Personal history of diseases of the blood and blood-forming organs and certain disorders involving the immune mechanism: Secondary | ICD-10-CM

## 2022-03-09 DIAGNOSIS — D62 Acute posthemorrhagic anemia: Secondary | ICD-10-CM | POA: Diagnosis not present

## 2022-03-09 DIAGNOSIS — Z9011 Acquired absence of right breast and nipple: Secondary | ICD-10-CM | POA: Diagnosis not present

## 2022-03-09 DIAGNOSIS — Y92129 Unspecified place in nursing home as the place of occurrence of the external cause: Secondary | ICD-10-CM

## 2022-03-09 DIAGNOSIS — N184 Chronic kidney disease, stage 4 (severe): Secondary | ICD-10-CM | POA: Diagnosis present

## 2022-03-09 DIAGNOSIS — I5032 Chronic diastolic (congestive) heart failure: Secondary | ICD-10-CM | POA: Diagnosis present

## 2022-03-09 DIAGNOSIS — Z853 Personal history of malignant neoplasm of breast: Secondary | ICD-10-CM | POA: Diagnosis not present

## 2022-03-09 HISTORY — PX: INTRAMEDULLARY (IM) NAIL INTERTROCHANTERIC: SHX5875

## 2022-03-09 LAB — PROTIME-INR
INR: 1.2 (ref 0.8–1.2)
Prothrombin Time: 14.9 seconds (ref 11.4–15.2)

## 2022-03-09 LAB — CBC
HCT: 38.1 % (ref 36.0–46.0)
Hemoglobin: 11.9 g/dL — ABNORMAL LOW (ref 12.0–15.0)
MCH: 27.9 pg (ref 26.0–34.0)
MCHC: 31.2 g/dL (ref 30.0–36.0)
MCV: 89.4 fL (ref 80.0–100.0)
Platelets: 193 10*3/uL (ref 150–400)
RBC: 4.26 MIL/uL (ref 3.87–5.11)
RDW: 14.5 % (ref 11.5–15.5)
WBC: 7.3 10*3/uL (ref 4.0–10.5)
nRBC: 0 % (ref 0.0–0.2)

## 2022-03-09 LAB — CBG MONITORING, ED
Glucose-Capillary: 106 mg/dL — ABNORMAL HIGH (ref 70–99)
Glucose-Capillary: 106 mg/dL — ABNORMAL HIGH (ref 70–99)
Glucose-Capillary: 147 mg/dL — ABNORMAL HIGH (ref 70–99)

## 2022-03-09 LAB — COMPREHENSIVE METABOLIC PANEL
ALT: 15 U/L (ref 0–44)
AST: 21 U/L (ref 15–41)
Albumin: 3.7 g/dL (ref 3.5–5.0)
Alkaline Phosphatase: 85 U/L (ref 38–126)
Anion gap: 10 (ref 5–15)
BUN: 36 mg/dL — ABNORMAL HIGH (ref 8–23)
CO2: 21 mmol/L — ABNORMAL LOW (ref 22–32)
Calcium: 9.4 mg/dL (ref 8.9–10.3)
Chloride: 111 mmol/L (ref 98–111)
Creatinine, Ser: 1.87 mg/dL — ABNORMAL HIGH (ref 0.44–1.00)
GFR, Estimated: 25 mL/min — ABNORMAL LOW (ref >60)
Glucose, Bld: 150 mg/dL — ABNORMAL HIGH (ref 70–99)
Potassium: 3.7 mmol/L (ref 3.5–5.1)
Sodium: 142 mmol/L (ref 135–145)
Total Bilirubin: 0.8 mg/dL (ref 0.3–1.2)
Total Protein: 7 g/dL (ref 6.5–8.1)

## 2022-03-09 LAB — ABO/RH: ABO/RH(D): O NEG

## 2022-03-09 LAB — TROPONIN I (HIGH SENSITIVITY)
Troponin I (High Sensitivity): 15 ng/L (ref <18)
Troponin I (High Sensitivity): 14 ng/L (ref <18)

## 2022-03-09 LAB — HEMOGLOBIN A1C
Hgb A1c MFr Bld: 5.5 % (ref 4.8–5.6)
Mean Plasma Glucose: 111.15 mg/dL

## 2022-03-09 LAB — GLUCOSE, CAPILLARY: Glucose-Capillary: 168 mg/dL — ABNORMAL HIGH (ref 70–99)

## 2022-03-09 SURGERY — FIXATION, FRACTURE, INTERTROCHANTERIC, WITH INTRAMEDULLARY ROD
Anesthesia: General | Site: Hip | Laterality: Left

## 2022-03-09 MED ORDER — ONDANSETRON HCL 4 MG PO TABS
4.0000 mg | ORAL_TABLET | Freq: Four times a day (QID) | ORAL | Status: DC | PRN
  Administered 2022-03-10: 4 mg via ORAL
  Filled 2022-03-09: qty 1

## 2022-03-09 MED ORDER — OXYCODONE HCL 5 MG PO TABS: 5.0000 mg | ORAL_TABLET | Freq: Once | ORAL | Status: DC | PRN

## 2022-03-09 MED ORDER — TRANEXAMIC ACID-NACL 1000-0.7 MG/100ML-% IV SOLN: 1000.0000 mg | INTRAVENOUS | Status: AC

## 2022-03-09 MED ORDER — LACTATED RINGERS IV BOLUS
250.0000 mL | Freq: Once | INTRAVENOUS | Status: AC
  Administered 2022-03-09: 250 mL via INTRAVENOUS

## 2022-03-09 MED ORDER — TORSEMIDE 20 MG PO TABS
20.0000 mg | ORAL_TABLET | Freq: Every day | ORAL | Status: DC
  Administered 2022-03-09 – 2022-03-10 (×2): 20 mg via ORAL
  Filled 2022-03-09 (×2): qty 1

## 2022-03-09 MED ORDER — BUPIVACAINE LIPOSOME 1.3 % IJ SUSP
INTRAMUSCULAR | Status: AC
  Filled 2022-03-09: qty 20

## 2022-03-09 MED ORDER — MORPHINE SULFATE (PF) 2 MG/ML IV SOLN: 0.5000 mg | INTRAVENOUS | Status: DC | PRN

## 2022-03-09 MED ORDER — TRANEXAMIC ACID-NACL 1000-0.7 MG/100ML-% IV SOLN
INTRAVENOUS | Status: AC
  Administered 2022-03-09: 1000 mg via INTRAVENOUS
  Filled 2022-03-09: qty 100

## 2022-03-09 MED ORDER — FENTANYL CITRATE (PF) 100 MCG/2ML IJ SOLN
INTRAMUSCULAR | Status: AC
  Administered 2022-03-09: 25 ug via INTRAVENOUS
  Filled 2022-03-09: qty 2

## 2022-03-09 MED ORDER — HYDROCODONE-ACETAMINOPHEN 5-325 MG PO TABS
1.0000 | ORAL_TABLET | ORAL | Status: DC | PRN
  Administered 2022-03-10: 1 via ORAL

## 2022-03-09 MED ORDER — PHENYLEPHRINE HCL-NACL 20-0.9 MG/250ML-% IV SOLN
INTRAVENOUS | Status: DC | PRN
  Administered 2022-03-09: 40 ug/min via INTRAVENOUS

## 2022-03-09 MED ORDER — HYDROCODONE-ACETAMINOPHEN 5-325 MG PO TABS: 1.0000 | ORAL_TABLET | Freq: Four times a day (QID) | ORAL | Status: DC | PRN

## 2022-03-09 MED ORDER — ONDANSETRON HCL 4 MG/2ML IJ SOLN: 4.0000 mg | Freq: Four times a day (QID) | INTRAMUSCULAR | Status: DC | PRN

## 2022-03-09 MED ORDER — FENTANYL CITRATE (PF) 100 MCG/2ML IJ SOLN
INTRAMUSCULAR | Status: DC | PRN
  Administered 2022-03-09 (×2): 25 ug via INTRAVENOUS

## 2022-03-09 MED ORDER — ONDANSETRON HCL 4 MG/2ML IJ SOLN
INTRAMUSCULAR | Status: DC | PRN
  Administered 2022-03-09: 4 mg via INTRAVENOUS

## 2022-03-09 MED ORDER — POTASSIUM CHLORIDE CRYS ER 20 MEQ PO TBCR
20.0000 meq | EXTENDED_RELEASE_TABLET | Freq: Every day | ORAL | Status: DC
  Administered 2022-03-09 – 2022-03-14 (×6): 20 meq via ORAL
  Filled 2022-03-09 (×6): qty 1

## 2022-03-09 MED ORDER — FERROUS SULFATE 325 (65 FE) MG PO TABS
325.0000 mg | ORAL_TABLET | Freq: Every morning | ORAL | Status: DC
  Administered 2022-03-10 – 2022-03-14 (×5): 325 mg via ORAL
  Filled 2022-03-09 (×5): qty 1

## 2022-03-09 MED ORDER — ACETAMINOPHEN 10 MG/ML IV SOLN: 1000.0000 mg | Freq: Once | INTRAVENOUS | Status: DC | PRN

## 2022-03-09 MED ORDER — CEFAZOLIN SODIUM-DEXTROSE 2-4 GM/100ML-% IV SOLN
2.0000 g | Freq: Four times a day (QID) | INTRAVENOUS | Status: AC
  Administered 2022-03-09 – 2022-03-10 (×2): 2 g via INTRAVENOUS
  Filled 2022-03-09 (×2): qty 100

## 2022-03-09 MED ORDER — MORPHINE SULFATE (PF) 2 MG/ML IV SOLN
2.0000 mg | Freq: Once | INTRAVENOUS | Status: AC
  Administered 2022-03-09: 2 mg via INTRAVENOUS
  Filled 2022-03-09: qty 1

## 2022-03-09 MED ORDER — INSULIN ASPART 100 UNIT/ML IJ SOLN
0.0000 [IU] | Freq: Three times a day (TID) | INTRAMUSCULAR | Status: DC
  Administered 2022-03-09: 3 [IU] via SUBCUTANEOUS
  Filled 2022-03-09 (×2): qty 1

## 2022-03-09 MED ORDER — PROPOFOL 10 MG/ML IV BOLUS
INTRAVENOUS | Status: AC
  Filled 2022-03-09: qty 20

## 2022-03-09 MED ORDER — EPINEPHRINE PF 1 MG/ML IJ SOLN
INTRAMUSCULAR | Status: AC
  Filled 2022-03-09: qty 1

## 2022-03-09 MED ORDER — MUPIROCIN 2 % EX OINT
1.0000 | TOPICAL_OINTMENT | Freq: Two times a day (BID) | CUTANEOUS | Status: DC
  Administered 2022-03-10 – 2022-03-14 (×8): 1 via NASAL
  Filled 2022-03-09: qty 22

## 2022-03-09 MED ORDER — MORPHINE SULFATE (PF) 2 MG/ML IV SOLN: 2.0000 mg | INTRAVENOUS | Status: DC | PRN

## 2022-03-09 MED ORDER — ACETAMINOPHEN 500 MG PO TABS
500.0000 mg | ORAL_TABLET | Freq: Four times a day (QID) | ORAL | Status: AC
  Administered 2022-03-09 – 2022-03-10 (×2): 500 mg via ORAL
  Filled 2022-03-09 (×2): qty 1

## 2022-03-09 MED ORDER — DROPERIDOL 2.5 MG/ML IJ SOLN: 0.6250 mg | Freq: Once | INTRAMUSCULAR | Status: DC | PRN

## 2022-03-09 MED ORDER — HYDROCODONE-ACETAMINOPHEN 7.5-325 MG PO TABS: 1.0000 | ORAL_TABLET | ORAL | Status: DC | PRN

## 2022-03-09 MED ORDER — INSULIN ASPART 100 UNIT/ML IJ SOLN: 0.0000 [IU] | INTRAMUSCULAR | Status: DC

## 2022-03-09 MED ORDER — HYDROCODONE-ACETAMINOPHEN 5-325 MG PO TABS
1.0000 | ORAL_TABLET | Freq: Three times a day (TID) | ORAL | Status: DC | PRN
  Filled 2022-03-09: qty 1

## 2022-03-09 MED ORDER — SODIUM CHLORIDE FLUSH 0.9 % IV SOLN
INTRAVENOUS | Status: AC
  Filled 2022-03-09: qty 10

## 2022-03-09 MED ORDER — DOCUSATE SODIUM 100 MG PO CAPS
100.0000 mg | ORAL_CAPSULE | Freq: Two times a day (BID) | ORAL | Status: DC
  Administered 2022-03-09: 100 mg via ORAL
  Filled 2022-03-09: qty 1

## 2022-03-09 MED ORDER — PHENOL 1.4 % MT LIQD: 1.0000 | OROMUCOSAL | Status: DC | PRN

## 2022-03-09 MED ORDER — HYDRALAZINE HCL 50 MG PO TABS
50.0000 mg | ORAL_TABLET | Freq: Four times a day (QID) | ORAL | Status: DC
  Administered 2022-03-09: 50 mg via ORAL
  Filled 2022-03-09 (×3): qty 1

## 2022-03-09 MED ORDER — SODIUM CHLORIDE FLUSH 0.9 % IV SOLN
INTRAVENOUS | Status: AC
  Filled 2022-03-09: qty 20

## 2022-03-09 MED ORDER — OXYCODONE HCL 5 MG/5ML PO SOLN: 5.0000 mg | Freq: Once | ORAL | Status: DC | PRN

## 2022-03-09 MED ORDER — PROPOFOL 10 MG/ML IV BOLUS
INTRAVENOUS | Status: DC | PRN
  Administered 2022-03-09: 40 mg via INTRAVENOUS
  Administered 2022-03-09: 60 mg via INTRAVENOUS

## 2022-03-09 MED ORDER — METOCLOPRAMIDE HCL 5 MG/ML IJ SOLN: 5.0000 mg | Freq: Three times a day (TID) | INTRAMUSCULAR | Status: DC | PRN

## 2022-03-09 MED ORDER — METOCLOPRAMIDE HCL 5 MG PO TABS: 5.0000 mg | ORAL_TABLET | Freq: Three times a day (TID) | ORAL | Status: DC | PRN

## 2022-03-09 MED ORDER — PROMETHAZINE HCL 25 MG/ML IJ SOLN
INTRAMUSCULAR | Status: AC
  Administered 2022-03-09: 6.25 mg via INTRAVENOUS
  Filled 2022-03-09: qty 1

## 2022-03-09 MED ORDER — LEVOTHYROXINE SODIUM 50 MCG PO TABS
75.0000 ug | ORAL_TABLET | Freq: Every day | ORAL | Status: DC
  Administered 2022-03-09 – 2022-03-14 (×6): 75 ug via ORAL
  Filled 2022-03-09 (×6): qty 1

## 2022-03-09 MED ORDER — BUPIVACAINE HCL (PF) 0.25 % IJ SOLN
INTRAMUSCULAR | Status: AC
  Filled 2022-03-09: qty 30

## 2022-03-09 MED ORDER — FENTANYL CITRATE (PF) 100 MCG/2ML IJ SOLN
INTRAMUSCULAR | Status: AC
  Filled 2022-03-09: qty 2

## 2022-03-09 MED ORDER — METOPROLOL SUCCINATE ER 50 MG PO TB24
50.0000 mg | ORAL_TABLET | Freq: Every morning | ORAL | Status: DC
  Administered 2022-03-10 – 2022-03-14 (×4): 50 mg via ORAL
  Filled 2022-03-09 (×6): qty 1

## 2022-03-09 MED ORDER — FENTANYL CITRATE (PF) 100 MCG/2ML IJ SOLN
25.0000 ug | INTRAMUSCULAR | Status: DC | PRN
  Administered 2022-03-09 (×3): 25 ug via INTRAVENOUS

## 2022-03-09 MED ORDER — POLYVINYL ALCOHOL 1.4 % OP SOLN
1.0000 [drp] | OPHTHALMIC | Status: DC
  Administered 2022-03-09 – 2022-03-14 (×37): 1 [drp] via OPHTHALMIC
  Filled 2022-03-09: qty 15

## 2022-03-09 MED ORDER — ALBUTEROL SULFATE HFA 108 (90 BASE) MCG/ACT IN AERS: 2.0000 | INHALATION_SPRAY | Freq: Four times a day (QID) | RESPIRATORY_TRACT | Status: DC | PRN

## 2022-03-09 MED ORDER — LACTATED RINGERS IV SOLN: INTRAVENOUS | Status: DC

## 2022-03-09 MED ORDER — APIXABAN 2.5 MG PO TABS
2.5000 mg | ORAL_TABLET | Freq: Two times a day (BID) | ORAL | Status: DC
  Administered 2022-03-10 – 2022-03-14 (×9): 2.5 mg via ORAL
  Filled 2022-03-09 (×9): qty 1

## 2022-03-09 MED ORDER — PROMETHAZINE HCL 25 MG/ML IJ SOLN: 6.2500 mg | INTRAMUSCULAR | Status: DC | PRN

## 2022-03-09 MED ORDER — TRANEXAMIC ACID-NACL 1000-0.7 MG/100ML-% IV SOLN
1000.0000 mg | Freq: Once | INTRAVENOUS | Status: AC
  Administered 2022-03-09: 1000 mg via INTRAVENOUS

## 2022-03-09 MED ORDER — NEOMYCIN-POLYMYXIN-DEXAMETH 3.5-10000-0.1 OP OINT
1.0000 | TOPICAL_OINTMENT | Freq: Every day | OPHTHALMIC | Status: DC
  Administered 2022-03-09 – 2022-03-13 (×4): 1 via OPHTHALMIC
  Filled 2022-03-09 (×2): qty 3.5

## 2022-03-09 MED ORDER — SODIUM CHLORIDE 0.9 % IR SOLN
Status: DC | PRN
  Administered 2022-03-09: 500 mL

## 2022-03-09 MED ORDER — CEFAZOLIN SODIUM-DEXTROSE 2-4 GM/100ML-% IV SOLN
INTRAVENOUS | Status: AC
  Filled 2022-03-09: qty 100

## 2022-03-09 MED ORDER — ACETAMINOPHEN 325 MG PO TABS
325.0000 mg | ORAL_TABLET | Freq: Four times a day (QID) | ORAL | Status: DC | PRN
  Administered 2022-03-11: 650 mg via ORAL
  Filled 2022-03-09: qty 2

## 2022-03-09 MED ORDER — IRBESARTAN 150 MG PO TABS
150.0000 mg | ORAL_TABLET | Freq: Every day | ORAL | Status: DC
  Administered 2022-03-09: 150 mg via ORAL
  Filled 2022-03-09 (×2): qty 1

## 2022-03-09 MED ORDER — MENTHOL 3 MG MT LOZG: 1.0000 | LOZENGE | OROMUCOSAL | Status: DC | PRN

## 2022-03-09 MED ORDER — BUPIVACAINE HCL (PF) 0.25 % IJ SOLN
INTRAMUSCULAR | Status: DC | PRN
  Administered 2022-03-09: 30 mL

## 2022-03-09 MED ORDER — CEFAZOLIN SODIUM-DEXTROSE 2-4 GM/100ML-% IV SOLN
2.0000 g | Freq: Once | INTRAVENOUS | Status: AC
  Administered 2022-03-09: 2 g via INTRAVENOUS

## 2022-03-09 MED ORDER — FENTANYL CITRATE PF 50 MCG/ML IJ SOSY
12.5000 ug | PREFILLED_SYRINGE | INTRAMUSCULAR | Status: DC | PRN
  Administered 2022-03-09: 12.5 ug via INTRAVENOUS
  Filled 2022-03-09: qty 1

## 2022-03-09 MED ORDER — CHLORHEXIDINE GLUCONATE 0.12 % MT SOLN
OROMUCOSAL | Status: AC
  Administered 2022-03-09: 15 mL
  Filled 2022-03-09: qty 15

## 2022-03-09 SURGICAL SUPPLY — 40 items
BIT DRILL CALIBRATED 4.2 (BIT) IMPLANT
BIT DRILL CANN 16 HIP (BIT) IMPLANT
BIT DRILL TAPERED 10 (BIT) IMPLANT
BLADE TFNA HELICAL 100 NS (Anchor) IMPLANT
BNDG COHESIVE 4X5 TAN STRL LF (GAUZE/BANDAGES/DRESSINGS) ×1 IMPLANT
BNDG COHESIVE 6X5 TAN ST LF (GAUZE/BANDAGES/DRESSINGS) ×1 IMPLANT
CHLORAPREP W/TINT 26 (MISCELLANEOUS) ×1 IMPLANT
DERMABOND ADVANCED .7 DNX12 (GAUZE/BANDAGES/DRESSINGS) ×1 IMPLANT
DRAPE 3/4 80X56 (DRAPES) ×1 IMPLANT
DRAPE C-ARMOR (DRAPES) ×1 IMPLANT
DRILL BIT CALIBRATED 4.2 (BIT) ×1
DRSG MEPILEX SACRM 8.7X9.8 (GAUZE/BANDAGES/DRESSINGS) ×1 IMPLANT
DRSG OPSITE POSTOP 3X4 (GAUZE/BANDAGES/DRESSINGS) ×1 IMPLANT
DRSG OPSITE POSTOP 4X6 (GAUZE/BANDAGES/DRESSINGS) ×1 IMPLANT
ELECT CAUTERY BLADE 6.4 (BLADE) IMPLANT
ELECT REM PT RETURN 9FT ADLT (ELECTROSURGICAL) ×1
ELECTRODE REM PT RTRN 9FT ADLT (ELECTROSURGICAL) IMPLANT
GLOVE BIO SURGEON STRL SZ8 (GLOVE) ×1 IMPLANT
GLOVE PI ORTHO PRO STRL 7.5 (GLOVE) ×2 IMPLANT
GLOVE SURG SYN 7.5  E (GLOVE) ×1
GLOVE SURG SYN 7.5 E (GLOVE) ×1 IMPLANT
GLOVE SURG SYN 7.5 PF PI (GLOVE) ×1 IMPLANT
GOWN STRL REUS W/ TWL LRG LVL3 (GOWN DISPOSABLE) ×1 IMPLANT
GOWN STRL REUS W/ TWL XL LVL3 (GOWN DISPOSABLE) ×1 IMPLANT
GOWN STRL REUS W/TWL LRG LVL3 (GOWN DISPOSABLE) ×1
GOWN STRL REUS W/TWL XL LVL3 (GOWN DISPOSABLE) ×1
GUIDEWIRE 3.2X400 (WIRE) IMPLANT
MANIFOLD NEPTUNE II (INSTRUMENTS) ×1 IMPLANT
MAT ABSORB  FLUID 56X50 GRAY (MISCELLANEOUS) ×1
MAT ABSORB FLUID 56X50 GRAY (MISCELLANEOUS) ×1 IMPLANT
NAIL TROCH FIX 11X235 LT 130 (Nail) IMPLANT
NS IRRIG 500ML POUR BTL (IV SOLUTION) ×1 IMPLANT
PACK HIP COMPR (MISCELLANEOUS) ×1 IMPLANT
SCREW LOCK STAR 5X36 (Screw) IMPLANT
SLEEVE SCD COMPRESS KNEE MED (STOCKING) IMPLANT
SUT MNCRL AB 3-0 PS2 27 (SUTURE) IMPLANT
SYR 30ML LL (SYRINGE) ×1 IMPLANT
TAPE CLOTH 3X10 WHT NS LF (GAUZE/BANDAGES/DRESSINGS) ×1 IMPLANT
TRAP FLUID SMOKE EVACUATOR (MISCELLANEOUS) IMPLANT
WATER STERILE IRR 500ML POUR (IV SOLUTION) ×1 IMPLANT

## 2022-03-09 NOTE — Anesthesia Preprocedure Evaluation (Addendum)
Anesthesia Evaluation  Patient identified by MRN, date of birth, ID band Patient awake    Reviewed: Allergy & Precautions, NPO status , Patient's Chart, lab work & pertinent test results  Airway Mallampati: II  TM Distance: >3 FB Neck ROM: full    Dental  (+) Missing   Pulmonary asthma (Asthmatic bronchitis)    Pulmonary exam normal        Cardiovascular hypertension, pulmonary hypertension+CHF (HFrEF)  + dysrhythmias Atrial Fibrillation  Rhythm:Regular Rate:Normal     Neuro/Psych  PSYCHIATRIC DISORDERS     Dementia negative neurological ROS     GI/Hepatic negative GI ROS, Neg liver ROS,,,  Endo/Other  diabetes, Type 2Hypothyroidism    Renal/GU Renal InsufficiencyRenal disease     Musculoskeletal   Abdominal Normal abdominal exam  (+)   Peds  Hematology  (+) Blood dyscrasia, anemia   Anesthesia Other Findings Left intertrochanteric femur fracture after mechanical fall  Past Medical History: No date: Arrhythmia     Comment:  atrial fibrillation 1994: Breast cancer ([place])     Comment:  right breast No date: CHF (congestive heart failure) (HCC) No date: CKD (chronic kidney disease) No date: Diabetes mellitus without complication (HCC) No date: Hyperlipidemia No date: Hypertension No date: Pulmonary HTN ([place]) No date: Tachycardia  Past Surgical History: 1994: MASTECTOMY; Right  BMI    Body Mass Index: 38.45 kg/m      Reproductive/Obstetrics negative OB ROS                             Anesthesia Physical Anesthesia Plan  ASA: 3  Anesthesia Plan: General LMA   Post-op Pain Management: Ofirmev IV (intra-op)* and Regional block*   Induction: Intravenous  PONV Risk Score and Plan: 2 and Dexamethasone and Ondansetron  Airway Management Planned: LMA  Additional Equipment:   Intra-op Plan:   Post-operative Plan: Extubation in OR  Informed Consent: I have reviewed the  patients History and Physical, chart, labs and discussed the procedure including the risks, benefits and alternatives for the proposed anesthesia with the patient or authorized representative who has indicated his/her understanding and acceptance.   Patient has DNR.  Discussed DNR with patient and Suspend DNR.   Dental Advisory Given and Consent reviewed with POA  Plan Discussed with: Anesthesiologist, CRNA and Surgeon  Anesthesia Plan Comments:         Anesthesia Quick Evaluation

## 2022-03-09 NOTE — Anesthesia Procedure Notes (Addendum)
Procedure Name: LMA Insertion Date/Time: 03/09/2022 2:36 PM  Performed by: Nelda Marseille, CRNAPre-anesthesia Checklist: Patient identified, Patient being monitored, Timeout performed, Emergency Drugs available and Suction available Patient Re-evaluated:Patient Re-evaluated prior to induction Oxygen Delivery Method: Circle system utilized Preoxygenation: Pre-oxygenation with 100% oxygen Induction Type: IV induction Ventilation: Mask ventilation without difficulty LMA: LMA inserted LMA Size: 4.0 Tube type: Oral Number of attempts: 1 Placement Confirmation: positive ETCO2 and breath sounds checked- equal and bilateral Tube secured with: Tape Dental Injury: Teeth and Oropharynx as per pre-operative assessment

## 2022-03-09 NOTE — Interval H&P Note (Signed)
History and Physical Interval Note:  03/09/2022 1:35 PM  Destiny Oliver  has presented today for surgery, with the diagnosis of Left Hip Fracture.  The various methods of treatment have been discussed with the patient and family. After consideration of risks, benefits and other options for treatment, the patient has consented to  Procedure(s): INTRAMEDULLARY (IM) NAIL INTERTROCHANTERIC (Left) as a surgical intervention.  The patient's history has been reviewed, patient examined, no change in status, stable for surgery.  I have reviewed the patient's chart and labs.  Questions were answered to the patient's satisfaction.     Steffanie Rainwater

## 2022-03-09 NOTE — ED Notes (Signed)
Spoke to Reginold Agent, patient's POA and updated her that the plan was for her to have surgery today.

## 2022-03-09 NOTE — Progress Notes (Signed)
PROGRESS NOTE  Destiny Oliver    DOB: 07/16/1926, 88 y.o.  DXI:[id number]    Code Status: DNR   DOA: 03/09/2022   LOS: 0   Brief hospital course  Destiny Oliver is a 88 y.o. female with a PMH significant for diastolic CHF, hypertension, type 2 diabetes mellitus, hypothyroidism, CKD 4, paroxysmal A-fib on Eliquis.  They presented from ALF/ILF to the ED on 03/09/2022 with fall with immediate onset of left hip pain.  Patient had no loss of consciousness.  Reports that she lost her balance falling onto her buttock.  She was previously in her usual state of health.   In the ED, it was found that they had Mild tachycardia of 102 with otherwise normal vitals.  Creatinine at baseline at 1.87 and hemoglobin 11.5.  Labs otherwise unremarkable.  Troponin normal at 15. .  Significant findings included Chest x-ray nonacute and hip x-ray showing left intertrochanteric femur fracture.  They were initially treated with analgesia. Orthosurgery was consulted   Patient was admitted to medicine service for further workup and management of comorbidities as outlined in detail below. 11/8- fixation  03/09/22 -stable, working with PT/OT  Assessment & Plan  Principal Problem:   Intertrochanteric fracture of left femur, closed, initial encounter [facility]) Active Problems:   Paroxysmal atrial fibrillation (HCC)   CKD (chronic kidney disease) stage 4, GFR 15-29 ml/min (HCC)   Chronic diastolic CHF (congestive heart failure) (HCC)   Type 2 diabetes mellitus (HCC)   Hypothyroidism   History of anemia due to CKD   Dementia without behavioral disturbance (HCC)  Intertrochanteric fracture of left femur, closed, initial encounter ([place]) - ortho managing, appreciate your care - analgesia PRN - PT/OT for disposition planning, TOC engaged   Dementia without behavioral disturbance- at baseline Delirium precautions   History of anemia due to CKD and acute blood loss- hgb 11.9>9.5 - monitor and transfuse as  necessary - restart eliquis when stable    Hypothyroidism Continue levothyroxine   Chronic diastolic CHF (congestive heart failure) (HCC) Essential hypertension Stable to low Bps this am Discontinue telmisartan as well as hydralazine Continue metoprolol Hold furosemide   Paroxysmal atrial fibrillation (HCC) Continue metoprolol Hold Eliquis for anemia and can restart tomorrow if stable hgb  Body mass index is 38.45 kg/m.  VTE ppx: SCDs Start: 03/09/22 0331   Diet:     Diet   Diet NPO time specified   Consultants: Ortho surgery Subjective 03/09/22    Pt reports doing well today. No pain at rest. Took a couple steps with PT. Friend at bedside. All questions and concerns addressed at time of encounter   Objective   Vitals:   03/09/22 0400 03/09/22 0401 03/09/22 0500 03/09/22 0617  BP: (!) 110/59  (!) 89/50 115/71  Pulse: (!) 101 95 83 94  Resp: '17 14 16 15  '$ Temp: 98.4 F (36.9 C)     TempSrc: Oral     SpO2: 95% 95% 97% 96%  Weight:       No intake or output data in the 24 hours ending 03/09/22 0818 Filed Weights   03/09/22 0023  Weight: 89.3 kg     Physical Exam:  General: awake, alert, NAD HEENT: atraumatic, clear conjunctiva, anicteric sclera, MMM, hard of hearing Respiratory: normal respiratory effort. Cardiovascular: quick capillary refill Nervous: A&O x3. no gross focal neurologic deficits, normal speech Extremities: moves all equally, no edema, normal tone Skin: dry, intact, normal temperature, normal color. No rashes, lesions or ulcers on exposed  skin Psychiatry: normal mood, congruent affect  Labs   I have personally reviewed the following labs and imaging studies CBC    Component Value Date/Time   WBC 7.3 03/09/2022 0026   RBC 4.26 03/09/2022 0026   HGB 11.9 (L) 03/09/2022 0026   HGB 12.3 06/02/2012 1812   HCT 38.1 03/09/2022 0026   HCT 37.7 06/02/2012 1812   PLT 193 03/09/2022 0026   PLT 244 06/02/2012 1812   MCV 89.4 03/09/2022 0026    MCV 88 06/02/2012 1812   MCH 27.9 03/09/2022 0026   MCHC 31.2 03/09/2022 0026   RDW 14.5 03/09/2022 0026   RDW 15.0 (H) 06/02/2012 1812   LYMPHSABS 1.0 03/19/2020 1450   MONOABS 0.6 03/19/2020 1450   EOSABS 0.1 03/19/2020 1450   BASOSABS 0.0 03/19/2020 1450      Latest Ref Rng & Units 03/09/2022   12:26 AM 03/31/2021   11:05 AM 03/19/2020    2:50 PM  BMP  Glucose 70 - 99 mg/dL 150  113  158   BUN 8 - 23 mg/dL 36  26  33   Creatinine 0.44 - 1.00 mg/dL 1.87  1.57  2.11   Sodium 135 - 145 mmol/L 142  138  136   Potassium 3.5 - 5.1 mmol/L 3.7  3.9  4.1   Chloride 98 - 111 mmol/L 111  109  106   CO2 22 - 32 mmol/L '21  23  20   '$ Calcium 8.9 - 10.3 mg/dL 9.4  9.1  8.7     DG FEMUR MIN 2 VIEWS LEFT  Result Date: 03/09/2022 CLINICAL DATA:  Left hip fracture EXAM: LEFT FEMUR 2 VIEWS COMPARISON:  Left hip radiographs dated 03/09/2022 at 0104 hours FINDINGS: Intertrochanteric left hip fracture. Mild varus angulation. Displaced lesser trochanteric fragment. Left knee arthroplasty, without complication. IMPRESSION: Intertrochanteric left hip fracture, as above. Electronically Signed   By: Julian Hy M.D.   On: 03/09/2022 03:35   DG Chest 1 View  Result Date: 03/09/2022 CLINICAL DATA:  Preop EXAM: CHEST  1 VIEW COMPARISON:  03/31/2021 FINDINGS: Increased interstitial markings. No focal consolidation. No pleural effusion or pneumothorax. Mild cardiomegaly. Surgical clips along the right chest wall/axilla. IMPRESSION: No evidence of acute cardiopulmonary disease. Mild cardiomegaly. Electronically Signed   By: Julian Hy M.D.   On: 03/09/2022 01:40   DG Hip Unilat With Pelvis 2-3 Views Left  Result Date: 03/09/2022 CLINICAL DATA:  Left hip pain after fall EXAM: DG HIP (WITH OR WITHOUT PELVIS) 2-3V LEFT COMPARISON:  Radiographs 04/18/2018 FINDINGS: Acute comminuted displaced left intertrochanteric fracture. Lateral displacement of the distal femoral shaft and medial displacement of the  lesser trochanter. The left femoral head remains located in the acetabulum. No additional fractures. IMPRESSION: Left intertrochanteric femur fracture. Electronically Signed   By: Placido Sou M.D.   On: 03/09/2022 01:40    Disposition Plan & Communication  Patient status: Inpatient  Admitted From: ALF Planned disposition location: Skilled nursing facility Anticipated discharge date: 11/13 pending placement  Family Communication: friend at bedside    Author: Richarda Osmond, DO [facility] 03/09/2022, 8:18 AM   Available by Epic secure chat 7AM-7PM. If 7PM-7AM, please contact night-coverage.  [facility] contact information found on [email or web address].

## 2022-03-09 NOTE — TOC Initial Note (Signed)
Transition of Care [facility]) - Initial/Assessment Note    Patient Details  Name: Destiny Oliver MRN: [id number] Date of Birth: [birth year]  [facility]) CM/SW Contact:    Shelbie Hutching, RN Phone Number: 03/09/2022, 11:52 AM  Clinical Narrative:                 Patient is from [facility], fell this morning walking to the bathroom.  Patient has a left hip fracture. Open with [facility].  Patient may likely need SNF at discharge.   Expected Discharge Plan: [place] Barriers to Discharge: Continued Medical Work up   Patient Goals and CMS Choice        Expected Discharge Plan and Services Expected Discharge Plan: [place]   Discharge Planning Services: CM Consult   Living arrangements for the past 2 months: [place]                                      Prior Living Arrangements/Services Living arrangements for the past 2 months: [place] Lives with:: Facility Resident                   Activities of Daily Living      Permission Sought/Granted                  Emotional Assessment              Admission diagnosis:  Intertrochanteric fracture of left femur, closed, initial encounter [facility]) [X78.478S] Patient Active Problem List   Diagnosis Date Noted   Intertrochanteric fracture of left femur, closed, initial encounter ([place]) 03/09/2022   Type 2 diabetes mellitus ([place]) 03/09/2022   Hypothyroidism 03/09/2022   History of anemia due to CKD 03/09/2022   Dementia without behavioral disturbance ([place]) 03/09/2022   Chronic diastolic CHF (congestive heart failure) ([place]) 10/02/2019   HCAP (healthcare-associated pneumonia) 05/23/2018   UTI due to extended-spectrum beta lactamase (ESBL) producing Escherichia coli 05/15/2018   Diplopia 05/08/2018   Vertigo 05/08/2018   Type 2 diabetes mellitus with stage 4 chronic kidney disease and hypertension ([place]) 05/02/2018    CKD (chronic kidney disease) stage 4, GFR 15-29 ml/min ([place]) 05/02/2018   Hypertensive heart and kidney disease with chronic diastolic congestive heart failure and stage 4 chronic kidney disease ([place]) 05/02/2018   Acute on chronic diastolic heart failure ([place]) 05/02/2018   Dyslipidemia associated with type 2 diabetes mellitus ([place]) 05/02/2018   Anemia associated with stage 4 chronic renal failure ([place]) 05/02/2018   Acute on chronic respiratory failure with hypoxia ([place]) 05/02/2018   Rhabdomyolysis 04/29/2018   Paroxysmal atrial fibrillation ([place]) 04/18/2018   PCP:  System, Provider Not In Pharmacy:   [place], [address]. [place] [address] Phone: [phone] Fax: [phone]     Social Determinants of Health (SDOH) Interventions    Readmission Risk Interventions     No data to display

## 2022-03-09 NOTE — Assessment & Plan Note (Signed)
Delirium precautions 

## 2022-03-09 NOTE — Assessment & Plan Note (Signed)
Essential hypertension Stable Continue metoprolol and telmisartan as well as hydralazine Hold furosemide

## 2022-03-09 NOTE — Assessment & Plan Note (Signed)
Pain control Keep n.p.o. Consult Ortho Hold Eliquis

## 2022-03-09 NOTE — Consult Note (Addendum)
ORTHOPAEDIC CONSULTATION  REQUESTING PHYSICIAN: Richarda Osmond, MD  Chief Complaint:   Left intertrochanteric femur fracture  History of Present Illness: Destiny Oliver is a 88 y.o. female with medical history significant for diastolic CHF, hypertension, type 2 diabetes mellitus, hypothyroidism, CKD 4, paroxysmal A-fib on Eliquis, who presented to the emergency room from an outpatient memory facility following a fall with left hip pain and found to have a left intertrochanteric femur fracture.  Patient denies any loss of consciousness and reports she lost her balance leading to her fall.  She denies any recent illnesses or any head trauma.  She denies any cough, fever, chills, chest pain, vomiting, numbness or abdominal pain.  She denies pain anywhere else on her body at this time. At this time she was alert and oriented to person, place, time, year, and president.   Past Medical History:  Diagnosis Date   Arrhythmia    atrial fibrillation   Breast cancer ([place]) 1994   right breast   CHF (congestive heart failure) (HCC)    CKD (chronic kidney disease)    Diabetes mellitus without complication (HCC)    Hyperlipidemia    Hypertension    Pulmonary HTN (HCC)    Tachycardia    Past Surgical History:  Procedure Laterality Date   MASTECTOMY Right 1994   Social History   Socioeconomic History   Marital status: Married    Spouse name: Not on file   Number of children: Not on file   Years of education: Not on file   Highest education level: Not on file  Occupational History   Not on file  Tobacco Use   Smoking status: Never   Smokeless tobacco: Never  Substance and Sexual Activity   Alcohol use: Never   Drug use: Never   Sexual activity: Not on file  Other Topics Concern   Not on file  Social History Narrative   Not on file   Social Determinants of Health   Financial Resource Strain: Not on file  Food  Insecurity: Not on file  Transportation Needs: Not on file  Physical Activity: Not on file  Stress: Not on file  Social Connections: Not on file   No family history on file. Allergies  Allergen Reactions   Penicillins Swelling   Prior to Admission medications   Medication Sig Start Date End Date Taking? Authorizing Provider  apixaban (ELIQUIS) 2.5 MG TABS tablet Take 1 tablet (2.5 mg total) by mouth 2 (two) times daily. 04/30/18  Yes Demetrios Loll, MD  Emollient (CERAVE) CREA Apply 1 Application topically as directed. (Apply after washing)   Yes [provider]  allopurinol (ZYLOPRIM) 100 MG tablet Take 100 mg by mouth daily. 09/12/19   [provider]  amiodarone (PACERONE) 200 MG tablet Take 1 tablet (200 mg total) by mouth daily. 04/30/18   Demetrios Loll, MD  cholecalciferol (VITAMIN D3) 25 MCG (1000 UT) tablet Take 1,000 Units by mouth daily.    [provider]  colchicine 0.6 MG tablet  05/20/19   [provider]  cyanocobalamin ([employer] VITAMIN B12) 1000 MCG tablet Take 1,000 mcg by mouth daily.     [provider]  docusate sodium (COLACE) 100 MG capsule Take 100 mg by mouth daily.    [provider]  hydrALAZINE (APRESOLINE) 25 MG tablet Take 25 mg by mouth 4 (four) times daily.    [provider]  Lactobacillus TABS Take 1 tablet by mouth in the morning, at noon, and at  bedtime.     [provider]  levothyroxine (SYNTHROID) 75 MCG tablet Take 75 mcg by mouth daily. 10/02/19   [provider]  Multiple Vitamins-Minerals (HAIR SKIN AND NAILS FORMULA) TABS Take 1 tablet by mouth daily.     [provider]  Nutritional Supplements (ENSURE ENLIVE PO) Give by mouth two times daily between meals  04/30/18   [provider]  OXYGEN Inhale 2 L/min into the lungs continuous. 04/30/18   [provider]  polyethylene glycol powder (GLYCOLAX/MIRALAX) 17 GM/SCOOP powder Take by mouth.    [provider]  potassium chloride SA (KLOR-CON) 20 MEQ tablet Take 20 mEq by mouth daily. 09/12/19   [provider]  pravastatin (PRAVACHOL) 40 MG tablet Take 40 mg by mouth every other day. 02/08/15   [provider]  telmisartan (MICARDIS) 40 MG tablet Take 40 mg by mouth daily.    [provider]   DG FEMUR MIN 2 VIEWS LEFT  Result Date: 03/09/2022 CLINICAL DATA:  Left hip fracture EXAM: LEFT FEMUR 2 VIEWS COMPARISON:  Left hip radiographs dated 03/09/2022 at 0104 hours FINDINGS: Intertrochanteric left hip fracture. Mild varus angulation. Displaced lesser trochanteric fragment. Left knee arthroplasty, without complication. IMPRESSION: Intertrochanteric left hip fracture, as above. Electronically Signed   By: Julian Hy M.D.   On: 03/09/2022 03:35   DG Chest 1 View  Result Date: 03/09/2022 CLINICAL DATA:  Preop EXAM: CHEST  1 VIEW COMPARISON:  03/31/2021 FINDINGS: Increased interstitial markings. No focal consolidation. No pleural effusion or pneumothorax. Mild cardiomegaly. Surgical clips along the right chest wall/axilla. IMPRESSION: No evidence of acute cardiopulmonary disease. Mild cardiomegaly. Electronically Signed   By: Julian Hy M.D.   On: 03/09/2022 01:40   DG Hip Unilat With Pelvis 2-3 Views Left  Result Date: 03/09/2022 CLINICAL DATA:  Left hip pain after fall EXAM: DG HIP (WITH OR WITHOUT PELVIS) 2-3V LEFT COMPARISON:  Radiographs 04/18/2018 FINDINGS: Acute comminuted displaced left intertrochanteric fracture. Lateral displacement of the distal femoral shaft and medial displacement of the lesser trochanter. The left femoral head remains located in the acetabulum. No additional fractures. IMPRESSION: Left intertrochanteric femur fracture. Electronically Signed   By: Placido Sou M.D.   On: 03/09/2022 01:40    Positive ROS: All other systems have been reviewed and were otherwise negative with the exception of those mentioned in the HPI and as  above.  Physical Exam: General:  Alert, no acute distress Psychiatric:  Patient is competent for consent and alert and oriented at this time  Cardiovascular:  No pedal edema Respiratory:  No wheezing, non-labored breathing GI:  Abdomen is soft and non-tender Skin:  No lesions in the area of chief complaint Neurologic:  Sensation intact distally Lymphatic:  No axillary or cervical lymphadenopathy  Orthopedic Exam:  Left lower extremity shortened and externally rotated Skin intact over the hip lower leg ankle and foot Mild swelling over the lateral hip Tender to palpation over lateral hip and tender with any motion of the leg or logroll Sensation intact to the foot and entire leg Good dorsal and posterior tibialis pulse on the left foot Able to dorsiflex and plantarflex the left foot and big toe.  Secondary survey No pain to palpation of any other bony prominences around the body good motion of the right leg and both arms without pain no tenderness palpation over the neck or upper back.   X-rays:  X-ray reads above acute left intertrochanteric femur fracture not extending significantly below the  lesser trochanter.   Assessment: Left intertrochanteric femur fracture  Plan: The patient has an acute left intertrochanteric femur fracture after a fall I had a long discussion with the patient about the need for surgical fixation and the option for nonsurgical fixation.  Under shared decision making we have elected to move forward with a open reduction internal fixation with intramedullary nail fixation of the left femur and hip.  I discussed the risks and benefits with the patient including but not limited to infection, blood clot, stroke, death, malunion, nonunion, need for additional surgeries in the future, hardware failure, hardware breakage, pulmonary embolism.  The patient is currently on Eliquis 5 mg twice daily secondary to her chronic kidney disease it would take 4 to 5 days for her  system to fully evacuate the Eliquis.  We will proceed with surgery at this time as she has a very stable H&H and will be prepared with blood on hold and will use tranexamic acid during surgery to help reduce blood loss.  I discussed the increased risk of bleeding with the patient due to the Eliquis and she elected to move forward with surgery as soon as we can.  All questions were answered the patient agrees with the above plan for surgical fixation of her left hip fracture.  Plan for OR today, maintain NPO status, hold any anticoagulation.   Patient seen and examined again in preop holding with patient's power of attorney (her friend) at bedside, I reviewed risks, benefits, and alternatives of surgery and signed consent, also received verbal consent from the power of attorney for the surgery.   Steffanie Rainwater MD  Beeper #:  [phone]  03/09/2022 10:16 AM

## 2022-03-09 NOTE — Assessment & Plan Note (Signed)
Continue levothyroxine 

## 2022-03-09 NOTE — H&P (View-Only) (Signed)
ORTHOPAEDIC CONSULTATION  REQUESTING PHYSICIAN: Richarda Osmond, MD  Chief Complaint:   Left intertrochanteric femur fracture  History of Present Illness: Destiny Oliver is a 88 y.o. female with medical history significant for diastolic CHF, hypertension, type 2 diabetes mellitus, hypothyroidism, CKD 4, paroxysmal A-fib on Eliquis, who presented to the emergency room from an outpatient memory facility following a fall with left hip pain and found to have a left intertrochanteric femur fracture.  Patient denies any loss of consciousness and reports she lost her balance leading to her fall.  She denies any recent illnesses or any head trauma.  She denies any cough, fever, chills, chest pain, vomiting, numbness or abdominal pain.  She denies pain anywhere else on her body at this time. At this time she was alert and oriented to person, place, time, year, and president.   Past Medical History:  Diagnosis Date   Arrhythmia    atrial fibrillation   Breast cancer ([place]) 1994   right breast   CHF (congestive heart failure) (HCC)    CKD (chronic kidney disease)    Diabetes mellitus without complication (HCC)    Hyperlipidemia    Hypertension    Pulmonary HTN (HCC)    Tachycardia    Past Surgical History:  Procedure Laterality Date   MASTECTOMY Right 1994   Social History   Socioeconomic History   Marital status: Married    Spouse name: Not on file   Number of children: Not on file   Years of education: Not on file   Highest education level: Not on file  Occupational History   Not on file  Tobacco Use   Smoking status: Never   Smokeless tobacco: Never  Substance and Sexual Activity   Alcohol use: Never   Drug use: Never   Sexual activity: Not on file  Other Topics Concern   Not on file  Social History Narrative   Not on file   Social Determinants of Health   Financial Resource Strain: Not on file  Food  Insecurity: Not on file  Transportation Needs: Not on file  Physical Activity: Not on file  Stress: Not on file  Social Connections: Not on file   No family history on file. Allergies  Allergen Reactions   Penicillins Swelling   Prior to Admission medications   Medication Sig Start Date End Date Taking? Authorizing Provider  apixaban (ELIQUIS) 2.5 MG TABS tablet Take 1 tablet (2.5 mg total) by mouth 2 (two) times daily. 04/30/18  Yes Demetrios Loll, MD  Emollient (CERAVE) CREA Apply 1 Application topically as directed. (Apply after washing)   Yes [provider]  allopurinol (ZYLOPRIM) 100 MG tablet Take 100 mg by mouth daily. 09/12/19   [provider]  amiodarone (PACERONE) 200 MG tablet Take 1 tablet (200 mg total) by mouth daily. 04/30/18   Demetrios Loll, MD  cholecalciferol (VITAMIN D3) 25 MCG (1000 UT) tablet Take 1,000 Units by mouth daily.    [provider]  colchicine 0.6 MG tablet  05/20/19   [provider]  cyanocobalamin ([employer] VITAMIN B12) 1000 MCG tablet Take 1,000 mcg by mouth daily.     [provider]  docusate sodium (COLACE) 100 MG capsule Take 100 mg by mouth daily.    [provider]  hydrALAZINE (APRESOLINE) 25 MG tablet Take 25 mg by mouth 4 (four) times daily.    [provider]  Lactobacillus TABS Take 1 tablet by mouth in the morning, at noon, and at  bedtime.     [provider]  levothyroxine (SYNTHROID) 75 MCG tablet Take 75 mcg by mouth daily. 10/02/19   [provider]  Multiple Vitamins-Minerals (HAIR SKIN AND NAILS FORMULA) TABS Take 1 tablet by mouth daily.     [provider]  Nutritional Supplements (ENSURE ENLIVE PO) Give by mouth two times daily between meals  04/30/18   [provider]  OXYGEN Inhale 2 L/min into the lungs continuous. 04/30/18   [provider]  polyethylene glycol powder (GLYCOLAX/MIRALAX) 17 GM/SCOOP powder Take by mouth.    [provider]  potassium chloride SA (KLOR-CON) 20 MEQ tablet Take 20 mEq by mouth daily. 09/12/19   [provider]  pravastatin (PRAVACHOL) 40 MG tablet Take 40 mg by mouth every other day. 02/08/15   [provider]  telmisartan (MICARDIS) 40 MG tablet Take 40 mg by mouth daily.    [provider]   DG FEMUR MIN 2 VIEWS LEFT  Result Date: 03/09/2022 CLINICAL DATA:  Left hip fracture EXAM: LEFT FEMUR 2 VIEWS COMPARISON:  Left hip radiographs dated 03/09/2022 at 0104 hours FINDINGS: Intertrochanteric left hip fracture. Mild varus angulation. Displaced lesser trochanteric fragment. Left knee arthroplasty, without complication. IMPRESSION: Intertrochanteric left hip fracture, as above. Electronically Signed   By: Julian Hy M.D.   On: 03/09/2022 03:35   DG Chest 1 View  Result Date: 03/09/2022 CLINICAL DATA:  Preop EXAM: CHEST  1 VIEW COMPARISON:  03/31/2021 FINDINGS: Increased interstitial markings. No focal consolidation. No pleural effusion or pneumothorax. Mild cardiomegaly. Surgical clips along the right chest wall/axilla. IMPRESSION: No evidence of acute cardiopulmonary disease. Mild cardiomegaly. Electronically Signed   By: Julian Hy M.D.   On: 03/09/2022 01:40   DG Hip Unilat With Pelvis 2-3 Views Left  Result Date: 03/09/2022 CLINICAL DATA:  Left hip pain after fall EXAM: DG HIP (WITH OR WITHOUT PELVIS) 2-3V LEFT COMPARISON:  Radiographs 04/18/2018 FINDINGS: Acute comminuted displaced left intertrochanteric fracture. Lateral displacement of the distal femoral shaft and medial displacement of the lesser trochanter. The left femoral head remains located in the acetabulum. No additional fractures. IMPRESSION: Left intertrochanteric femur fracture. Electronically Signed   By: Placido Sou M.D.   On: 03/09/2022 01:40    Positive ROS: All other systems have been reviewed and were otherwise negative with the exception of those mentioned in the HPI and as  above.  Physical Exam: General:  Alert, no acute distress Psychiatric:  Patient is competent for consent and alert and oriented at this time  Cardiovascular:  No pedal edema Respiratory:  No wheezing, non-labored breathing GI:  Abdomen is soft and non-tender Skin:  No lesions in the area of chief complaint Neurologic:  Sensation intact distally Lymphatic:  No axillary or cervical lymphadenopathy  Orthopedic Exam:  Left lower extremity shortened and externally rotated Skin intact over the hip lower leg ankle and foot Mild swelling over the lateral hip Tender to palpation over lateral hip and tender with any motion of the leg or logroll Sensation intact to the foot and entire leg Good dorsal and posterior tibialis pulse on the left foot Able to dorsiflex and plantarflex the left foot and big toe.  Secondary survey No pain to palpation of any other bony prominences around the body good motion of the right leg and both arms without pain no tenderness palpation over the neck or upper back.   X-rays:  X-ray reads above acute left intertrochanteric femur fracture not extending significantly below the  lesser trochanter.   Assessment: Left intertrochanteric femur fracture  Plan: The patient has an acute left intertrochanteric femur fracture after a fall I had a long discussion with the patient about the need for surgical fixation and the option for nonsurgical fixation.  Under shared decision making we have elected to move forward with a open reduction internal fixation with intramedullary nail fixation of the left femur and hip.  I discussed the risks and benefits with the patient including but not limited to infection, blood clot, stroke, death, malunion, nonunion, need for additional surgeries in the future, hardware failure, hardware breakage, pulmonary embolism.  The patient is currently on Eliquis 5 mg twice daily secondary to her chronic kidney disease it would take 4 to 5 days for her  system to fully evacuate the Eliquis.  We will proceed with surgery at this time as she has a very stable H&H and will be prepared with blood on hold and will use tranexamic acid during surgery to help reduce blood loss.  I discussed the increased risk of bleeding with the patient due to the Eliquis and she elected to move forward with surgery as soon as we can.  All questions were answered the patient agrees with the above plan for surgical fixation of her left hip fracture.  Plan for OR today, maintain NPO status, hold any anticoagulation.    Steffanie Rainwater MD  Beeper #:  [phone]  03/09/2022 10:16 AM

## 2022-03-09 NOTE — ED Provider Notes (Signed)
[facility] Provider Note    None    (approximate)   History   Fall (Patient fell while ambulating to the bathroom. Left lower extremity is shortened and outwardly rotated)   HPI  Destiny Oliver is a 88 y.o. female  brought to the ED from dementia unit with a chief complaint of mechanical fall with left hip pain. Patient does take Eliquis for atrial fibrillation. Denies striking head or LOC. States she lost her balance and fell onto her buttocks and left hip. Denies vision changes, neck pain, chest pain, shortness of breath, abdominal pain, nausea, vomiting or dizziness.       Past Medical History   Past Medical History:  Diagnosis Date   Arrhythmia    atrial fibrillation   Breast cancer ([place]) 1994   right breast   CHF (congestive heart failure) ([place])    CKD (chronic kidney disease)    Diabetes mellitus without complication ([place])    Hyperlipidemia    Hypertension    Pulmonary HTN ([place])    Tachycardia      Active Problem List   Patient Active Problem List   Diagnosis Date Noted   Acute on chronic diastolic CHF (congestive heart failure) ([place]) 10/02/2019   HCAP (healthcare-associated pneumonia) 05/23/2018   UTI due to extended-spectrum beta lactamase (ESBL) producing Escherichia coli 05/15/2018   Diplopia 05/08/2018   Vertigo 05/08/2018   Type 2 diabetes mellitus with stage 4 chronic kidney disease and hypertension ([place]) 05/02/2018   Stage 4 chronic renal impairment associated with type 2 diabetes mellitus ([place]) 05/02/2018   Hypertensive heart and kidney disease with chronic diastolic congestive heart failure and stage 4 chronic kidney disease ([place]) 05/02/2018   Acute on chronic diastolic heart failure ([place]) 05/02/2018   Dyslipidemia associated with type 2 diabetes mellitus ([place]) 05/02/2018   Anemia associated with stage 4 chronic renal failure ([place]) 05/02/2018   Acute on chronic respiratory failure with hypoxia ([place]) 05/02/2018    Rhabdomyolysis 04/29/2018   A-fib ([place]) 04/18/2018     Past Surgical History   Past Surgical History:  Procedure Laterality Date   MASTECTOMY Right 1994     Home Medications   Prior to Admission medications   Medication Sig Start Date End Date Taking? Authorizing Provider  allopurinol (ZYLOPRIM) 100 MG tablet Take 100 mg by mouth daily. 09/12/19   [provider]  amiodarone (PACERONE) 200 MG tablet Take 1 tablet (200 mg total) by mouth daily. 04/30/18   Demetrios Loll, MD  apixaban (ELIQUIS) 2.5 MG TABS tablet Take 1 tablet (2.5 mg total) by mouth 2 (two) times daily. 04/30/18   Demetrios Loll, MD  cholecalciferol (VITAMIN D3) 25 MCG (1000 UT) tablet Take 1,000 Units by mouth daily.    [provider]  colchicine 0.6 MG tablet  05/20/19   [provider]  cyanocobalamin ([employer] VITAMIN B12) 1000 MCG tablet Take 1,000 mcg by mouth daily.     [provider]  docusate sodium (COLACE) 100 MG capsule Take 100 mg by mouth daily.    [provider]  furosemide (LASIX) 40 MG tablet Take 1 tablet (40 mg total) by mouth daily. Patient taking differently: Take 40 mg by mouth daily. And 1/2 tablet in PM 10/06/19 01/13/20  Wyvonnia Dusky, MD  hydrALAZINE (APRESOLINE) 25 MG tablet Take 25 mg by mouth 4 (four) times daily.    [provider]  Lactobacillus TABS Take 1 tablet by mouth in the morning, at noon, and at  bedtime.     [provider]  levothyroxine (SYNTHROID) 75 MCG tablet Take 75 mcg by mouth daily. 10/02/19   [provider]  metoprolol succinate (TOPROL-XL) 50 MG 24 hr tablet Take 1 tablet (50 mg total) by mouth daily. Take with or immediately following a meal. 10/07/19 01/13/20  Wyvonnia Dusky, MD  Multiple Vitamins-Minerals (HAIR SKIN AND NAILS FORMULA) TABS Take 1 tablet by mouth daily.     [provider]  Nutritional Supplements (ENSURE ENLIVE PO) Give by mouth two times daily between meals  04/30/18    [provider]  OXYGEN Inhale 2 L/min into the lungs continuous. 04/30/18   [provider]  polyethylene glycol powder (GLYCOLAX/MIRALAX) 17 GM/SCOOP powder Take by mouth.    [provider]  potassium chloride SA (KLOR-CON) 20 MEQ tablet Take 20 mEq by mouth daily. 09/12/19   [provider]  pravastatin (PRAVACHOL) 40 MG tablet Take 40 mg by mouth every other day. 02/08/15   [provider]  telmisartan (MICARDIS) 40 MG tablet Take 40 mg by mouth daily.    [provider]     Allergies  Penicillins   Family History  No family history on file.   Physical Exam  Triage Vital Signs: ED Triage Vitals  Enc Vitals Group     BP      Pulse      Resp      Temp      Temp src      SpO2      Weight      Height      Head Circumference      Peak Flow      Pain Score      Pain Loc      Pain Edu?      Excl. in [place]?     Updated Vital Signs: BP 124/75   Pulse 68   Temp 98.5 F (36.9 C) (Oral)   Resp 16   Wt 89.3 kg   SpO2 95%   BMI 38.45 kg/m    General: Awake, mild distress.  CV:  RRR. Good peripheral perfusion.  Resp:  Normal effort. CTAB. Abd:  Nontender. No distention.  Other:  Pelvis stable. LLE shortened with external rotation. Left hip tender to palpation; limited ROM secondary to pain. 2+ femoral and distal pulses. Brisk, less than 5 second capillary refill.   ED Results / Procedures / Treatments  Labs (all labs ordered are listed, but only abnormal results are displayed) Labs Reviewed  CBC - Abnormal; Notable for the following components:      Result Value   Hemoglobin 11.9 (*)    All other components within normal limits  COMPREHENSIVE METABOLIC PANEL - Abnormal; Notable for the following components:   CO2 21 (*)    Glucose, Bld 150 (*)    BUN 36 (*)    Creatinine, Ser 1.87 (*)    GFR, Estimated 25 (*)    All other components within normal limits  PROTIME-INR  TYPE AND SCREEN  TYPE AND SCREEN   TROPONIN I (HIGH SENSITIVITY)  TROPONIN I (HIGH SENSITIVITY)     EKG  ED ECG REPORT I, Aarnav Steagall J, the attending physician, personally viewed and interpreted this ECG.   Date: 03/09/2022  EKG Time: 0037  Rate: 98  Rhythm: atrial fibrillation, rate 98  Axis: Normal  Intervals:none  ST&T Change: Nonspecific   RADIOLOGY I have independently visualized and interpreted patient's xrays as well as noted the  radiology interpretation:  Left hip xray: Left IT fx  CXR: Mild cardiomegaly  Official radiology report(s): DG Chest 1 View  Result Date: 03/09/2022 CLINICAL DATA:  Preop EXAM: CHEST  1 VIEW COMPARISON:  03/31/2021 FINDINGS: Increased interstitial markings. No focal consolidation. No pleural effusion or pneumothorax. Mild cardiomegaly. Surgical clips along the right chest wall/axilla. IMPRESSION: No evidence of acute cardiopulmonary disease. Mild cardiomegaly. Electronically Signed   By: Julian Hy M.D.   On: 03/09/2022 01:40   DG Hip Unilat With Pelvis 2-3 Views Left  Result Date: 03/09/2022 CLINICAL DATA:  Left hip pain after fall EXAM: DG HIP (WITH OR WITHOUT PELVIS) 2-3V LEFT COMPARISON:  Radiographs 04/18/2018 FINDINGS: Acute comminuted displaced left intertrochanteric fracture. Lateral displacement of the distal femoral shaft and medial displacement of the lesser trochanter. The left femoral head remains located in the acetabulum. No additional fractures. IMPRESSION: Left intertrochanteric femur fracture. Electronically Signed   By: Placido Sou M.D.   On: 03/09/2022 01:40     PROCEDURES:  Critical Care performed: Yes, see critical care procedure note(s)  CRITICAL CARE Performed by: Paulette Blanch   Total critical care time: 30 minutes  Critical care time was exclusive of separately billable procedures and treating other patients.  Critical care was necessary to treat or prevent imminent or life-threatening deterioration.  Critical care was time spent  personally by me on the following activities: development of treatment plan with patient and/or surrogate as well as nursing, discussions with consultants, evaluation of patient's response to treatment, examination of patient, obtaining history from patient or surrogate, ordering and performing treatments and interventions, ordering and review of laboratory studies, ordering and review of radiographic studies, pulse oximetry and re-evaluation of patient's condition.   Marland Kitchen1-3 Lead EKG Interpretation  Performed by: Paulette Blanch, MD Authorized by: Paulette Blanch, MD     Interpretation: normal     ECG rate:  70   ECG rate assessment: normal     Rhythm: sinus rhythm     Ectopy: none     Conduction: normal   Comments:     Patient placed on cardiac monitor to evaluate for arrthymias    MEDICATIONS ORDERED IN ED: Medications  morphine (PF) 2 MG/ML injection 2 mg (2 mg Intravenous Given 03/09/22 0259)     IMPRESSION / MDM / ASSESSMENT AND PLAN / ED COURSE  I reviewed the triage vital signs and the nursing notes.                             88 year old female presenting with left hip pain status post mechanical fall. Differential diagnosis includes but is not limited to fracture, dislocation, musculoskeletal contusion, etc. I have personally reviewed patient's old records and note a nephrology office visit on 02/27/2022 for CKD.  Patient's presentation is most consistent with acute presentation with potential threat to life or bodily function.  The patient is on the cardiac monitor to evaluate for evidence of arrhythmia and/or significant heart rate changes.  Will obtain basic labwork, xrays. Patient received 41mg Fentanyl per EMS prior to arrival. Keep NPO; hold Eliquis. Will reassess.  Clinical Course as of 03/09/22 0310  Thu Mar 09, 2022  0242 Delay secondary to other critical patients. Updated patient and her POA on laboratory and imaging result. Will inform orthopedics on call and consult  hospitalist services for evaluation and admission. [JS]  0309 Dr. ALinna Capricerequested femur xrays; will keep NPO as he  might perform surgical repair today or tomorrow. [JS]    Clinical Course User Index [JS] Paulette Blanch, MD     FINAL CLINICAL IMPRESSION(S) / ED DIAGNOSES   Final diagnoses:  Fall, initial encounter  Left hip pain     Rx / DC Orders   ED Discharge Orders     None        Note:  This document was prepared using Dragon voice recognition software and may include unintentional dictation errors.   Paulette Blanch, MD 03/09/22 [phone]

## 2022-03-09 NOTE — Assessment & Plan Note (Signed)
Hemoglobin at baseline

## 2022-03-09 NOTE — Op Note (Signed)
Patient Name: Destiny Oliver  NID:[id number]  Pre-Operative Diagnosis: Left hip Intertrochanteric fracture  Post-Operative Diagnosis: (same)  Procedure: Left Hip Intramedullary nail   Components/Implants: Nail:Synthes TFNA 11x21m 130 deg T  Blade:1072mLocking Screw:3652mDate of Surgery: 03/09/2022  Surgeon: ZacSteffanie Rainwater  Anesthesiologist: JohWynetta Emerynesthesia: General   EBL: 100  IVF: 500 Urine: 400144omplications: None   Brief history: The patient is a 89 19ar old female [facility] and found to have a  left hip intertrochanteric fracture.  The patient was admitted by the medical team and optimized for surgery.  A thorough discussion was had with the patient and family about the risks and benefits of surgical intervention for their hip fracture as definitive treatment.  The patient and family opted to proceed with the operation.  All preoperative films were reviewed and an appropriate surgical plan was made prior to surgery.   Description of procedure: The patient was brought to the operating room where laterality was confirmed by all those present to be the left side.  The patient was administered anesthesia on a stretcher prior to being moved supine on the operating room table. Patient was given an intravenous dose of antibiotics for surgical prophylaxis and TXA.  All bony prominences and extremities were well padded and the patient was securely attached to the table boots, a perineal post was placed and the patient had a safety strap placed.  Surgical site was prepped with alcohol and chlorhexidine. The surgical site over the hip was and draped in typical sterile fashion with multiple layers of adhesive and nonadhesive drapes.  The incision site was marked out with a sterile marker under fluoroscopic guidance.    A surgical timeout was then called with participation of all staff in the room the patient was  then a confirmed again and laterality confirmed.  An incision was made just proximal to the greater trochanter through the skin subcutaneous tissues and an incision was made in the glut max fascia.  A guidewire was placed through the greater trochanter at the tip under x-ray guidance into the intertrochanteric region.  The position of this wire was assessed on AP and lateral fluoroscopic images to ensure position.  An opening reamer was used to create access at the tip of the greater trochanter under fluoroscopic guidance.   A size 74m20m25mm39mermediate 130 degree nail was advanced through the reamed hole in the proximal femur and seated within the femur to an appropriate depth under fluoroscopic guidance.  The aiming jig was used to mark an incision site for a lag screw to the femoral head which was then incised with a scalpel.  A wire was then advanced through the lateral cortex of the femur into the center of the femoral head on both AP and lateral fluoroscopic imaging stopping at the subchondral bone without penetration of the femoral head.  This wire was then used to measure for a blade and a size 100mm 33me was inserted into the femoral head through the lateral cortex of the femur and the nail under fluoroscopic guidance.  The setscrew was engaged with the blade screw with a small amount of play left so as to allow compression of the fracture.   The blade inserter was removed and the aiming jig was switched for the distal interlocking screw.  A drill was used to drill a hole for the distal interlocking screw under fluoroscopic guidance and a size 36mm70m  screw was placed.  The intramedullary nail was assessed on AP and lateral fluoroscopic guidance prior to removal of the aiming jig.  Final fluoroscopic x-rays were then taken after removal of the jig.  The nail was found to be in appropriate position on AP and lateral imaging with appropriate lengths of both the blade and the distal locking screw.  The  fascia was closed with #1 Vicryl interrupted figure-of-eight sutures.  The subcutaneous tissues were closed with 2-0 Vicryl and the skin closed with 3-0 Monocryl and Dermabond.  Sterile dressings were applied to the incisions.   The patient was awoken from anesthesia transferred off of the operating room table onto a hospital bed.  The patient had a good pulse postoperatively in the left foot . the patient was then transferred to the PACU in stable condition.

## 2022-03-09 NOTE — H&P (Signed)
History and Physical    Patient: Destiny Oliver RSW:[id number] DOB: 07/03/1926 DOA: 03/09/2022 DOS: the patient was seen and examined on 03/09/2022 PCP: System, Provider Not In  Patient coming from: ALF/ILF  Chief Complaint:  Chief Complaint  Patient presents with   Fall    Patient fell while ambulating to the bathroom. Left lower extremity is shortened and outwardly rotated    HPI: Destiny Oliver is a 88 y.o. female with medical history significant for diastolic CHF, hypertension, type 2 diabetes mellitus, hypothyroidism, CKD 4, paroxysmal A-fib on Eliquis, whoPresents to the ED from the dementia unit following a fall with immediate onset of left hip pain.  Patient had no loss of consciousness.  Reports that she lost her balance falling onto her buttock.  She was previously in her usual state of health.  Had no recent illness.  Denies cough, fever or chills, chest pain, vomiting or diarrhea or abdominal pain. ED course and data review: Mild tachycardia of 102 with otherwise normal vitals.  Creatinine at baseline at 1.87 and hemoglobin 11.5.  Labs otherwise unremarkable.  Troponin normal at 15. EKG, personally viewed and interpreted shows A-fib at 98 with no acute ST-T wave changes.  Chest x-ray nonacute and hip x-ray showing left intertrochanteric femur fracture. The ED provider spoke with orthopedist, Dr. Linna Caprice.  Hospitalist consulted for admission.   Review of Systems: As mentioned in the history of present illness. All other systems reviewed and are negative.  Past Medical History:  Diagnosis Date   Arrhythmia    atrial fibrillation   Breast cancer ([place]) 1994   right breast   CHF (congestive heart failure) (HCC)    CKD (chronic kidney disease)    Diabetes mellitus without complication ([place])    Hyperlipidemia    Hypertension    Pulmonary HTN ([place])    Tachycardia    Past Surgical History:  Procedure Laterality Date   MASTECTOMY Right 1994   Social History:  reports that she  has never smoked. She has never used smokeless tobacco. She reports that she does not drink alcohol and does not use drugs.  Allergies  Allergen Reactions   Penicillins Swelling    No family history on file.  Prior to Admission medications   Medication Sig Start Date End Date Taking? Authorizing Provider  allopurinol (ZYLOPRIM) 100 MG tablet Take 100 mg by mouth daily. 09/12/19   [provider]  amiodarone (PACERONE) 200 MG tablet Take 1 tablet (200 mg total) by mouth daily. 04/30/18   Demetrios Loll, MD  apixaban (ELIQUIS) 2.5 MG TABS tablet Take 1 tablet (2.5 mg total) by mouth 2 (two) times daily. 04/30/18   Demetrios Loll, MD  cholecalciferol (VITAMIN D3) 25 MCG (1000 UT) tablet Take 1,000 Units by mouth daily.    [provider]  colchicine 0.6 MG tablet  05/20/19   [provider]  cyanocobalamin ([employer] VITAMIN B12) 1000 MCG tablet Take 1,000 mcg by mouth daily.     [provider]  docusate sodium (COLACE) 100 MG capsule Take 100 mg by mouth daily.    [provider]  furosemide (LASIX) 40 MG tablet Take 1 tablet (40 mg total) by mouth daily. Patient taking differently: Take 40 mg by mouth daily. And 1/2 tablet in PM 10/06/19 01/13/20  Wyvonnia Dusky, MD  hydrALAZINE (APRESOLINE) 25 MG tablet Take 25 mg by mouth 4 (four) times daily.    [provider]  Lactobacillus TABS Take 1 tablet by mouth in the  morning, at noon, and at bedtime.     [provider]  levothyroxine (SYNTHROID) 75 MCG tablet Take 75 mcg by mouth daily. 10/02/19   [provider]  metoprolol succinate (TOPROL-XL) 50 MG 24 hr tablet Take 1 tablet (50 mg total) by mouth daily. Take with or immediately following a meal. 10/07/19 01/13/20  Wyvonnia Dusky, MD  Multiple Vitamins-Minerals (HAIR SKIN AND NAILS FORMULA) TABS Take 1 tablet by mouth daily.     [provider]  Nutritional Supplements (ENSURE ENLIVE PO) Give by mouth two times daily between  meals  04/30/18   [provider]  OXYGEN Inhale 2 L/min into the lungs continuous. 04/30/18   [provider]  polyethylene glycol powder (GLYCOLAX/MIRALAX) 17 GM/SCOOP powder Take by mouth.    [provider]  potassium chloride SA (KLOR-CON) 20 MEQ tablet Take 20 mEq by mouth daily. 09/12/19   [provider]  pravastatin (PRAVACHOL) 40 MG tablet Take 40 mg by mouth every other day. 02/08/15   [provider]  telmisartan (MICARDIS) 40 MG tablet Take 40 mg by mouth daily.    [provider]    Physical Exam: Vitals:   03/09/22 0023 03/09/22 0030 03/09/22 0100  BP:  124/75   Pulse:  (!) 102 68  Resp:  16 16  Temp:  98.5 F (36.9 C)   TempSrc:  Oral   SpO2:  92% 95%  Weight: 89.3 kg     Physical Exam Vitals and nursing note reviewed.  Constitutional:      General: She is not in acute distress.    Appearance: Normal appearance.  HENT:     Head: Normocephalic and atraumatic.  Cardiovascular:     Rate and Rhythm: Normal rate and regular rhythm.     Heart sounds: Normal heart sounds.  Pulmonary:     Effort: Pulmonary effort is normal.     Breath sounds: Normal breath sounds.  Abdominal:     Palpations: Abdomen is soft.     Tenderness: There is no abdominal tenderness.  Neurological:     Mental Status: Mental status is at baseline. She is disoriented.     Labs on Admission: I have personally reviewed following labs and imaging studies  CBC: Recent Labs  Lab 03/09/22 0026  WBC 7.3  HGB 11.9*  HCT 38.1  MCV 89.4  PLT 629   Basic Metabolic Panel: Recent Labs  Lab 03/09/22 0026  NA 142  K 3.7  CL 111  CO2 21*  GLUCOSE 150*  BUN 36*  CREATININE 1.87*  CALCIUM 9.4   GFR: CrCl cannot be calculated (Unknown ideal weight.). Liver Function Tests: Recent Labs  Lab 03/09/22 0026  AST 21  ALT 15  ALKPHOS 85  BILITOT 0.8  PROT 7.0  ALBUMIN 3.7   No results for input(s): "LIPASE", "AMYLASE" in the last  168 hours. No results for input(s): "AMMONIA" in the last 168 hours. Coagulation Profile: Recent Labs  Lab 03/09/22 0026  INR 1.2   Cardiac Enzymes: No results for input(s): "CKTOTAL", "CKMB", "CKMBINDEX", "TROPONINI" in the last 168 hours. BNP (last 3 results) No results for input(s): "PROBNP" in the last 8760 hours. HbA1C: No results for input(s): "HGBA1C" in the last 72 hours. CBG: No results for input(s): "GLUCAP" in the last 168 hours. Lipid Profile: No results for input(s): "CHOL", "HDL", "LDLCALC", "TRIG", "CHOLHDL", "LDLDIRECT" in the last 72 hours. Thyroid Function Tests: No results for input(s): "TSH", "T4TOTAL", "FREET4", "T3FREE", "THYROIDAB" in the last  72 hours. Anemia Panel: No results for input(s): "VITAMINB12", "FOLATE", "FERRITIN", "TIBC", "IRON", "RETICCTPCT" in the last 72 hours. Urine analysis:    Component Value Date/Time   COLORURINE YELLOW (A) 05/07/2018 1802   APPEARANCEUR CLOUDY (A) 05/07/2018 1802   LABSPEC 1.010 05/07/2018 1802   PHURINE 6.0 05/07/2018 1802   GLUCOSEU NEGATIVE 05/07/2018 1802   HGBUR SMALL (A) 05/07/2018 1802   BILIRUBINUR NEGATIVE 05/07/2018 1802   KETONESUR NEGATIVE 05/07/2018 1802   PROTEINUR NEGATIVE 05/07/2018 1802   NITRITE POSITIVE (A) 05/07/2018 1802   LEUKOCYTESUR LARGE (A) 05/07/2018 1802    Radiological Exams on Admission: DG Chest 1 View  Result Date: 03/09/2022 CLINICAL DATA:  Preop EXAM: CHEST  1 VIEW COMPARISON:  03/31/2021 FINDINGS: Increased interstitial markings. No focal consolidation. No pleural effusion or pneumothorax. Mild cardiomegaly. Surgical clips along the right chest wall/axilla. IMPRESSION: No evidence of acute cardiopulmonary disease. Mild cardiomegaly. Electronically Signed   By: Julian Hy M.D.   On: 03/09/2022 01:40   DG Hip Unilat With Pelvis 2-3 Views Left  Result Date: 03/09/2022 CLINICAL DATA:  Left hip pain after fall EXAM: DG HIP (WITH OR WITHOUT PELVIS) 2-3V LEFT COMPARISON:   Radiographs 04/18/2018 FINDINGS: Acute comminuted displaced left intertrochanteric fracture. Lateral displacement of the distal femoral shaft and medial displacement of the lesser trochanter. The left femoral head remains located in the acetabulum. No additional fractures. IMPRESSION: Left intertrochanteric femur fracture. Electronically Signed   By: Placido Sou M.D.   On: 03/09/2022 01:40     Data Reviewed: Relevant notes from primary care and specialist visits, past discharge summaries as available in EHR, including Care Everywhere. Prior diagnostic testing as pertinent to current admission diagnoses Updated medications and problem lists for reconciliation ED course, including vitals, labs, imaging, treatment and response to treatment Triage notes, nursing and pharmacy notes and ED provider's notes Notable results as noted in HPI   Assessment and Plan: * Intertrochanteric fracture of left femur, closed, initial encounter ([place]) Pain control Keep n.p.o. Consult Ortho Hold Eliquis  Dementia without behavioral disturbance ([place]) Delirium precautions  History of anemia due to CKD Hemoglobin at baseline  Hypothyroidism Continue levothyroxine  Chronic diastolic CHF (congestive heart failure) (HCC) Essential hypertension Stable Continue metoprolol and telmisartan as well as hydralazine Hold furosemide  Paroxysmal atrial fibrillation (HCC) Continue metoprolol and amiodarone Hold Eliquis        DVT prophylaxis: SCD  Consults: Orthopedics, Dr. Linna Caprice  Advance Care Planning:   Code Status: Prior   Family Communication: none  Disposition Plan: Back to previous home environment  Severity of Illness: The appropriate patient status for this patient is INPATIENT. Inpatient status is judged to be reasonable and necessary in order to provide the required intensity of service to ensure the patient's safety. The patient's presenting symptoms, physical exam findings, and  initial radiographic and laboratory data in the context of their chronic comorbidities is felt to place them at high risk for further clinical deterioration. Furthermore, it is not anticipated that the patient will be medically stable for discharge from the hospital within 2 midnights of admission.   * I certify that at the point of admission it is my clinical judgment that the patient will require inpatient hospital care spanning beyond 2 midnights from the point of admission due to high intensity of service, high risk for further deterioration and high frequency of surveillance required.*  Author: Athena Masse, MD 03/09/2022 3:19 AM  For on call review [email or web address].

## 2022-03-09 NOTE — Progress Notes (Signed)
       CROSS COVER NOTE  NAME: ANNELIESE LEBLOND MRN: [id number] DOB : [birth year]    Time of Service   0533  HPI/Events of Note   Notified by nurse patient with soft pressures and remains in afib rate 80-90s Review of labs shows bicarb 21 and AKI both likely from hypotension  Assessment and  Interventions   Assessment:  Plan: 250 LR bolus LR 75 ml/h        Kathlene Cote NP [facility]

## 2022-03-09 NOTE — Progress Notes (Signed)
Patient received from PACU via bed.  Patient is drowsy, arousable.  Answers questions appropriately.  Left hip honeycomb dressing CDI.  Verbalized good sensation to LLE, toes mobile with good capillary refill.  SCDs in place.  Oriented to room and unit routine.  Needs addressed, call bell within reach.  Destiny Oliver at bedside.

## 2022-03-09 NOTE — Assessment & Plan Note (Addendum)
Continue metoprolol and amiodarone Hold Eliquis

## 2022-03-09 NOTE — Transfer of Care (Signed)
Immediate Anesthesia Transfer of Care Note  Patient: Destiny Oliver  Procedure(s) Performed: INTRAMEDULLARY (IM) NAIL INTERTROCHANTERIC (Left: Hip)  Patient Location: PACU  Anesthesia Type:General  Level of Consciousness: awake, alert , and oriented  Airway & Oxygen Therapy: Patient Spontanous Breathing and Patient connected to face mask oxygen  Post-op Assessment: Report given to RN and Post -op Vital signs reviewed and stable  Post vital signs: Reviewed and stable  Last Vitals:  Vitals Value Taken Time  BP 127/68 03/09/22 1545  Temp    Pulse 74 03/09/22 1547  Resp 12 03/09/22 1548  SpO2 100 % 03/09/22 1547  Vitals shown include unvalidated device data.  Last Pain:  Vitals:   03/09/22 1310  TempSrc: Temporal  PainSc: 0-No pain         Complications: No notable events documented.

## 2022-03-10 ENCOUNTER — Encounter: Payer: Self-pay | Admitting: Orthopedic Surgery

## 2022-03-10 DIAGNOSIS — S72142A Displaced intertrochanteric fracture of left femur, initial encounter for closed fracture: Secondary | ICD-10-CM | POA: Diagnosis not present

## 2022-03-10 DIAGNOSIS — W19XXXA Unspecified fall, initial encounter: Secondary | ICD-10-CM

## 2022-03-10 DIAGNOSIS — M25552 Pain in left hip: Secondary | ICD-10-CM

## 2022-03-10 LAB — BPAM RBC
Blood Product Expiration Date: 202312112359
Blood Product Expiration Date: 202312112359
Unit Type and Rh: 5100
Unit Type and Rh: 5100

## 2022-03-10 LAB — TYPE AND SCREEN
ABO/RH(D): O NEG
Antibody Screen: NEGATIVE
Unit division: 0
Unit division: 0

## 2022-03-10 LAB — CBC
HCT: 30.1 % — ABNORMAL LOW (ref 36.0–46.0)
Hemoglobin: 9.5 g/dL — ABNORMAL LOW (ref 12.0–15.0)
MCH: 28.8 pg (ref 26.0–34.0)
MCHC: 31.6 g/dL (ref 30.0–36.0)
MCV: 91.2 fL (ref 80.0–100.0)
Platelets: 158 10*3/uL (ref 150–400)
RBC: 3.3 MIL/uL — ABNORMAL LOW (ref 3.87–5.11)
RDW: 14.2 % (ref 11.5–15.5)
WBC: 7.1 10*3/uL (ref 4.0–10.5)
nRBC: 0 % (ref 0.0–0.2)

## 2022-03-10 LAB — BASIC METABOLIC PANEL
Anion gap: 4 — ABNORMAL LOW (ref 5–15)
BUN: 29 mg/dL — ABNORMAL HIGH (ref 8–23)
CO2: 24 mmol/L (ref 22–32)
Calcium: 8.5 mg/dL — ABNORMAL LOW (ref 8.9–10.3)
Chloride: 115 mmol/L — ABNORMAL HIGH (ref 98–111)
Creatinine, Ser: 1.62 mg/dL — ABNORMAL HIGH (ref 0.44–1.00)
GFR, Estimated: 29 mL/min — ABNORMAL LOW (ref >60)
Glucose, Bld: 126 mg/dL — ABNORMAL HIGH (ref 70–99)
Potassium: 3.9 mmol/L (ref 3.5–5.1)
Sodium: 143 mmol/L (ref 135–145)

## 2022-03-10 LAB — GLUCOSE, CAPILLARY
Glucose-Capillary: 152 mg/dL — ABNORMAL HIGH (ref 70–99)
Glucose-Capillary: 174 mg/dL — ABNORMAL HIGH (ref 70–99)
Glucose-Capillary: 148 mg/dL — ABNORMAL HIGH (ref 70–99)

## 2022-03-10 LAB — PREPARE RBC (CROSSMATCH)

## 2022-03-10 MED ORDER — ADULT MULTIVITAMIN W/MINERALS CH
1.0000 | ORAL_TABLET | Freq: Every day | ORAL | Status: DC
  Administered 2022-03-10 – 2022-03-14 (×5): 1 via ORAL
  Filled 2022-03-10 (×5): qty 1

## 2022-03-10 MED ORDER — ACETAMINOPHEN 325 MG PO TABS: 325.0000 mg | ORAL_TABLET | Freq: Four times a day (QID) | ORAL | Status: AC | PRN

## 2022-03-10 MED ORDER — ENSURE ENLIVE PO LIQD
237.0000 mL | Freq: Two times a day (BID) | ORAL | Status: DC
  Administered 2022-03-10 – 2022-03-14 (×7): 237 mL via ORAL

## 2022-03-10 MED ORDER — HYDROCODONE-ACETAMINOPHEN 5-325 MG PO TABS: 1.0000 | ORAL_TABLET | ORAL | 0 refills | Status: DC | PRN

## 2022-03-10 MED ORDER — POLYETHYLENE GLYCOL 3350 17 G PO PACK
17.0000 g | PACK | Freq: Two times a day (BID) | ORAL | Status: DC
  Administered 2022-03-10 – 2022-03-13 (×7): 17 g via ORAL
  Filled 2022-03-10 (×8): qty 1

## 2022-03-10 MED ORDER — MIDODRINE HCL 5 MG PO TABS
10.0000 mg | ORAL_TABLET | Freq: Three times a day (TID) | ORAL | Status: DC
  Administered 2022-03-10 – 2022-03-14 (×9): 10 mg via ORAL
  Filled 2022-03-10 (×9): qty 2

## 2022-03-10 MED ORDER — HYDROCODONE-ACETAMINOPHEN 5-325 MG PO TABS
1.0000 | ORAL_TABLET | ORAL | Status: DC | PRN
  Administered 2022-03-10 – 2022-03-14 (×6): 1 via ORAL
  Filled 2022-03-10 (×7): qty 1

## 2022-03-10 NOTE — Progress Notes (Signed)
Physical Therapy Treatment Patient Details Name: Destiny Oliver MRN: [id number] DOB: 1926/11/03 Today's Date: 03/10/2022   History of Present Illness Pt is 88 y/o female s/p mechanical fall results in L LE IM nailing. Pt with PMH of CHF, HTN,and DM.    PT Comments    Patient tolerated second session well today. HR 110s-120s with exertion. She was found in chair, visitor present. Patient received pain medication prior to session. PT reviewed LE exercises with patient sitting in chair, prior to mobility. Patient performed sit to stand transfer with contact guard assist, patient ambulated 5 feet with rolling walker to bedside commode. Patient voiced 8/10 L hip pain with activity. Patient with continued LLE antalgic gait but is overall strong and motivated to get better. Patient able to void while seated on [facility], patient then stood again but she did require assistance from PT for pericare hygiene. Patient more fatigued from sitting on BSC, patient was able to ambulate 5 feet back to EOB. Patient did required moderate assist x 2 for sit to supine transfer. Patient then was repositioned and left semi reclined in bed, bed alarm engaged. Encouraged patient to mobilize with both therapy and nursing staff, encouraged her to sit in recliner for meals and to perform LE exercises 2-3x/day.   Recommendations for follow up therapy are one component of a multi-disciplinary discharge planning process, led by the attending physician.  Recommendations may be updated based on patient status, additional functional criteria and insurance authorization.  Follow Up Recommendations  Skilled nursing-short term rehab (<3 hours/day) Can patient physically be transported by private vehicle: No   Assistance Recommended at Discharge Intermittent Supervision/Assistance  Patient can return home with the following A little help with walking and/or transfers;Help with stairs or ramp for entrance;A little help with  bathing/dressing/bathroom;Assist for transportation   Equipment Recommendations   (pt. has RW available at home)    Recommendations for Other Services       Precautions / Restrictions Precautions Precautions: Fall Restrictions Weight Bearing Restrictions: Yes LLE Weight Bearing: Weight bearing as tolerated     Mobility  Bed Mobility Overal bed mobility: Needs Assistance Bed Mobility: Sit to Supine       Sit to supine: Mod assist   General bed mobility comments:  (cues for bed mobility technique)    Transfers Overall transfer level: Needs assistance Equipment used: Rolling walker (2 wheels) Transfers: Sit to/from Stand Sit to Stand: Min guard           General transfer comment: cues for BUE assist    Ambulation/Gait Ambulation/Gait assistance: Min assist Gait Distance (Feet): 10 Feet Assistive device: Rolling walker (2 wheels) Gait Pattern/deviations: Antalgic, Decreased step length - left, Narrow base of support       General Gait Details: cues required for widening base of support and increased LLE step length   Stairs             Wheelchair Mobility    Modified Rankin (Stroke Patients Only)       Balance Overall balance assessment: Needs assistance Sitting-balance support: Feet supported Sitting balance-Leahy Scale: Good Sitting balance - Comments: able to sit on BSC and at EOB with close supervision Postural control: Right lateral lean (2/2 pain) Standing balance support: Reliant on assistive device for balance, Bilateral upper extremity supported, During functional activity Standing balance-Leahy Scale: Fair Standing balance comment: patient required BUE assist on RW in standing  Cognition Arousal/Alertness: Awake/alert Behavior During Therapy: WFL for tasks assessed/performed Overall Cognitive Status: Within Functional Limits for tasks assessed                                  General Comments: Pt is pleasant and cooperative throughout session. She is HOH.        Exercises General Exercises - Lower Extremity Ankle Circles/Pumps: AROM, Both, 10 reps Quad Sets: AROM, Both, 5 reps Hip ABduction/ADduction: AROM, Strengthening, Seated, 10 reps Heel Raises: AROM, Strengthening, 10 reps, Seated, Both    General Comments        Pertinent Vitals/Pain Pain Assessment Pain Assessment: 0-10 Pain Score: 8  Faces Pain Scale: Hurts a little bit Pain Location: L LE Pain Descriptors / Indicators: Aching, Discomfort Pain Intervention(s): Premedicated before session, Monitored during session, Repositioned, Limited activity within patient's tolerance    Home Living                          Prior Function            PT Goals (current goals can now be found in the care plan section) Acute Rehab PT Goals Patient Stated Goal: "go home" PT Goal Formulation: With patient Time For Goal Achievement: 03/24/22 Potential to Achieve Goals: Good Progress towards PT goals: Progressing toward goals    Frequency    BID      PT Plan Current plan remains appropriate    Co-evaluation              AM-PAC PT "6 Clicks" Mobility   Outcome Measure  Help needed turning from your back to your side while in a flat bed without using bedrails?: A Little Help needed moving from lying on your back to sitting on the side of a flat bed without using bedrails?: A Little Help needed moving to and from a bed to a chair (including a wheelchair)?: A Little Help needed standing up from a chair using your arms (e.g., wheelchair or bedside chair)?: A Little Help needed to walk in hospital room?: A Little Help needed climbing 3-5 steps with a railing? : A Lot 6 Click Score: 17    End of Session   Activity Tolerance: Patient limited by pain;Patient limited by fatigue Patient left: in bed;with call bell/phone within reach;with bed alarm set;with family/visitor  present Nurse Communication: Mobility status PT Visit Diagnosis: History of falling (Z91.81);Other abnormalities of gait and mobility (R26.89)     Time: 2706-2376 PT Time Calculation (min) (ACUTE ONLY): 50 min  Charges:  $Therapeutic Activity: 38-52 mins                     Clemetine Marker, PT, DPT, CCS, GCS    Derrell Lolling 03/10/2022, 3:12 PM

## 2022-03-10 NOTE — NC FL2 (Signed)
[place] LEVEL OF CARE SCREENING TOOL     IDENTIFICATION  Patient Name: Destiny Oliver Birthdate: 1927/04/21 Sex: female Admission Date (Current Location): 03/09/2022  [place] and [place] Number:  Engineering geologist and Address:  [facility], [address] [place], [place] [address]      Provider Number: [id number]  Attending Physician Name and Address:  Richarda Osmond, MD  Relative Name and Phone Number:  Dorothy Puffer  [phone]    Current Level of Care: Hospital Recommended Level of Care: [place] Prior Approval Number:    Date Approved/Denied:   PASRR Number: [id number]  Discharge Plan: SNF    Current Diagnoses: Patient Active Problem List   Diagnosis Date Noted   Intertrochanteric fracture of left femur, closed, initial encounter ([place]) 03/09/2022   Type 2 diabetes mellitus ([place]) 03/09/2022   Hypothyroidism 03/09/2022   History of anemia due to CKD 03/09/2022   Dementia without behavioral disturbance ([place]) 03/09/2022   Chronic diastolic CHF (congestive heart failure) ([place]) 10/02/2019   HCAP (healthcare-associated pneumonia) 05/23/2018   UTI due to extended-spectrum beta lactamase (ESBL) producing Escherichia coli 05/15/2018   Diplopia 05/08/2018   Vertigo 05/08/2018   Type 2 diabetes mellitus with stage 4 chronic kidney disease and hypertension ([place]) 05/02/2018   CKD (chronic kidney disease) stage 4, GFR 15-29 ml/min ([place]) 05/02/2018   Hypertensive heart and kidney disease with chronic diastolic congestive heart failure and stage 4 chronic kidney disease ([place]) 05/02/2018   Acute on chronic diastolic heart failure ([place]) 05/02/2018   Dyslipidemia associated with type 2 diabetes mellitus ([place]) 05/02/2018   Anemia associated with stage 4 chronic renal failure ([place]) 05/02/2018   Acute on chronic respiratory failure with hypoxia ([place]) 05/02/2018   Rhabdomyolysis 04/29/2018   Paroxysmal atrial  fibrillation ([place]) 04/18/2018    Orientation RESPIRATION BLADDER Height & Weight     Self, Time, Situation, Place  Normal Continent Weight: 83 kg Height:  5' (152.4 cm)  BEHAVIORAL SYMPTOMS/MOOD NEUROLOGICAL BOWEL NUTRITION STATUS      Continent Diet (see dc summary)  AMBULATORY STATUS COMMUNICATION OF NEEDS Skin   Extensive Assist   Normal, Surgical wounds                       Personal Care Assistance Level of Assistance  Bathing, Feeding, Dressing Bathing Assistance: Maximum assistance Feeding assistance: Limited assistance Dressing Assistance: Maximum assistance     Functional Limitations Info             SPECIAL CARE FACTORS FREQUENCY  PT (By licensed PT), OT (By licensed OT)     PT Frequency: 5 times per week OT Frequency: 5 times per week            Contractures Contractures Info: Not present    Additional Factors Info  Code Status, Allergies Code Status Info: DNR Allergies Info: Penicillin           Current Medications (03/10/2022):  This is the current hospital active medication list Current Facility-Administered Medications  Medication Dose Route Frequency Provider Last Rate Last Admin   acetaminophen (TYLENOL) tablet 325-650 mg  325-650 mg Oral Q6H PRN Steffanie Rainwater, MD       acetaminophen (TYLENOL) tablet 500 mg  500 mg Oral Q6H Steffanie Rainwater, MD   500 mg at 03/10/22 0602   albuterol (VENTOLIN HFA) 108 (90 Base) MCG/ACT inhaler 2 puff  2 puff Inhalation Q6H PRN Steffanie Rainwater, MD  apixaban (ELIQUIS) tablet 2.5 mg  2.5 mg Oral BID Steffanie Rainwater, MD       fentaNYL (SUBLIMAZE) injection 12.5 mcg  12.5 mcg Intravenous Q2H PRN Steffanie Rainwater, MD   12.5 mcg at 03/09/22 1004   ferrous sulfate tablet 325 mg  325 mg Oral q AM Steffanie Rainwater, MD   325 mg at 03/10/22 0603   hydrALAZINE (APRESOLINE) tablet 50 mg  50 mg Oral QID Steffanie Rainwater, MD   50 mg at 03/09/22 2157   HYDROcodone-acetaminophen (NORCO/VICODIN) 5-325 MG per  tablet 1-2 tablet  1-2 tablet Oral Q4H PRN Steffanie Rainwater, MD   1 tablet at 03/10/22 0818   irbesartan (AVAPRO) tablet 150 mg  150 mg Oral Daily Steffanie Rainwater, MD   150 mg at 03/09/22 1821   lactated ringers infusion   Intravenous Continuous Steffanie Rainwater, MD 100 mL/hr at 03/09/22 1345 Restarted at 03/09/22 1404   levothyroxine (SYNTHROID) tablet 75 mcg  75 mcg Oral Daily Steffanie Rainwater, MD   75 mcg at 03/09/22 1820   menthol-cetylpyridinium (CEPACOL) lozenge 3 mg  1 lozenge Oral PRN Steffanie Rainwater, MD       Or   phenol (CHLORASEPTIC) mouth spray 1 spray  1 spray Mouth/Throat PRN Steffanie Rainwater, MD       metoCLOPramide (REGLAN) tablet 5-10 mg  5-10 mg Oral Q8H PRN Steffanie Rainwater, MD       Or   metoCLOPramide (REGLAN) injection 5-10 mg  5-10 mg Intravenous Q8H PRN Steffanie Rainwater, MD       metoprolol succinate (TOPROL-XL) 24 hr tablet 50 mg  50 mg Oral q AM Steffanie Rainwater, MD   50 mg at 03/10/22 0805   mupirocin ointment (BACTROBAN) 2 % 1 Application  1 Application Nasal BID Steffanie Rainwater, MD       neomycin-polymyxin b-dexamethasone (MAXITROL) ophthalmic ointment 1 Application  1 Application Both Eyes QHS Steffanie Rainwater, MD   1 Application at 40/98/11 2158   ondansetron (ZOFRAN) tablet 4 mg  4 mg Oral Q6H PRN Steffanie Rainwater, MD   4 mg at 03/10/22 0817   Or   ondansetron (ZOFRAN) injection 4 mg  4 mg Intravenous Q6H PRN Steffanie Rainwater, MD       polyvinyl alcohol (LIQUIFILM TEARS) 1.4 % ophthalmic solution 1 drop  1 drop Both Eyes Q2H while awake Steffanie Rainwater, MD   1 drop at 03/10/22 0805   potassium chloride SA (KLOR-CON M) CR tablet 20 mEq  20 mEq Oral Daily Steffanie Rainwater, MD   20 mEq at 03/09/22 1821   torsemide (DEMADEX) tablet 20 mg  20 mg Oral Daily Steffanie Rainwater, MD   20 mg at 03/09/22 1821     Discharge Medications: Please see discharge summary for a list of discharge medications.  Relevant Imaging Results:  Relevant Lab Results:   Additional  Information SS# [id number]  Conception Oms, RN

## 2022-03-10 NOTE — Progress Notes (Signed)
Initial Nutrition Assessment  DOCUMENTATION CODES:   Not applicable  INTERVENTION:  Encourage adequate PO intake Meal ordering with assistance Ensure Enlive po BID, each supplement provides 350 kcal and 20 grams of protein. MVI with minerals daily  NUTRITION DIAGNOSIS:   Increased nutrient needs related to hip fracture as evidenced by estimated needs.  GOAL:   Patient will meet greater than or equal to 90% of their needs  MONITOR:   PO intake, Labs, Supplement acceptance  REASON FOR ASSESSMENT:   Consult Hip fracture protocol  ASSESSMENT:   Pt admitted from ALF after a fall leading to L femur fracture. PMH significant for dementia, dCHF, HTN, T2DM, hypothyroidism, CKD 4, paroxysmal afib.   11/9 s/p IMN L hip  Pt sitting in chair at time of visit. She endorses eating really well with a good appetite. PO intake has been at her baseline. She is provided 3 meals per day at her ALF. She states that they have a new chef and sometimes does not enjoy what is prepared. She always has an alternate option such as cereal or a sandwich if she does not like the meal they have prepared.   Pt denies chewing/swallowing difficulties.   At her ALF, she usually walks frequently around her facility.   Her family member/friend at bedside today reports that she has encountered some weight loss within the last year but neither of them are able to recall her usual weight or how much weight loss she has had.   Unfortunately, there is limited documentation of weight history on file within the last year. Current admission weight is 83 kg. Last documented weight was 93.4 kg on 03/31/21. This is an 11.1% weight loss which is not clinically significant for time frame.   Medications: ferrous sulfate, klor-con, torsemide  Labs: BUN 29, Cr 1.62, anion gap 4, GFR 29, HgbA1c 5.5%, CBG's 106-168 x24 hours  NUTRITION - FOCUSED PHYSICAL EXAM:  Flowsheet Row Most Recent Value  Orbital Region No depletion   Upper Arm Region No depletion  Thoracic and Lumbar Region No depletion  Buccal Region No depletion  Temple Region No depletion  Clavicle Bone Region No depletion  Clavicle and Acromion Bone Region No depletion  Scapular Bone Region No depletion  Dorsal Hand Mild depletion  Patellar Region No depletion  Anterior Thigh Region No depletion  Posterior Calf Region Mild depletion  Edema (RD Assessment) None  Hair Reviewed  Eyes Reviewed  Mouth Reviewed  Skin Reviewed  Nails Reviewed       Diet Order:   Diet Order             Diet regular Room service appropriate? Yes; Fluid consistency: Thin  Diet effective now                   EDUCATION NEEDS:   No education needs have been identified at this time  Skin:  Skin Assessment: Reviewed RN Assessment (L hip close incision)  Last BM:  11/8  Height:   Ht Readings from Last 1 Encounters:  03/09/22 5' (1.524 m)    Weight:   Wt Readings from Last 1 Encounters:  03/09/22 83 kg   BMI:  Body mass index is 35.74 kg/m.  Estimated Nutritional Needs:   Kcal:  1200-1400  Protein:  60-75g  Fluid:  1.2-1.4L  Clayborne Dana, RDN, LDN Clinical Nutrition

## 2022-03-10 NOTE — Evaluation (Signed)
Physical Therapy Evaluation Patient Details Name: Destiny Oliver MRN: [id number] DOB: 21-Aug-1926 Today's Date: 03/10/2022  History of Present Illness  Pt is 88 y/o female s/p mechanical fall results in L LE IM nailing. Pt with PMH of CHF, HTN,and DM.  Clinical Impression  Patient presents with increased pain and decreased endurance/activity tolerance, s/p LLE IM nailing. Patient reports living in [facility] facility, and reports she is typically modified independent/supervision level for mobility and ADL's. Patient reports at baseline she is able to walk to dining hall for meals; she was receiving PT services 1x/week. Patient also reports no other recent falls.  Today, co session with OT for patient safety and energy conservation. Patient was found seated in recliner, visitor present. Educated patient on WBAT precautions, patient reported 2/10 L surgical pain. Patient required contact guard assist for sit to stand transfer; patient ambulated a total of 16' in room with rolling walker (8' forward + 6' backwards), patient with decreased step length on LLE and antalgic gait. Patient became tachycardic into high 130's-mid 140's with exertion. Patient was limited by pain and decreased endurance. At this time, recommending D/C to SNF for short term rehab prior to returning to her previous facility.      03/10/22 0940  Vital Signs  Pulse Rate (!) 115 (up to 145 with mobility)  Pulse Rate Source Monitor       Recommendations for follow up therapy are one component of a multi-disciplinary discharge planning process, led by the attending physician.  Recommendations may be updated based on patient status, additional functional criteria and insurance authorization.  Follow Up Recommendations Skilled nursing-short term rehab (<3 hours/day) Can patient physically be transported by private vehicle: No    Assistance Recommended at Discharge Frequent or constant Supervision/Assistance  Patient can return  home with the following  A little help with walking and/or transfers;Help with stairs or ramp for entrance;A little help with bathing/dressing/bathroom;Assist for transportation    Equipment Recommendations  (patient reports having access to RW, defer (recommending SNF))  Recommendations for Other Services       Functional Status Assessment Patient has had a recent decline in their functional status and demonstrates the ability to make significant improvements in function in a reasonable and predictable amount of time.     Precautions / Restrictions Precautions Precautions: Fall Restrictions Weight Bearing Restrictions: Yes LLE Weight Bearing: Weight bearing as tolerated      Mobility  Bed Mobility                    Transfers Overall transfer level: Needs assistance Equipment used: Rolling walker (2 wheels) Transfers: Sit to/from Stand Sit to Stand: Min guard           General transfer comment: patient required brief cues for hand placement prior to STS transfer    Ambulation/Gait Ambulation/Gait assistance: Min assist Gait Distance (Feet): 14 Feet (8' forward + 6' backwards) Assistive device: Rolling walker (2 wheels) Gait Pattern/deviations: Antalgic, Decreased step length - left, Narrow base of support       General Gait Details: cues required for widening base of support and increased LLE step length  Stairs            Wheelchair Mobility    Modified Rankin (Stroke Patients Only)       Balance Overall balance assessment: Needs assistance Sitting-balance support: Feet supported, Bilateral upper extremity supported Sitting balance-Leahy Scale: Good     Standing balance support: Bilateral upper extremity supported (BUE  support on RW) Standing balance-Leahy Scale: Fair Standing balance comment: patient required BUE assist on RW in standing                             Pertinent Vitals/Pain Pain Assessment Pain Assessment:  Faces Faces Pain Scale: Hurts a little bit Pain Location: L hip Pain Descriptors / Indicators: Discomfort, Aching Pain Intervention(s): Repositioned, Limited activity within patient's tolerance    Home Living Family/patient expects to be discharged to:: Assisted living                 Home Equipment: Conservation officer, nature (2 wheels)      Prior Function Prior Level of Function : Independent/Modified Independent             Mobility Comments: patient reports being independent with mobility at [facility] (ILF) ADLs Comments: Pt reports bathing and dressing independently at ALF as well as ambulation to dining hall.     Hand Dominance   Dominant Hand: Right    Extremity/Trunk Assessment   Upper Extremity Assessment Upper Extremity Assessment: Overall WFL for tasks assessed    Lower Extremity Assessment Lower Extremity Assessment: LLE deficits/detail (RLE WFL) LLE Deficits / Details: decreased hip flexion ROM 2/2 pain and guarding LLE Sensation: WNL       Communication   Communication: HOH  Cognition Arousal/Alertness: Awake/alert                                     General Comments: Patient is motivated to work with therapy        General Comments General comments (skin integrity, edema, etc.): L hip surgical incision    Exercises General Exercises - Lower Extremity Ankle Circles/Pumps: AROM, Both, 10 reps Quad Sets: AROM, Both, 5 reps   Assessment/Plan    PT Assessment Patient needs continued PT services  PT Problem List Decreased strength;Decreased range of motion;Decreased activity tolerance;Decreased balance;Decreased mobility;Pain       PT Treatment Interventions Gait training;Functional mobility training;Therapeutic activities;Therapeutic exercise;Balance training;Patient/family education;DME instruction    PT Goals (Current goals can be found in the Care Plan section)  Acute Rehab PT Goals Patient Stated Goal: "go home" PT Goal  Formulation: With patient Time For Goal Achievement: 03/24/22 Potential to Achieve Goals: Good    Frequency BID     Co-evaluation PT/OT/SLP Co-Evaluation/Treatment: Yes Reason for Co-Treatment: For patient/therapist safety PT goals addressed during session: Mobility/safety with mobility OT goals addressed during session: ADL's and self-care       AM-PAC PT "6 Clicks" Mobility  Outcome Measure Help needed turning from your back to your side while in a flat bed without using bedrails?: A Little Help needed moving from lying on your back to sitting on the side of a flat bed without using bedrails?: A Little Help needed moving to and from a bed to a chair (including a wheelchair)?: A Little Help needed standing up from a chair using your arms (e.g., wheelchair or bedside chair)?: A Little Help needed to walk in hospital room?: A Little Help needed climbing 3-5 steps with a railing? : A Lot 6 Click Score: 17    End of Session   Activity Tolerance: Patient limited by fatigue;Patient limited by pain (tachycardic into 140's with minimal exertion) Patient left: in chair;with call bell/phone within reach;with chair alarm set;with family/visitor present Nurse Communication: Mobility status PT Visit  Diagnosis: History of falling (Z91.81);Other abnormalities of gait and mobility (R26.89)    Time: 7076-1518 PT Time Calculation (min) (ACUTE ONLY): 24 min   Charges:   PT Evaluation $PT Eval Moderate Complexity: 1 Mod          Clemetine Marker, PT, DPT, CCS, GCS   Derrell Lolling 03/10/2022, 12:08 PM

## 2022-03-10 NOTE — Evaluation (Signed)
Occupational Therapy Evaluation Patient Details Name: Destiny Oliver MRN: [id number] DOB: 11/13/1926 Today's Date: 03/10/2022   History of Present Illness Pt is 88 y/o female s/p mechanical fall results in L LE IM nailing. Pt with PMH of CHF, HTN,and DM.   Clinical Impression   Patient presenting with decreased Ind in self care, balance, functional mobility/transfers, endurance, and safety awareness. Patient reports living at [facility] ALF and ambulates at mod I level with use of RW. She also endorses performing ADLs without assistance from staff. Session limited secondary to fatigue and increased HR into 140's with mobility tasks. She is very motivated for therapeutic intervention.  Patient currently functioning at min guard for functional transfers but will need more physical assistance for LB ADLs. Patient will benefit from acute OT to increase overall independence in the areas of ADLs, functional mobility, and safety awareness in order to safely discharge to next venue of care.      Recommendations for follow up therapy are one component of a multi-disciplinary discharge planning process, led by the attending physician.  Recommendations may be updated based on patient status, additional functional criteria and insurance authorization.   Follow Up Recommendations  Skilled nursing-short term rehab (<3 hours/day)    Assistance Recommended at Discharge Intermittent Supervision/Assistance  Patient can return home with the following A little help with walking and/or transfers;A little help with bathing/dressing/bathroom;Help with stairs or ramp for entrance;Assist for transportation    Functional Status Assessment  Patient has had a recent decline in their functional status and demonstrates the ability to make significant improvements in function in a reasonable and predictable amount of time.  Equipment Recommendations  Other (comment) (defer to next venue of care)       Precautions /  Restrictions Precautions Precautions: Fall Restrictions Weight Bearing Restrictions: Yes LLE Weight Bearing: Weight bearing as tolerated      Mobility Bed Mobility               General bed mobility comments: seated in recliner chair at beginning/end of session    Transfers Overall transfer level: Needs assistance Equipment used: Rolling walker (2 wheels) Transfers: Sit to/from Stand Sit to Stand: Min guard                  Balance Overall balance assessment: Needs assistance Sitting-balance support: Feet supported Sitting balance-Leahy Scale: Good     Standing balance support: Reliant on assistive device for balance, Bilateral upper extremity supported, During functional activity Standing balance-Leahy Scale: Fair                             ADL either performed or assessed with clinical judgement   ADL Overall ADL's : Needs assistance/impaired                         Toilet Transfer: Min guard;Ambulation;Rolling walker (2 wheels) Toilet Transfer Details (indicate cue type and reason): simulated           General ADL Comments: Pt will likely need min - mod A for LB self care. Set up A for UB self care. She is very motivated and pleasant.     Vision Patient Visual Report: No change from baseline              Pertinent Vitals/Pain Pain Assessment Pain Assessment: Faces Faces Pain Scale: Hurts a little bit Pain Location: L LE Pain Descriptors / Indicators:  Discomfort Pain Intervention(s): Repositioned, Monitored during session, Premedicated before session     Hand Dominance Right   Extremity/Trunk Assessment Upper Extremity Assessment Upper Extremity Assessment: Overall WFL for tasks assessed           Communication Communication Communication: HOH   Cognition Arousal/Alertness: Awake/alert Behavior During Therapy: WFL for tasks assessed/performed Overall Cognitive Status: Within Functional Limits for tasks  assessed                                 General Comments: Pt is pleasant and cooperative throughout session. She is HOH.                Home Living Family/patient expects to be discharged to:: Assisted living                                        Prior Functioning/Environment Prior Level of Function : Independent/Modified Independent             Mobility Comments: patient reports being independent with mobility at [facility] (ILF) ADLs Comments: Pt reports bathing and dressing independently at ALF as well as ambulation to dining hall.        OT Problem List: Decreased strength;Decreased activity tolerance;Impaired balance (sitting and/or standing);Decreased safety awareness;Decreased knowledge of use of DME or AE      OT Treatment/Interventions: Self-care/ADL training;Therapeutic exercise;Therapeutic activities;Balance training;DME and/or AE instruction;Patient/family education;Energy conservation    OT Goals(Current goals can be found in the care plan section) Acute Rehab OT Goals Patient Stated Goal: to return to PLOF OT Goal Formulation: With patient Time For Goal Achievement: 03/24/22 Potential to Achieve Goals: Good ADL Goals Pt Will Perform Lower Body Dressing: with min guard assist;sit to/from stand Pt Will Transfer to Toilet: with supervision;ambulating Pt Will Perform Toileting - Clothing Manipulation and hygiene: with supervision Pt/caregiver will Perform Home Exercise Program: With theraband;Both right and left upper extremity;Increased strength;With written HEP provided;With Supervision  OT Frequency: Min 2X/week    Co-evaluation PT/OT/SLP Co-Evaluation/Treatment: Yes Reason for Co-Treatment: For patient/therapist safety PT goals addressed during session: Mobility/safety with mobility OT goals addressed during session: ADL's and self-care      AM-PAC OT "6 Clicks" Daily Activity     Outcome Measure Help from another  person eating meals?: None Help from another person taking care of personal grooming?: A Little Help from another person toileting, which includes using toliet, bedpan, or urinal?: A Lot Help from another person bathing (including washing, rinsing, drying)?: A Lot Help from another person to put on and taking off regular upper body clothing?: A Little Help from another person to put on and taking off regular lower body clothing?: A Lot 6 Click Score: 16   End of Session Equipment Utilized During Treatment: Rolling walker (2 wheels) Nurse Communication: Mobility status;Other (comment) (HR elevated)  Activity Tolerance: Patient tolerated treatment well Patient left: in chair;with call bell/phone within reach;with chair alarm set;with family/visitor present  OT Visit Diagnosis: Unsteadiness on feet (R26.81);Repeated falls (R29.6);Muscle weakness (generalized) (M62.81);History of falling (Z91.81)                Time: 9702-6378 OT Time Calculation (min): 16 min Charges:  OT General Charges $OT Visit: 1 Visit OT Evaluation $OT Eval Low Complexity: 1 Low Darleen Crocker, MS, OTR/L , CBIS ascom [phone]  03/10/22, 10:53 AM

## 2022-03-10 NOTE — TOC Progression Note (Signed)
Transition of Care [facility]) - Progression Note    Patient Details  Name: Destiny Oliver MRN: [id number] Date of Birth: 01-23-1927  Transition of Care [facility] CM/SW [place], RN Phone Number: 03/10/2022, 10:06 AM  Clinical Narrative:    Floydene Flock sent, PASSR obtained, Fl2 completed, Will need STR likely Will review bed offers once obtained   Expected Discharge Plan: [place] Barriers to Discharge: SNF Pending bed offer  Expected Discharge Plan and Services Expected Discharge Plan: [place]   Discharge Planning Services: CM Consult   Living arrangements for the past 2 months: Assisted Living Facility                                       Social Determinants of Health (SDOH) Interventions    Readmission Risk Interventions     No data to display

## 2022-03-10 NOTE — Progress Notes (Addendum)
   Subjective: 1 Day Post-Op Procedure(s) (LRB): INTRAMEDULLARY (IM) NAIL INTERTROCHANTERIC (Left) Patient reports pain as mild.   Patient is well, and has had no acute complaints or problems Denies any CP, SOB, ABD pain. We will continue therapy today.  Plan is to go Skilled nursing facility after hospital stay.  Objective: Vital signs in last 24 hours: Temp:  [96.8 F (36 C)-98.3 F (36.8 C)] 98 F (36.7 C) (11/10 0752) Pulse Rate:  [70-108] 97 (11/10 0752) Resp:  [11-20] 18 (11/10 0752) BP: (100-130)/(49-75) 100/62 (11/10 0752) SpO2:  [86 %-100 %] 97 % (11/10 0752) Weight:  [83 kg-86.2 kg] 83 kg (11/09 1310)  Intake/Output from previous day: 11/09 0701 - 11/10 0700 In: 1400 [P.O.:100; I.V.:700; IV Piggyback:600] Out: 6415 [Urine:1550; Emesis/NG output:45; Blood:100] Intake/Output this shift: No intake/output data recorded.  Recent Labs    03/09/22 0026 03/10/22 0534  HGB 11.9* 9.5*   Recent Labs    03/09/22 0026 03/10/22 0534  WBC 7.3 7.1  RBC 4.26 3.30*  HCT 38.1 30.1*  PLT 193 158   Recent Labs    03/09/22 0026 03/10/22 0534  NA 142 143  K 3.7 3.9  CL 111 115*  CO2 21* 24  BUN 36* 29*  CREATININE 1.87* 1.62*  GLUCOSE 150* 126*  CALCIUM 9.4 8.5*   Recent Labs    03/09/22 0026  INR 1.2    EXAM General - Patient is Alert, Appropriate, and Oriented Extremity - Neurovascular intact Sensation intact distally Intact pulses distally Dorsiflexion/Plantar flexion intact Dressing - dressing C/D/I and no drainage Motor Function - intact, moving foot and toes well on exam.   Past Medical History:  Diagnosis Date   Arrhythmia    atrial fibrillation   Breast cancer ([place]) 1994   right breast   CHF (congestive heart failure) (HCC)    CKD (chronic kidney disease)    Diabetes mellitus without complication (HCC)    Hyperlipidemia    Hypertension    Pulmonary HTN (HCC)    Tachycardia     Assessment/Plan:   1 Day Post-Op Procedure(s)  (LRB): INTRAMEDULLARY (IM) NAIL INTERTROCHANTERIC (Left) Principal Problem:   Intertrochanteric fracture of left femur, closed, initial encounter ([place]) Active Problems:   Paroxysmal atrial fibrillation (HCC)   CKD (chronic kidney disease) stage 4, GFR 15-29 ml/min (HCC)   Chronic diastolic CHF (congestive heart failure) (HCC)   Type 2 diabetes mellitus (HCC)   Hypothyroidism   History of anemia due to CKD   Dementia without behavioral disturbance (HCC)  Estimated body mass index is 35.74 kg/m as calculated from the following:   Height as of this encounter: 5' (1.524 m).   Weight as of this encounter: 83 kg. Advance diet Up with therapy Pain well controlled VSS Labs stable Care management to assist with discharge to skilled nursing facility.  Patient resides at [facility] assisted living. Uncertain if they can provide patient with SNF level of care.  Follow-up with [facility] orthopedics in 2 weeks Resume Eliquis TED hose bilateral lower extremity x2 weeks    DVT Prophylaxis -  SCDs  Eliquis Weight-Bearing as tolerated to left leg   T. Rachelle Hora, PA-C [place] 03/10/2022, 9:04 AM

## 2022-03-10 NOTE — Discharge Instructions (Signed)
Hip fracture ORIF POSTOPERATIVE DIRECTIONS   Hip Rehabilitation, Guidelines Following Surgery  The results of a hip operation are greatly improved after range of motion and muscle strengthening exercises. Follow all safety measures which are given to protect your hip. If any of these exercises cause increased pain or swelling in your joint, decrease the amount until you are comfortable again. Then slowly increase the exercises. Call your caregiver if you have problems or questions.   HOME CARE INSTRUCTIONS  Remove items at home which could result in a fall. This includes throw rugs or furniture in walking pathways.  ICE to the affected hip every three hours for 30 minutes at a time and then as needed for pain and swelling.  Continue to use ice on the hip for pain and swelling from surgery. You may notice swelling that will progress down to the foot and ankle.  This is normal after surgery.  Elevate the leg when you are not up walking on it.   Continue to use the breathing machine which will help keep your temperature down.  It is common for your temperature to cycle up and down following surgery, especially at night when you are not up moving around and exerting yourself.  The breathing machine keeps your lungs expanded and your temperature down. Do not place pillow under knee, focus on keeping the knee straight while resting  DIET You may resume your previous home diet once your are discharged from the hospital.  DRESSING / WOUND CARE / SHOWERING Keep dressing clean and dry. Change dressing only as needed. You may shower after your first follow up appointment with [facility].  ACTIVITY Walk with your walker as instructed. Use walker as long as suggested by your caregivers. Avoid periods of inactivity such as sitting longer than an hour when not asleep. This helps prevent blood clots.  You may resume a sexual relationship in one month or when given the OK by your doctor.  You may  return to work once you are cleared by your doctor.  Do not drive a car for 6 weeks or until released by you surgeon.  Do not drive while taking narcotics.  WEIGHT BEARING Weight bearing as tolerated. Use walker/cane as needed for at least 4 weeks post op.  POSTOPERATIVE CONSTIPATION PROTOCOL Constipation - defined medically as fewer than three stools per week and severe constipation as less than one stool per week.  One of the most common issues patients have following surgery is constipation.  Even if you have a regular bowel pattern at home, your normal regimen is likely to be disrupted due to multiple reasons following surgery.  Combination of anesthesia, postoperative narcotics, change in appetite and fluid intake all can affect your bowels.  In order to avoid complications following surgery, here are some recommendations in order to help you during your recovery period.  Colace (docusate) - Pick up an over-the-counter form of Colace or another stool softener and take twice a day as long as you are requiring postoperative pain medications.  Take with a full glass of water daily.  If you experience loose stools or diarrhea, hold the colace until you stool forms back up.  If your symptoms do not get better within 1 week or if they get worse, check with your doctor.  Dulcolax (bisacodyl) - Pick up over-the-counter and take as directed by the product packaging as needed to assist with the movement of your bowels.  Take with a full glass of water.  Use this product as needed if not relieved by Colace only.   MiraLax (polyethylene glycol) - Pick up over-the-counter to have on hand.  MiraLax is a solution that will increase the amount of water in your bowels to assist with bowel movements.  Take as directed and can mix with a glass of water, juice, soda, coffee, or tea.  Take if you go more than two days without a movement. Do not use MiraLax more than once per day. Call your doctor if you are still  constipated or irregular after using this medication for 7 days in a row.  If you continue to have problems with postoperative constipation, please contact the office for further assistance and recommendations.  If you experience "the worst abdominal pain ever" or develop nausea or vomiting, please contact the office immediatly for further recommendations for treatment.  ITCHING  If you experience itching with your medications, try taking only a single pain pill, or even half a pain pill at a time.  You can also use Benadryl over the counter for itching or also to help with sleep.   TED HOSE STOCKINGS Wear the elastic stockings on both legs for six weeks following surgery during the day but you may remove then at night for sleeping.  MEDICATIONS See your medication summary on the "After Visit Summary" that the nursing staff will review with you prior to discharge.  You may have some home medications which will be placed on hold until you complete the course of blood thinner medication.  It is important for you to complete the blood thinner medication as prescribed by your surgeon.  Continue your approved medications as instructed at time of discharge.  PRECAUTIONS If you experience chest pain or shortness of breath - call 911 immediately for transfer to the hospital emergency department.  If you develop a fever greater that 101 F, purulent drainage from wound, increased redness or drainage from wound, foul odor from the wound/dressing, or calf pain - CONTACT YOUR SURGEON.                                                   FOLLOW-UP APPOINTMENTS Make sure you keep all of your appointments after your operation with your surgeon and caregivers. You should call the office at the above phone number and make an appointment for approximately two weeks after the date of your surgery or on the date instructed by your surgeon outlined in the "After Visit Summary".  RANGE OF MOTION AND STRENGTHENING  EXERCISES  These exercises are designed to help you keep full movement of your hip joint. Follow your caregiver's or physical therapist's instructions. Perform all exercises about fifteen times, three times per day or as directed. Exercise both hips, even if you have had only one joint replacement. These exercises can be done on a training (exercise) mat, on the floor, on a table or on a bed. Use whatever works the best and is most comfortable for you. Use music or television while you are exercising so that the exercises are a pleasant break in your day. This will make your life better with the exercises acting as a break in routine you can look forward to.  Lying on your back, slowly slide your foot toward your buttocks, raising your knee up off the floor. Then slowly slide your foot back  down until your leg is straight again.  Lying on your back spread your legs as far apart as you can without causing discomfort.  Lying on your side, raise your upper leg and foot straight up from the floor as far as is comfortable. Slowly lower the leg and repeat.  Lying on your back, tighten up the muscle in the front of your thigh (quadriceps muscles). You can do this by keeping your leg straight and trying to raise your heel off the floor. This helps strengthen the largest muscle supporting your knee.  Lying on your back, tighten up the muscles of your buttocks both with the legs straight and with the knee bent at a comfortable angle while keeping your heel on the floor.   IF YOU ARE TRANSFERRED TO A SKILLED REHAB FACILITY If the patient is transferred to a skilled rehab facility following release from the hospital, a list of the current medications will be sent to the facility for the patient to continue.  When discharged from the skilled rehab facility, please have the facility set up the patient's [place] prior to being released. Also, the skilled facility will be responsible for providing the  patient with their medications at time of release from the facility to include their pain medication, the muscle relaxants, and their blood thinner medication. If the patient is still at the rehab facility at time of the two week follow up appointment, the skilled rehab facility will also need to assist the patient in arranging follow up appointment in our office and any transportation needs.  MAKE SURE YOU:  Understand these instructions.  Get help right away if you are not doing well or get worse.    Pick up stool softner and laxative for home use following surgery while on pain medications. Continue to use ice for pain and swelling after surgery. Do not use any lotions or creams on the incision until instructed by your surgeon.

## 2022-03-11 LAB — BASIC METABOLIC PANEL
Anion gap: 5 (ref 5–15)
BUN: 40 mg/dL — ABNORMAL HIGH (ref 8–23)
CO2: 23 mmol/L (ref 22–32)
Calcium: 8.4 mg/dL — ABNORMAL LOW (ref 8.9–10.3)
Chloride: 113 mmol/L — ABNORMAL HIGH (ref 98–111)
Creatinine, Ser: 1.96 mg/dL — ABNORMAL HIGH (ref 0.44–1.00)
GFR, Estimated: 23 mL/min — ABNORMAL LOW (ref >60)
Glucose, Bld: 137 mg/dL — ABNORMAL HIGH (ref 70–99)
Potassium: 4.1 mmol/L (ref 3.5–5.1)
Sodium: 141 mmol/L (ref 135–145)

## 2022-03-11 LAB — CBC
HCT: 28.3 % — ABNORMAL LOW (ref 36.0–46.0)
Hemoglobin: 9 g/dL — ABNORMAL LOW (ref 12.0–15.0)
MCH: 29 pg (ref 26.0–34.0)
MCHC: 31.8 g/dL (ref 30.0–36.0)
MCV: 91.3 fL (ref 80.0–100.0)
Platelets: 143 10*3/uL — ABNORMAL LOW (ref 150–400)
RBC: 3.1 MIL/uL — ABNORMAL LOW (ref 3.87–5.11)
RDW: 14.6 % (ref 11.5–15.5)
WBC: 8.1 10*3/uL (ref 4.0–10.5)
nRBC: 0 % (ref 0.0–0.2)

## 2022-03-11 LAB — GLUCOSE, CAPILLARY
Glucose-Capillary: 141 mg/dL — ABNORMAL HIGH (ref 70–99)
Glucose-Capillary: 125 mg/dL — ABNORMAL HIGH (ref 70–99)
Glucose-Capillary: 144 mg/dL — ABNORMAL HIGH (ref 70–99)

## 2022-03-11 MED ORDER — BISACODYL 10 MG RE SUPP
10.0000 mg | Freq: Every day | RECTAL | Status: DC | PRN
  Administered 2022-03-11: 10 mg via RECTAL
  Filled 2022-03-11: qty 1

## 2022-03-11 MED ORDER — FLEET ENEMA 7-19 GM/118ML RE ENEM: 1.0000 | ENEMA | Freq: Every day | RECTAL | Status: DC | PRN

## 2022-03-11 NOTE — Progress Notes (Addendum)
PROGRESS NOTE  Destiny Oliver    DOB: 1927-04-04, 88 y.o.  EYC:[id number]    Code Status: DNR   DOA: 03/09/2022   LOS: 2   Brief hospital course  Destiny Oliver is a 88 y.o. female with a PMH significant for diastolic CHF, hypertension, type 2 diabetes mellitus, hypothyroidism, CKD 4, paroxysmal A-fib on Eliquis.  They presented from ALF/ILF to the ED on 03/09/2022 with fall with immediate onset of left hip pain.  Patient had no loss of consciousness.  Reports that she lost her balance falling onto her buttock.  She was previously in her usual state of health.   In the ED, it was found that they had Mild tachycardia of 102 with otherwise normal vitals.  Creatinine at baseline at 1.87 and hemoglobin 11.5.  Labs otherwise unremarkable.  Troponin normal at 15. .  Significant findings included Chest x-ray nonacute and hip x-ray showing left intertrochanteric femur fracture.  They were initially treated with analgesia. Orthosurgery was consulted   Patient was admitted to medicine service for further workup and management of comorbidities as outlined in detail below. 11/8- fixation  03/11/22 -stable, working with PT/OT. Ready for DC to SNF when bed available  Assessment & Plan  Principal Problem:   Intertrochanteric fracture of left femur, closed, initial encounter [facility]) Active Problems:   Paroxysmal atrial fibrillation (HCC)   CKD (chronic kidney disease) stage 4, GFR 15-29 ml/min (HCC)   Chronic diastolic CHF (congestive heart failure) (HCC)   Type 2 diabetes mellitus (HCC)   Hypothyroidism   History of anemia due to CKD   Dementia without behavioral disturbance ([place])   Fall   Left hip pain  Intertrochanteric fracture of left femur, closed, initial encounter ([place]) - ortho managing, appreciate your care - analgesia PRN - PT/OT for disposition planning, TOC engaged  Hypotension- borderline low Bps and remains stable. Patient states that she has high BP at baseline. She denies any  symptoms of hypotension. Likely multifactorial of medication side effect and hypovolemia post op from blood loss. Does not meet transfusion threshold currently. Her TSH was elevated last check in 2021 so will order repeat check to r/o involvement - continue IV fluid maintenance - continue midodrine TID - continue to hold home BP medications - limit opioid medications as able due to contribution to BP decrease   Dementia without behavioral disturbance- at baseline Delirium precautions   Anemia due to CKD and acute blood loss- hgb 11.9>9.5>9.0. gradually decreasing without signs of acute bleeding. Not at transfusion threshold. Will monitor closely as likely contributing to lower BPs - monitor and transfuse as necessary - restart eliquis   Hypothyroidism Continue levothyroxine   Chronic diastolic CHF (congestive heart failure) (HCC) Essential hypertension Stable to low Bps this am Discontinue telmisartan as well as hydralazine Continue metoprolol Hold furosemide   Paroxysmal atrial fibrillation (HCC) Continue metoprolol Hold Eliquis for anemia and can restart tomorrow if stable hgb  Body mass index is 35.74 kg/m.  VTE ppx: apixaban (ELIQUIS) tablet 2.5 mg Start: 03/10/22 1000 SCDs Start: 03/09/22 1744 SCDs Start: 03/09/22 0331 apixaban (ELIQUIS) tablet 2.5 mg   Diet:     Diet   Diet regular Room service appropriate? Yes; Fluid consistency: Thin   Consultants: Ortho surgery Subjective 03/11/22    Pt reports doing well today. No pain at rest. She has no concerns or questions today. Tells me that her blood pressure is normally elevated and never had issues with low BP. She denies any symptoms such  as dizziness, weakness, lightheadedness with standing or chest pain.   Objective   Vitals:   03/10/22 1830 03/10/22 2317 03/10/22 2318 03/11/22 0643  BP: (!) 91/45 (!) 99/55  103/64  Pulse: 99 (!) 103 95 (!) 106  Resp: '18 16  16  '$ Temp: 98.8 F (37.1 C) 98.7 F (37.1 C)     TempSrc: Oral     SpO2: 96% 91% 95% 92%  Weight:      Height:       No intake or output data in the 24 hours ending 03/11/22 0732 Filed Weights   03/09/22 0023 03/09/22 1037 03/09/22 1310  Weight: 89.3 kg 86.2 kg 83 kg     Physical Exam:  General: awake, alert, NAD HEENT: atraumatic, clear conjunctiva, anicteric sclera, MMM, hard of hearing Respiratory: normal respiratory effort. Cardiovascular: quick capillary refill Nervous: A&O x3. no gross focal neurologic deficits, normal speech Extremities: moves all equally, no edema, normal tone Skin: dry, intact, normal temperature, normal color. No rashes, lesions or ulcers on exposed skin Psychiatry: normal mood, congruent affect  Labs   I have personally reviewed the following labs and imaging studies CBC    Component Value Date/Time   WBC 8.1 03/11/2022 0509   RBC 3.10 (L) 03/11/2022 0509   HGB 9.0 (L) 03/11/2022 0509   HGB 12.3 06/02/2012 1812   HCT 28.3 (L) 03/11/2022 0509   HCT 37.7 06/02/2012 1812   PLT 143 (L) 03/11/2022 0509   PLT 244 06/02/2012 1812   MCV 91.3 03/11/2022 0509   MCV 88 06/02/2012 1812   MCH 29.0 03/11/2022 0509   MCHC 31.8 03/11/2022 0509   RDW 14.6 03/11/2022 0509   RDW 15.0 (H) 06/02/2012 1812   LYMPHSABS 1.0 03/19/2020 1450   MONOABS 0.6 03/19/2020 1450   EOSABS 0.1 03/19/2020 1450   BASOSABS 0.0 03/19/2020 1450      Latest Ref Rng & Units 03/11/2022    5:09 AM 03/10/2022    5:34 AM 03/09/2022   12:26 AM  BMP  Glucose 70 - 99 mg/dL 137  126  150   BUN 8 - 23 mg/dL 40  29  36   Creatinine 0.44 - 1.00 mg/dL 1.96  1.62  1.87   Sodium 135 - 145 mmol/L 141  143  142   Potassium 3.5 - 5.1 mmol/L 4.1  3.9  3.7   Chloride 98 - 111 mmol/L 113  115  111   CO2 22 - 32 mmol/L '23  24  21   '$ Calcium 8.9 - 10.3 mg/dL 8.4  8.5  9.4     DG HIP UNILAT WITH PELVIS 2-3 VIEWS LEFT  Result Date: 03/09/2022 CLINICAL DATA:  ORIF left hip fracture. EXAM: DG HIP (WITH OR WITHOUT PELVIS) 2-3V LEFT COMPARISON:   03/09/2022 FINDINGS: Intraoperative imaging with a C-arm demonstrates placement of an intramedullary nail in the proximal left femur with traversing pin extending through the femoral neck and into the femoral head. Alignment appears near anatomic. There is mild displacement a lesser trochanteric fragment. IMPRESSION: Intraoperative imaging during ORIF of proximal left femoral fracture. Alignment appears near anatomic. Electronically Signed   By: Aletta Edouard M.D.   On: 03/09/2022 15:50   DG C-Arm 1-60 Min-No Report  Result Date: 03/09/2022 Fluoroscopy was utilized by the requesting physician.  No radiographic interpretation.    Disposition Plan & Communication  Patient status: Inpatient  Admitted From: ALF Planned disposition location: Skilled nursing facility Anticipated discharge date: 11/13 pending placement  Family Communication: none  Author: Richarda Osmond, DO [facility] 03/11/2022, 7:32 AM   Available by Epic secure chat 7AM-7PM. If 7PM-7AM, please contact night-coverage.  [facility] contact information found on [email or web address].

## 2022-03-11 NOTE — Progress Notes (Signed)
Physical Therapy Treatment Patient Details Name: Destiny Oliver MRN: [id number] DOB: Mar 18, 1927 Today's Date: 03/11/2022   History of Present Illness Pt is 88 y/o female s/p mechanical fall results in L LE IM nailing. Pt with PMH of CHF, HTN,and DM.    PT Comments    Pt was seated in recliner with supportive friend at bedside. Much more lethargic and less talkative this afternoon versus AM session. She also presents with increased LLE swelling + increased pain. Needed more time to perform desired task + increased time to process versus AM session. RN made aware of pain and concerns. Much more assistance required to safely stand and pivot back to EOB. Total assist to reposition into supine position and to reposition to [facility].  MD (Poggi) arrived at conclusion of session. Pt will need extensive assistance to return to PLOF. SNF at DC remains most appropriate.    Recommendations for follow up therapy are one component of a multi-disciplinary discharge planning process, led by the attending physician.  Recommendations may be updated based on patient status, additional functional criteria and insurance authorization.  Follow Up Recommendations  Skilled nursing-short term rehab (<3 hours/day)     Assistance Recommended at Discharge Intermittent Supervision/Assistance  Patient can return home with the following A little help with walking and/or transfers;Help with stairs or ramp for entrance;A little help with bathing/dressing/bathroom;Assist for transportation   Equipment Recommendations  None recommended by PT       Precautions / Restrictions Precautions Precautions: Fall Restrictions Weight Bearing Restrictions: Yes LLE Weight Bearing: Weight bearing as tolerated     Mobility  Bed Mobility Overal bed mobility: Needs Assistance Bed Mobility: Sit to Supine  Supine to sit: Mod assist, HOB elevated Sit to supine: Total assist   General bed mobility comments: pt was unable to initiate  movements to return to bed from short sit EOB. total assist to safely reposition to supine. Very pain limited this afternoon session.    Transfers Overall transfer level: Needs assistance Equipment used: Rolling walker (2 wheels) Transfers: Sit to/from Stand Sit to Stand: Max assist    General transfer comment: Much more assistance to stand this afternoon versus this morning. Author questions if due to increased lethargy + pain. Pt needs alot more time to perform even smallest of task. Per pt's friend, "she has been sleeping alot this afternoon."    Ambulation/Gait Ambulation/Gait assistance: Mod assist Gait Distance (Feet): 3 Feet Assistive device: Rolling walker (2 wheels) Gait Pattern/deviations: Antalgic, Step-to pattern Gait velocity: decreased     General Gait Details: pt unable to actually clear LES form floor this afternoon. Was able to clear floor earlier in the day but currently unable. Author questions if due to more lethargy + more pain. Extremely slow moving this afternoon as well.    Balance Overall balance assessment: Needs assistance Sitting-balance support: Feet supported Sitting balance-Leahy Scale: Good     Standing balance support: During functional activity, Reliant on assistive device for balance, Bilateral upper extremity supported Standing balance-Leahy Scale: Fair Standing balance comment: patient required BUE assist on RW in standing       Cognition Arousal/Alertness: Lethargic Behavior During Therapy: WFL for tasks assessed/performed Overall Cognitive Status: Within Functional Limits for tasks assessed    General Comments: pt was more lethrgic and mcuh less talkative this afternoon versus earlier in the day. She demonstrated much more pain throughout session than earlier as well. Increased time to perform and process all desired task.  Pertinent Vitals/Pain Pain Assessment Pain Assessment: 0-10 Pain Score: 9  Faces Pain Scale:  Hurts whole lot Pain Location: L LE Pain Descriptors / Indicators: Aching, Discomfort Pain Intervention(s): Limited activity within patient's tolerance, Monitored during session, Repositioned, Patient requesting pain meds-RN notified     PT Goals (current goals can now be found in the care plan section) Acute Rehab PT Goals Patient Stated Goal: none stated Progress towards PT goals: Not progressing toward goals - comment (pain limited ths afternoon)    Frequency    BID      PT Plan Current plan remains appropriate    Co-evaluation     PT goals addressed during session: Mobility/safety with mobility;Balance;Proper use of DME;Strengthening/ROM        AM-PAC PT "6 Clicks" Mobility   Outcome Measure  Help needed turning from your back to your side while in a flat bed without using bedrails?: A Lot Help needed moving from lying on your back to sitting on the side of a flat bed without using bedrails?: A Lot Help needed moving to and from a bed to a chair (including a wheelchair)?: A Lot Help needed standing up from a chair using your arms (e.g., wheelchair or bedside chair)?: A Lot Help needed to walk in hospital room?: A Lot Help needed climbing 3-5 steps with a railing? : Total 6 Click Score: 11    End of Session   Activity Tolerance: Patient limited by lethargy;Patient limited by pain Patient left: in bed;with call bell/phone within reach;with bed alarm set;with family/visitor present;with SCD's reapplied Nurse Communication: Mobility status PT Visit Diagnosis: History of falling (Z91.81);Other abnormalities of gait and mobility (R26.89)     Time: 5625-6389 PT Time Calculation (min) (ACUTE ONLY): 26 min  Charges:  $Therapeutic Activity: 23-37 mins                    Julaine Fusi PTA 03/11/22, 3:45 PM

## 2022-03-11 NOTE — Progress Notes (Signed)
Physical Therapy Treatment Patient Details Name: Destiny Oliver MRN: [id number] DOB: 10/21/1926 Today's Date: 03/11/2022   History of Present Illness Pt is 88 y/o female s/p mechanical fall results in L LE IM nailing. Pt with PMH of CHF, HTN,and DM.    PT Comments    Pt was long sitting in bed with RN in room. Extremely pleasant and cooperative throughout. A and O x 3. She does endorse 8/10 pain but was willing to perform all desired task requested of her. Pt needed increased assistance for all mobility, transfers and gait due to increase in pain versus previous date. Rn aware of request for pain medicine In A fib with peak HR hitting 127 bpm in standing. Quickly resolves to 90s once seated. Sao2 stable on rm air. She stood 3 x EOB. Incontinent of urine upon standing. Struggles to take steps with wt bearing on LLE but does put forth great effort. At conclusion of session, pt was in recliner with call bell in reach and RN staff aware of incontinence concerns. Will return this afternoon to assist pt back to bed and issued HEP for strengthening. SNF most appropriate at DC to maximize independence while assisting pt to PLOF.     Recommendations for follow up therapy are one component of a multi-disciplinary discharge planning process, led by the attending physician.  Recommendations may be updated based on patient status, additional functional criteria and insurance authorization.  Follow Up Recommendations  Skilled nursing-short term rehab (<3 hours/day)     Assistance Recommended at Discharge Intermittent Supervision/Assistance  Patient can return home with the following A little help with walking and/or transfers;Help with stairs or ramp for entrance;A little help with bathing/dressing/bathroom;Assist for transportation   Equipment Recommendations  None recommended by PT (pt endorses have RW but unsure if she had BSC)       Precautions / Restrictions Precautions Precautions:  Fall Restrictions Weight Bearing Restrictions: Yes LLE Weight Bearing: Weight bearing as tolerated     Mobility  Bed Mobility Overal bed mobility: Needs Assistance Bed Mobility: Supine to Sit  Supine to sit: Mod assist, HOB elevated  General bed mobility comments: Increased time to perform due to pain. Vcs for sequencing. Mod assist to safely achieve EOB short sit. Pt incontinent of urine each ttransfer. RN made aware    Transfers Overall transfer level: Needs assistance Equipment used: Rolling walker (2 wheels) Transfers: Sit to/from Stand Sit to Stand: Min assist, Mod assist, From elevated surface      General transfer comment: more assistance required to stand today 2/2 to increased pain. Pt remains extremely pleasant. RN made aware of need for pain medicine. Did perform STS EOB x 3 x form elevated bed height    Ambulation/Gait Ambulation/Gait assistance: Min assist, Mod assist Gait Distance (Feet): 3 Feet Assistive device: Rolling walker (2 wheels) Gait Pattern/deviations: Antalgic, Step-to pattern Gait velocity: decreased     General Gait Details: pt struggles to tolerate wt on LLE today. Pain severely limited pt. did clear RLE 2 x but eventually had to just pivot to recliner due to pain.    Balance Overall balance assessment: Needs assistance Sitting-balance support: Feet supported Sitting balance-Leahy Scale: Good     Standing balance support: Reliant on assistive device for balance, Bilateral upper extremity supported, During functional activity Standing balance-Leahy Scale: Fair Standing balance comment: patient required BUE assist on RW in standing       Cognition Arousal/Alertness: Awake/alert Behavior During Therapy: WFL for tasks assessed/performed Overall Cognitive Status:  Within Functional Limits for tasks assessed      General Comments: Pt is A and O x 3. Extremely pleasant and cooperative however more pain limited today           General  Comments General comments (skin integrity, edema, etc.): will issue HEP in PM session. Assited pt with hygiene care and putting in dentures in prep for breakfast      Pertinent Vitals/Pain Pain Assessment Pain Assessment: 0-10 Pain Score: 8  Pain Location: L LE Pain Descriptors / Indicators: Aching, Discomfort Pain Intervention(s): Limited activity within patient's tolerance, Monitored during session     PT Goals (current goals can now be found in the care plan section) Acute Rehab PT Goals Patient Stated Goal: rehab then home Progress towards PT goals: Progressing toward goals    Frequency    BID      PT Plan Current plan remains appropriate    Co-evaluation     PT goals addressed during session: Mobility/safety with mobility;Balance;Proper use of DME;Strengthening/ROM        AM-PAC PT "6 Clicks" Mobility   Outcome Measure  Help needed turning from your back to your side while in a flat bed without using bedrails?: A Little Help needed moving from lying on your back to sitting on the side of a flat bed without using bedrails?: A Lot Help needed moving to and from a bed to a chair (including a wheelchair)?: A Lot Help needed standing up from a chair using your arms (e.g., wheelchair or bedside chair)?: A Lot Help needed to walk in hospital room?: A Lot Help needed climbing 3-5 steps with a railing? : Total 6 Click Score: 12    End of Session Equipment Utilized During Treatment: Gait belt Activity Tolerance: Patient limited by pain Patient left: in chair;with call bell/phone within reach;with chair alarm set;with nursing/sitter in room Nurse Communication: Mobility status PT Visit Diagnosis: History of falling (Z91.81);Other abnormalities of gait and mobility (R26.89)     Time: 9935-7017 PT Time Calculation (min) (ACUTE ONLY): 23 min  Charges:  $Therapeutic Activity: 23-37 mins                    Julaine Fusi PTA 03/11/22, 9:00 AM

## 2022-03-11 NOTE — TOC Progression Note (Signed)
Transition of Care [facility]) - Progression Note    Patient Details  Name: Destiny Oliver MRN: [id number] Date of Birth: 16-May-1926  Transition of Care [facility] CM/SW Contact  Izola Price, RN Phone Number: 03/11/2022, 3:17 PM  Clinical Narrative: 11/11: Spoke with Dorothy Puffer, HCPOA/POA/[employer], [phone]. [facility] is the first choice for STR, which is still pending in the [place]. Reviewed bed offers and Ms Nyoka Cowden wants to wait on [facility] response as this is closest to her to be able to visit/assist with patient. RN CM will reach out to [facility] to see if can get a decision on this patient before other offers. Will follow up with Ms Nyoka Cowden and patient, 03/12/22. Ms. Nyoka Cowden to speak to patient as well. Simmie Davies RN CM       Expected Discharge Plan: Skilled Nursing Facility Barriers to Discharge: SNF Pending bed offer  Expected Discharge Plan and Services Expected Discharge Plan: [place]   Discharge Planning Services: CM Consult   Living arrangements for the past 2 months: Assisted Living Facility                                       Social Determinants of Health (SDOH) Interventions    Readmission Risk Interventions     No data to display

## 2022-03-11 NOTE — Progress Notes (Signed)
Subjective: 2 Days Post-Op Procedure(s) (LRB): INTRAMEDULLARY (IM) NAIL INTERTROCHANTERIC (Left) Patient reports pain as mild.   Patient is well, and has had no acute complaints or problems Denies any CP, SOB, ABD pain. We will continue therapy today. Feels that therapy went well for her yesterday. Plan is to go Skilled nursing facility after hospital stay.  Objective: Vital signs in last 24 hours: Temp:  [98.2 F (36.8 C)-98.8 F (37.1 C)] 98.7 F (37.1 C) (11/10 2317) Pulse Rate:  [92-115] 106 (11/11 0643) Resp:  [16-18] 16 (11/11 0643) BP: (79-103)/(45-64) 103/64 (11/11 0643) SpO2:  [91 %-96 %] 92 % (11/11 0643)  Intake/Output from previous day: No intake/output data recorded. Intake/Output this shift: No intake/output data recorded.  Recent Labs    03/09/22 0026 03/10/22 0534 03/11/22 0509  HGB 11.9* 9.5* 9.0*   Recent Labs    03/10/22 0534 03/11/22 0509  WBC 7.1 8.1  RBC 3.30* 3.10*  HCT 30.1* 28.3*  PLT 158 143*   Recent Labs    03/10/22 0534 03/11/22 0509  NA 143 141  K 3.9 4.1  CL 115* 113*  CO2 24 23  BUN 29* 40*  CREATININE 1.62* 1.96*  GLUCOSE 126* 137*  CALCIUM 8.5* 8.4*   Recent Labs    03/09/22 0026  INR 1.2    EXAM General - Patient is Alert, Appropriate, and Oriented Extremity - Neurovascular intact Sensation intact distally Intact pulses distally Dorsiflexion/Plantar flexion intact Dressing - dressing C/D/I and no drainage Motor Function - intact, moving foot and toes well on exam.  Abdomen soft with intact bowel sounds.  Past Medical History:  Diagnosis Date   Arrhythmia    atrial fibrillation   Breast cancer ([place]) 1994   right breast   CHF (congestive heart failure) (HCC)    CKD (chronic kidney disease)    Diabetes mellitus without complication (HCC)    Hyperlipidemia    Hypertension    Pulmonary HTN (HCC)    Tachycardia    Assessment/Plan:   2 Days Post-Op Procedure(s) (LRB): INTRAMEDULLARY (IM) NAIL  INTERTROCHANTERIC (Left) Principal Problem:   Intertrochanteric fracture of left femur, closed, initial encounter ([place]) Active Problems:   Paroxysmal atrial fibrillation (HCC)   CKD (chronic kidney disease) stage 4, GFR 15-29 ml/min (HCC)   Chronic diastolic CHF (congestive heart failure) (HCC)   Type 2 diabetes mellitus (HCC)   Hypothyroidism   History of anemia due to CKD   Dementia without behavioral disturbance ([place])   Fall   Left hip pain  Estimated body mass index is 35.74 kg/m as calculated from the following:   Height as of this encounter: 5' (1.524 m).   Weight as of this encounter: 83 kg. Advance diet Up with therapy  Pain well controlled VSS Labs stable, Hg 9.0 this morning. Care management to assist with discharge to skilled nursing facility.  Patient resides at [facility] assisted living. Will need to go to a different SNF facility.  Follow-up with [facility] orthopedics in 2 weeks Resume Eliquis TED hose bilateral lower extremity x2 weeks  DVT Prophylaxis -  SCDs  Eliquis Weight-Bearing as tolerated to left leg  J. Cameron Proud, PA-C [facility] 03/11/2022, 7:59 AM

## 2022-03-11 NOTE — Plan of Care (Signed)
  Problem: Coping: Goal: Ability to adjust to condition or change in health will improve Outcome: Progressing   Problem: Fluid Volume: Goal: Ability to maintain a balanced intake and output will improve Outcome: Progressing   Problem: Clinical Measurements: Goal: Cardiovascular complication will be avoided Outcome: Progressing   Problem: Activity: Goal: Risk for activity intolerance will decrease Outcome: Progressing   Problem: Nutrition: Goal: Adequate nutrition will be maintained Outcome: Progressing   Problem: Coping: Goal: Level of anxiety will decrease Outcome: Progressing   Problem: Elimination: Goal: Will not experience complications related to urinary retention Outcome: Progressing   Problem: Pain Managment: Goal: General experience of comfort will improve Outcome: Progressing   Problem: Skin Integrity: Goal: Risk for impaired skin integrity will decrease Outcome: Progressing

## 2022-03-12 DIAGNOSIS — S72142A Displaced intertrochanteric fracture of left femur, initial encounter for closed fracture: Secondary | ICD-10-CM | POA: Diagnosis not present

## 2022-03-12 DIAGNOSIS — W19XXXA Unspecified fall, initial encounter: Secondary | ICD-10-CM | POA: Diagnosis not present

## 2022-03-12 DIAGNOSIS — M25552 Pain in left hip: Secondary | ICD-10-CM | POA: Diagnosis not present

## 2022-03-12 LAB — CBC
HCT: 26.3 % — ABNORMAL LOW (ref 36.0–46.0)
Hemoglobin: 8.4 g/dL — ABNORMAL LOW (ref 12.0–15.0)
MCH: 29 pg (ref 26.0–34.0)
MCHC: 31.9 g/dL (ref 30.0–36.0)
MCV: 90.7 fL (ref 80.0–100.0)
Platelets: 146 10*3/uL — ABNORMAL LOW (ref 150–400)
RBC: 2.9 MIL/uL — ABNORMAL LOW (ref 3.87–5.11)
RDW: 14.4 % (ref 11.5–15.5)
WBC: 8.8 10*3/uL (ref 4.0–10.5)
nRBC: 0 % (ref 0.0–0.2)

## 2022-03-12 LAB — TSH: TSH: 0.374 u[IU]/mL (ref 0.350–4.500)

## 2022-03-12 LAB — BASIC METABOLIC PANEL
Anion gap: 4 — ABNORMAL LOW (ref 5–15)
BUN: 42 mg/dL — ABNORMAL HIGH (ref 8–23)
CO2: 22 mmol/L (ref 22–32)
Calcium: 8.1 mg/dL — ABNORMAL LOW (ref 8.9–10.3)
Chloride: 113 mmol/L — ABNORMAL HIGH (ref 98–111)
Creatinine, Ser: 1.69 mg/dL — ABNORMAL HIGH (ref 0.44–1.00)
GFR, Estimated: 28 mL/min — ABNORMAL LOW (ref >60)
Glucose, Bld: 124 mg/dL — ABNORMAL HIGH (ref 70–99)
Potassium: 4.7 mmol/L (ref 3.5–5.1)
Sodium: 139 mmol/L (ref 135–145)

## 2022-03-12 LAB — GLUCOSE, CAPILLARY: Glucose-Capillary: 164 mg/dL — ABNORMAL HIGH (ref 70–99)

## 2022-03-12 MED ORDER — SENNA 8.6 MG PO TABS
1.0000 | ORAL_TABLET | Freq: Every day | ORAL | Status: DC
  Administered 2022-03-12 – 2022-03-14 (×3): 8.6 mg via ORAL
  Filled 2022-03-12 (×3): qty 1

## 2022-03-12 MED ORDER — LACTULOSE 10 GM/15ML PO SOLN: 20.0000 g | Freq: Every day | ORAL | Status: DC | PRN

## 2022-03-12 MED ORDER — ACETAMINOPHEN 500 MG PO TABS
500.0000 mg | ORAL_TABLET | Freq: Four times a day (QID) | ORAL | Status: AC
  Administered 2022-03-12 – 2022-03-13 (×5): 500 mg via ORAL
  Filled 2022-03-12 (×5): qty 1

## 2022-03-12 NOTE — Progress Notes (Signed)
Physical Therapy Treatment Patient Details Name: Destiny Oliver MRN: [id number] DOB: [birth year] Today's Date: 03/12/2022   History of Present Illness Pt is 88 y/o female s/p mechanical fall results in L LE IM nailing. Pt with PMH of CHF, HTN,and DM.    PT Comments    Pt in chair.  Up with nursing 2 assist.  Participated in exercises as described below.  She is able to stand to walker with mod a x 1 and once up is fairly steady.  She is able to turn to commode to void but does have difficulty with steps to left and does become a bit fearful.  She is able to void and have small BM and care is provided. +2 called for return to recliner but transfer is improved to right and she does better this direction which would be expected.    Recommendations for follow up therapy are one component of a multi-disciplinary discharge planning process, led by the attending physician.  Recommendations may be updated based on patient status, additional functional criteria and insurance authorization.  Follow Up Recommendations  Skilled nursing-short term rehab (<3 hours/day)     Assistance Recommended at Discharge Intermittent Supervision/Assistance  Patient can return home with the following A little help with walking and/or transfers;Help with stairs or ramp for entrance;A little help with bathing/dressing/bathroom;Assist for transportation   Equipment Recommendations  None recommended by PT    Recommendations for Other Services       Precautions / Restrictions Precautions Precautions: Fall Restrictions Weight Bearing Restrictions: Yes LLE Weight Bearing: Weight bearing as tolerated     Mobility  Bed Mobility               General bed mobility comments: pt out of bed in chair upon arrival    Transfers Overall transfer level: Needs assistance Equipment used: Rolling walker (2 wheels) Transfers: Sit to/from Stand, Bed to chair/wheelchair/BSC Sit to Stand: Mod assist   Step pivot  transfers: Mod assist       General transfer comment: does well pushing to stand today with overall less assist.    Ambulation/Gait         Gait velocity: decreased     General Gait Details: to true gait but does take a few steps to chair   Stairs             Wheelchair Mobility    Modified Rankin (Stroke Patients Only)       Balance Overall balance assessment: Needs assistance Sitting-balance support: Feet supported Sitting balance-Leahy Scale: Good     Standing balance support: During functional activity, Reliant on assistive device for balance, Bilateral upper extremity supported Standing balance-Leahy Scale: Fair Standing balance comment: patient required BUE assist on RW in standing                            Cognition Arousal/Alertness: Awake/alert Behavior During Therapy: WFL for tasks assessed/performed Overall Cognitive Status: Within Functional Limits for tasks assessed                                          Exercises Other Exercises Other Exercises: BL AAROM in supine AROM in sitting x 10 Other Exercises: to commode for BM and void    General Comments        Pertinent Vitals/Pain Pain Assessment Pain Assessment: Faces  Faces Pain Scale: Hurts a little bit Pain Location: L LE Pain Descriptors / Indicators: Aching, Discomfort Pain Intervention(s): Limited activity within patient's tolerance, Monitored during session, Repositioned    Home Living                          Prior Function            PT Goals (current goals can now be found in the care plan section) Progress towards PT goals: Progressing toward goals    Frequency    BID      PT Plan Current plan remains appropriate    Co-evaluation              AM-PAC PT "6 Clicks" Mobility   Outcome Measure  Help needed turning from your back to your side while in a flat bed without using bedrails?: A Lot Help needed moving  from lying on your back to sitting on the side of a flat bed without using bedrails?: A Lot Help needed moving to and from a bed to a chair (including a wheelchair)?: A Lot Help needed standing up from a chair using your arms (e.g., wheelchair or bedside chair)?: A Lot Help needed to walk in hospital room?: A Lot Help needed climbing 3-5 steps with a railing? : Total 6 Click Score: 11    End of Session Equipment Utilized During Treatment: Gait belt Activity Tolerance: Patient limited by lethargy;Patient limited by pain Patient left: in chair;with call bell/phone within reach;with chair alarm set;with family/visitor present Nurse Communication: Mobility status PT Visit Diagnosis: History of falling (Z91.81);Other abnormalities of gait and mobility (R26.89)     Time: 6767-2094 PT Time Calculation (min) (ACUTE ONLY): 27 min  Charges:  $Therapeutic Exercise: 8-22 mins $Therapeutic Activity: 8-22 mins                   Chesley Noon, PTA 03/12/22, 12:59 PM

## 2022-03-12 NOTE — Progress Notes (Signed)
Subjective: 3 Days Post-Op Procedure(s) (LRB): INTRAMEDULLARY (IM) NAIL INTERTROCHANTERIC (Left) Patient reports pain as moderate.   Patient is well, and has had no acute complaints or problems Denies any CP, SOB, ABD pain. We will continue therapy today. Feels that therapy went well for her yesterday. Plan is to go Skilled nursing facility after hospital stay. She has not had a BM yet.  Objective: Vital signs in last 24 hours: Temp:  [98.1 F (36.7 C)-99.2 F (37.3 C)] 99.2 F (37.3 C) (11/12 0913) Pulse Rate:  [80-105] 105 (11/12 0913) Resp:  [15-17] 17 (11/12 0913) BP: (93-118)/(53-65) 93/62 (11/12 0913) SpO2:  [90 %-93 %] 91 % (11/12 0913)  Intake/Output from previous day: No intake/output data recorded. Intake/Output this shift: No intake/output data recorded.  Recent Labs    03/10/22 0534 03/11/22 0509 03/12/22 0455  HGB 9.5* 9.0* 8.4*   Recent Labs    03/11/22 0509 03/12/22 0455  WBC 8.1 8.8  RBC 3.10* 2.90*  HCT 28.3* 26.3*  PLT 143* 146*   Recent Labs    03/11/22 0509 03/12/22 0455  NA 141 139  K 4.1 4.7  CL 113* 113*  CO2 23 22  BUN 40* 42*  CREATININE 1.96* 1.69*  GLUCOSE 137* 124*  CALCIUM 8.4* 8.1*   No results for input(s): "LABPT", "INR" in the last 72 hours.   EXAM General - Patient is Alert, Appropriate, and Oriented Extremity - Neurovascular intact Sensation intact distally Intact pulses distally Dorsiflexion/Plantar flexion intact Dressing - dressing C/D/I and no drainage Motor Function - intact, moving foot and toes well on exam.  Abdomen soft with intact bowel sounds.  Past Medical History:  Diagnosis Date   Arrhythmia    atrial fibrillation   Breast cancer ([place]) 1994   right breast   CHF (congestive heart failure) (HCC)    CKD (chronic kidney disease)    Diabetes mellitus without complication (HCC)    Hyperlipidemia    Hypertension    Pulmonary HTN (HCC)    Tachycardia    Assessment/Plan:   3 Days Post-Op  Procedure(s) (LRB): INTRAMEDULLARY (IM) NAIL INTERTROCHANTERIC (Left) Principal Problem:   Intertrochanteric fracture of left femur, closed, initial encounter ([place]) Active Problems:   Paroxysmal atrial fibrillation (HCC)   CKD (chronic kidney disease) stage 4, GFR 15-29 ml/min (HCC)   Chronic diastolic CHF (congestive heart failure) (HCC)   Type 2 diabetes mellitus (HCC)   Hypothyroidism   History of anemia due to CKD   Dementia without behavioral disturbance ([place])   Fall   Left hip pain  Estimated body mass index is 35.74 kg/m as calculated from the following:   Height as of this encounter: 5' (1.524 m).   Weight as of this encounter: 83 kg. Advance diet Up with therapy  Pain well controlled VSS Labs stable, Hg 8.4 Care management to assist with discharge to skilled nursing facility.  Patient resides at [facility] assisted living. Will need to go to a different SNF facility. Proceed with FLEET enema today if unable to have a BM, lactulose added to bowel regimen as well.  Follow-up with [facility] orthopedics in 2 weeks Resume Eliquis TED hose bilateral lower extremity x2 weeks  DVT Prophylaxis -  SCDs  Eliquis Weight-Bearing as tolerated to left leg  J. Cameron Proud, PA-C [place] 03/12/2022, 9:21 AM

## 2022-03-12 NOTE — Plan of Care (Signed)
  Problem: Nutritional: Goal: Maintenance of adequate nutrition will improve Outcome: Progressing   Problem: Clinical Measurements: Goal: Will remain free from infection Outcome: Progressing Goal: Cardiovascular complication will be avoided Outcome: Progressing   Problem: Activity: Goal: Risk for activity intolerance will decrease Outcome: Not Progressing   Problem: Nutrition: Goal: Adequate nutrition will be maintained Outcome: Progressing   Problem: Coping: Goal: Level of anxiety will decrease Outcome: Progressing   Problem: Elimination: Goal: Will not experience complications related to urinary retention Outcome: Progressing   Problem: Activity: Goal: Ability to ambulate and perform ADLs will improve Outcome: Not Progressing   Problem: Pain Management: Goal: Pain level will decrease Outcome: Progressing

## 2022-03-12 NOTE — Progress Notes (Signed)
PROGRESS NOTE  Destiny Oliver    DOB: [birth year], 88 y.o.  UTM:[id number]    Code Status: DNR   DOA: 03/09/2022   LOS: 3   Brief hospital course  Destiny Oliver is a 88 y.o. female with a PMH significant for diastolic CHF, hypertension, type 2 diabetes mellitus, hypothyroidism, CKD 4, paroxysmal A-fib on Eliquis.  They presented from ALF/ILF to the ED on 03/09/2022 with fall with immediate onset of left hip pain.  Patient had no loss of consciousness.  Reports that she lost her balance falling onto her buttock.  She was previously in her usual state of health.   In the ED, it was found that they had Mild tachycardia of 102 with otherwise normal vitals.  Creatinine at baseline at 1.87 and hemoglobin 11.5.  Labs otherwise unremarkable.  Troponin normal at 15. .  Significant findings included Chest x-ray nonacute and hip x-ray showing left intertrochanteric femur fracture.  They were initially treated with analgesia. Orthosurgery was consulted   Patient was admitted to medicine service for further workup and management of comorbidities as outlined in detail below.  11/8- fixation left hip  03/12/22 -stable, working with PT/OT. Ready for DC to SNF when bed available  Assessment & Plan  Principal Problem:   Intertrochanteric fracture of left femur, closed, initial encounter [facility]) Active Problems:   Paroxysmal atrial fibrillation (HCC)   CKD (chronic kidney disease) stage 4, GFR 15-29 ml/min (HCC)   Chronic diastolic CHF (congestive heart failure) (HCC)   Type 2 diabetes mellitus (HCC)   Hypothyroidism   History of anemia due to CKD   Dementia without behavioral disturbance ([place])   Fall   Left hip pain  Intertrochanteric fracture of left femur, closed, initial encounter ([place]) - ortho managing, appreciate your care - analgesia scheduled as patient does not like to bother staff for medications but she is uncomfortable.  - PT/OT for disposition planning, TOC engaged  Hypotension-  borderline low Bps and remains stable. Looks better this am. Patient states that she has high BP at baseline. She denies any symptoms of hypotension. Likely multifactorial of medication side effect and hypovolemia post op from blood loss. Does not meet transfusion threshold currently. Normal TSH.  - holding IV fluid maintenance today for trial - continue midodrine TID - continue to hold home BP medications - limit opioid medications as able due to contribution to BP decrease - consider transfusion   Dementia without behavioral disturbance- at baseline Delirium precautions   Anemia due to CKD and acute blood loss- hgb 11.9>9.5>9.0>8.4. gradually decreasing without signs of acute bleeding. Not at transfusion threshold. Will monitor closely as likely contributing to lower Bps. Consider transfusion - monitor and transfuse as necessary - restart eliquis   Hypothyroidism Continue levothyroxine   Chronic diastolic CHF (congestive heart failure) (HCC) Essential hypertension Stable to low Bps this am Discontinue telmisartan as well as hydralazine Continue metoprolol Hold furosemide   Paroxysmal atrial fibrillation (HCC) Continue metoprolol Continue Eliquis  Body mass index is 35.74 kg/m.  VTE ppx: apixaban (ELIQUIS) tablet 2.5 mg Start: 03/10/22 1000 SCDs Start: 03/09/22 1744 SCDs Start: 03/09/22 0331 apixaban (ELIQUIS) tablet 2.5 mg   Diet:     Diet   Diet regular Room service appropriate? Yes; Fluid consistency: Thin   Consultants: Ortho surgery  Subjective 03/12/22    Pt reports doing well today. Complains of discomfort in her hip but has not had any pain medications. She is anxious to get out of hospital.  Objective   Vitals:   03/11/22 0808 03/11/22 1541 03/11/22 2321 03/12/22 0621  BP: (!) 94/52 93/63 (!) 97/53 118/65  Pulse: (!) 106 98 80 (!) 102  Resp:  '15 16 16  '$ Temp: 99 F (37.2 C) 98.1 F (36.7 C) 98.6 F (37 C) 98.5 F (36.9 C)  TempSrc:  Oral     SpO2: 90% 93% 90% 93%  Weight:      Height:        Intake/Output Summary (Last 24 hours) at 03/12/2022 0830 Last data filed at 03/12/2022 0330 Gross per 24 hour  Intake --  Output 0 ml  Net 0 ml   Filed Weights   03/09/22 0023 03/09/22 1037 03/09/22 1310  Weight: 89.3 kg 86.2 kg 83 kg     Physical Exam:  General: awake, alert, NAD HEENT: atraumatic, clear conjunctiva, anicteric sclera, MMM, hard of hearing Respiratory: normal respiratory effort. Cardiovascular: quick capillary refill Nervous: A&O x3. no gross focal neurologic deficits, normal speech Extremities: moves all equally, no edema, normal tone Skin: dry, intact, normal temperature, normal color. No rashes, lesions or ulcers on exposed skin Psychiatry: normal mood, congruent affect  Labs   I have personally reviewed the following labs and imaging studies CBC    Component Value Date/Time   WBC 8.8 03/12/2022 0455   RBC 2.90 (L) 03/12/2022 0455   HGB 8.4 (L) 03/12/2022 0455   HGB 12.3 06/02/2012 1812   HCT 26.3 (L) 03/12/2022 0455   HCT 37.7 06/02/2012 1812   PLT 146 (L) 03/12/2022 0455   PLT 244 06/02/2012 1812   MCV 90.7 03/12/2022 0455   MCV 88 06/02/2012 1812   MCH 29.0 03/12/2022 0455   MCHC 31.9 03/12/2022 0455   RDW 14.4 03/12/2022 0455   RDW 15.0 (H) 06/02/2012 1812   LYMPHSABS 1.0 03/19/2020 1450   MONOABS 0.6 03/19/2020 1450   EOSABS 0.1 03/19/2020 1450   BASOSABS 0.0 03/19/2020 1450      Latest Ref Rng & Units 03/12/2022    4:55 AM 03/11/2022    5:09 AM 03/10/2022    5:34 AM  BMP  Glucose 70 - 99 mg/dL 124  137  126   BUN 8 - 23 mg/dL 42  40  29   Creatinine 0.44 - 1.00 mg/dL 1.69  1.96  1.62   Sodium 135 - 145 mmol/L 139  141  143   Potassium 3.5 - 5.1 mmol/L 4.7  4.1  3.9   Chloride 98 - 111 mmol/L 113  113  115   CO2 22 - 32 mmol/L '22  23  24   '$ Calcium 8.9 - 10.3 mg/dL 8.1  8.4  8.5     No results found.  Disposition Plan & Communication  Patient status: Inpatient  Admitted  From: ALF Planned disposition location: Skilled nursing facility Anticipated discharge date: 11/13 pending placement  Family Communication: none   Author: Richarda Osmond, DO [facility] 03/12/2022, 8:30 AM   Available by Epic secure chat 7AM-7PM. If 7PM-7AM, please contact night-coverage.  TRH contact information found on [email or web address].

## 2022-03-12 NOTE — TOC Progression Note (Signed)
Transition of Care [facility]) - Progression Note    Patient Details  Name: Destiny Oliver MRN: [id number] Date of Birth: [birth year]  Transition of Care [facility]) CM/SW Contact  Izola Price, RN Phone Number: 03/12/2022, 5:16 PM  Clinical Narrative:  11/12: Updated [place] Agent that [facility] remains pending likely due to weekend. TOC to follow up Monday. Ms [name] may have a contact at TL if nothing comes through. Simmie Davies RN.      Expected Discharge Plan: Skilled Nursing Facility Barriers to Discharge: SNF Pending bed offer  Expected Discharge Plan and Services Expected Discharge Plan: [place]   Discharge Planning Services: CM Consult   Living arrangements for the past 2 months: Assisted Living Facility                                       Social Determinants of Health (SDOH) Interventions    Readmission Risk Interventions     No data to display

## 2022-03-13 DIAGNOSIS — S72142A Displaced intertrochanteric fracture of left femur, initial encounter for closed fracture: Secondary | ICD-10-CM | POA: Diagnosis not present

## 2022-03-13 DIAGNOSIS — I9581 Postprocedural hypotension: Secondary | ICD-10-CM | POA: Diagnosis not present

## 2022-03-13 DIAGNOSIS — D62 Acute posthemorrhagic anemia: Secondary | ICD-10-CM

## 2022-03-13 DIAGNOSIS — M25552 Pain in left hip: Secondary | ICD-10-CM | POA: Diagnosis not present

## 2022-03-13 DIAGNOSIS — W19XXXA Unspecified fall, initial encounter: Secondary | ICD-10-CM | POA: Diagnosis not present

## 2022-03-13 LAB — CBC
HCT: 24.6 % — ABNORMAL LOW (ref 36.0–46.0)
HCT: 29.4 % — ABNORMAL LOW (ref 36.0–46.0)
Hemoglobin: 7.8 g/dL — ABNORMAL LOW (ref 12.0–15.0)
Hemoglobin: 9.6 g/dL — ABNORMAL LOW (ref 12.0–15.0)
MCH: 28.7 pg (ref 26.0–34.0)
MCH: 29.2 pg (ref 26.0–34.0)
MCHC: 31.7 g/dL (ref 30.0–36.0)
MCHC: 32.7 g/dL (ref 30.0–36.0)
MCV: 90.4 fL (ref 80.0–100.0)
MCV: 89.4 fL (ref 80.0–100.0)
Platelets: 171 10*3/uL (ref 150–400)
Platelets: 180 10*3/uL (ref 150–400)
RBC: 2.72 MIL/uL — ABNORMAL LOW (ref 3.87–5.11)
RBC: 3.29 MIL/uL — ABNORMAL LOW (ref 3.87–5.11)
RDW: 14.6 % (ref 11.5–15.5)
RDW: 14.8 % (ref 11.5–15.5)
WBC: 6.8 10*3/uL (ref 4.0–10.5)
WBC: 7.4 10*3/uL (ref 4.0–10.5)
nRBC: 0 % (ref 0.0–0.2)
nRBC: 0.4 % — ABNORMAL HIGH (ref 0.0–0.2)

## 2022-03-13 LAB — PREPARE RBC (CROSSMATCH)

## 2022-03-13 MED ORDER — SODIUM CHLORIDE 0.9% IV SOLUTION: Freq: Once | INTRAVENOUS | Status: AC

## 2022-03-13 NOTE — Progress Notes (Signed)
   Subjective: 4 Days Post-Op Procedure(s) (LRB): INTRAMEDULLARY (IM) NAIL INTERTROCHANTERIC (Left) Patient reports pain as mild.   Patient is well, and has had no acute complaints or problems Denies any CP, SOB, ABD pain. We will continue therapy today.  Plan is to go Skilled nursing facility after hospital stay.  Objective: Vital signs in last 24 hours: Temp:  [98 F (36.7 C)-99.5 F (37.5 C)] 98.3 F (36.8 C) (11/13 1057) Pulse Rate:  [40-98] 95 (11/13 1057) Resp:  [16-18] 16 (11/13 1057) BP: (102-115)/(57-80) 115/57 (11/13 1057) SpO2:  [92 %-96 %] 96 % (11/13 1057)  Intake/Output from previous day: 11/12 0701 - 11/13 0700 In: -  Out: 500 [Urine:500] Intake/Output this shift: No intake/output data recorded.  Recent Labs    03/11/22 0509 03/12/22 0455 03/13/22 0506  HGB 9.0* 8.4* 7.8*   Recent Labs    03/12/22 0455 03/13/22 0506  WBC 8.8 6.8  RBC 2.90* 2.72*  HCT 26.3* 24.6*  PLT 146* 171   Recent Labs    03/11/22 0509 03/12/22 0455  NA 141 139  K 4.1 4.7  CL 113* 113*  CO2 23 22  BUN 40* 42*  CREATININE 1.96* 1.69*  GLUCOSE 137* 124*  CALCIUM 8.4* 8.1*   No results for input(s): "LABPT", "INR" in the last 72 hours.   EXAM General - Patient is Alert, Appropriate, and Oriented Extremity - Neurovascular intact Sensation intact distally Intact pulses distally Dorsiflexion/Plantar flexion intact Dressing - dressing C/D/I and no drainage Motor Function - intact, moving foot and toes well on exam.   Past Medical History:  Diagnosis Date   Arrhythmia    atrial fibrillation   Breast cancer ([place]) 1994   right breast   CHF (congestive heart failure) (HCC)    CKD (chronic kidney disease)    Diabetes mellitus without complication (HCC)    Hyperlipidemia    Hypertension    Pulmonary HTN (HCC)    Tachycardia     Assessment/Plan:   4 Days Post-Op Procedure(s) (LRB): INTRAMEDULLARY (IM) NAIL INTERTROCHANTERIC (Left) Principal Problem:    Intertrochanteric fracture of left femur, closed, initial encounter ([place]) Active Problems:   Paroxysmal atrial fibrillation (HCC)   CKD (chronic kidney disease) stage 4, GFR 15-29 ml/min (HCC)   Chronic diastolic CHF (congestive heart failure) (HCC)   Type 2 diabetes mellitus (HCC)   Hypothyroidism   History of anemia due to CKD   Dementia without behavioral disturbance ([place])   Fall   Left hip pain  Estimated body mass index is 35.74 kg/m as calculated from the following:   Height as of this encounter: 5' (1.524 m).   Weight as of this encounter: 83 kg. Advance diet Up with therapy Pain well controlled VSS Labs stable. Hgb 7.8, stable Care management to assist with discharge to skilled nursing facility/[place] today.  Follow-up with [facility] orthopedics in 2 weeks post op Resume Eliquis TED hose bilateral lower extremity x2 weeks    DVT Prophylaxis -  SCDs  Eliquis Weight-Bearing as tolerated to left leg   T. Rachelle Hora, PA-C [place] 03/13/2022, 11:13 AM

## 2022-03-13 NOTE — TOC Progression Note (Addendum)
Transition of Care [facility]) - Progression Note    Patient Details  Name: Destiny Oliver MRN: [id number] Date of Birth: June 22, 1926  Transition of Care [facility]) CM/SW [place], RN Phone Number: 03/13/2022, 9:26 AM  Clinical Narrative:   Reached out to Seth Bake at [facility] to see if they are able to accept the patient, awaiting a response.     Expected Discharge Plan: Skilled Nursing Facility Barriers to Discharge: SNF Pending bed offer  Expected Discharge Plan and Services Expected Discharge Plan: [place]   Discharge Planning Services: CM Consult   Living arrangements for the past 2 months: Assisted Living Facility                                       Social Determinants of Health (SDOH) Interventions    Readmission Risk Interventions     No data to display

## 2022-03-13 NOTE — TOC Progression Note (Signed)
Transition of Care [facility]) - Progression Note    Patient Details  Name: Destiny Oliver MRN: [id number] Date of Birth: 14-Apr-1927  Transition of Care [facility]) CM/SW [place], RN Phone Number: 03/13/2022, 2:00 PM  Clinical Narrative:    Met with the patient and family in the room, reviewed the bed options, They chose [facility], I notified Radiation protection practitioner   Expected Discharge Plan: [place] Barriers to Discharge: SNF Pending bed offer  Expected Discharge Plan and Services Expected Discharge Plan: [place]   Discharge Planning Services: CM Consult   Living arrangements for the past 2 months: Assisted Living Facility                                       Social Determinants of Health (SDOH) Interventions    Readmission Risk Interventions     No data to display

## 2022-03-13 NOTE — Progress Notes (Signed)
PROGRESS NOTE  Destiny Oliver    DOB: 09/07/1926, 88 y.o.  GXQ:[id number]    Code Status: DNR   DOA: 03/09/2022   LOS: 4   Brief hospital course  Destiny Oliver is a 88 y.o. female with a PMH significant for diastolic CHF, hypertension, type 2 diabetes mellitus, hypothyroidism, CKD 4, paroxysmal A-fib on Eliquis.  They presented from ALF/ILF to the ED on 03/09/2022 with fall with immediate onset of left hip pain.  Patient had no loss of consciousness.  Reports that she lost her balance falling onto her buttock.  She was previously in her usual state of health.   In the ED, it was found that they had Mild tachycardia of 102 with otherwise normal vitals.  Creatinine at baseline at 1.87 and hemoglobin 11.5.  Labs otherwise unremarkable.  Troponin normal at 15. .  Significant findings included Chest x-ray nonacute and hip x-ray showing left intertrochanteric femur fracture.  They were initially treated with analgesia. Orthosurgery was consulted   Patient was admitted to medicine service for further workup and management of comorbidities as outlined in detail below.  11/8- fixation left hip  03/13/22 -stable, working with PT/OT. Ready for DC to SNF when bed available  Assessment & Plan  Principal Problem:   Intertrochanteric fracture of left femur, closed, initial encounter [facility]) Active Problems:   Paroxysmal atrial fibrillation (HCC)   CKD (chronic kidney disease) stage 4, GFR 15-29 ml/min (HCC)   Chronic diastolic CHF (congestive heart failure) (HCC)   Type 2 diabetes mellitus (HCC)   Hypothyroidism   History of anemia due to CKD   Dementia without behavioral disturbance ([place])   Fall   Left hip pain  Intertrochanteric fracture of left femur, closed, initial encounter ([place]) - ortho managing, appreciate your care - analgesia scheduled as patient does not like to bother staff for medications but she is uncomfortable.  - PT/OT for disposition planning, TOC engaged  Hypotension-  borderline low Bps and remains stable. Looks better this am. Patient states that she has high BP at baseline. She denies any symptoms of hypotension. Likely multifactorial of medication side effect and hypovolemia post op from blood loss. Normal TSH.  - transfuse this am 1 unit pRBCs  - post CBC - holding IV fluid maintenance today for trial - continue midodrine TID - continue to hold home BP medications - limit opioid medications as able due to contribution to BP decrease - consider transfusion   Dementia without behavioral disturbance- at baseline Delirium precautions   Anemia due to CKD and acute blood loss- hgb 11.9>9.5>9.0>8.4>7.8. gradually decreasing without signs of acute bleeding. Continues to drop and continues to have low Bps and gets tachycardic with minor movement so will be transfusing today. Pt is agreeable - 1 unit pRBCs  - post CBC - CBC am - monitor and transfuse as necessary - restart eliquis   Hypothyroidism Continue levothyroxine   Chronic diastolic CHF (congestive heart failure) (HCC) Essential hypertension Stable to low Bps this am Discontinue telmisartan as well as hydralazine Continue metoprolol Hold furosemide   Paroxysmal atrial fibrillation (HCC) Continue metoprolol Continue Eliquis  Body mass index is 35.74 kg/m.  VTE ppx: apixaban (ELIQUIS) tablet 2.5 mg Start: 03/10/22 1000 SCDs Start: 03/09/22 1744 SCDs Start: 03/09/22 0331 apixaban (ELIQUIS) tablet 2.5 mg   Diet:     Diet   Diet regular Room service appropriate? Yes; Fluid consistency: Thin   Consultants: Ortho surgery  Subjective 03/13/22    Pt reports doing well  today. Her hip pain is improved today. She is agreeable to receiving blood. All questions and concerns of patient and her friend addressed at time of encounter.    Objective   Vitals:   03/12/22 1519 03/13/22 0018 03/13/22 0631 03/13/22 0736  BP: 115/63 115/80 106/65 102/75  Pulse: 98 (!) 40 89 65  Resp: '17 16 18    '$ Temp: 99.5 F (37.5 C) 98 F (36.7 C)  98.2 F (36.8 C)  TempSrc:      SpO2: 96% 94% 95% 92%  Weight:      Height:        Intake/Output Summary (Last 24 hours) at 03/13/2022 0810 Last data filed at 03/13/2022 0516 Gross per 24 hour  Intake --  Output 500 ml  Net -500 ml    Filed Weights   03/09/22 0023 03/09/22 1037 03/09/22 1310  Weight: 89.3 kg 86.2 kg 83 kg     Physical Exam:  General: awake, alert, NAD HEENT: atraumatic, clear conjunctiva, anicteric sclera, MMM, hard of hearing Respiratory: normal respiratory effort. Cardiovascular: quick capillary refill Nervous: A&O x3. no gross focal neurologic deficits, normal speech Extremities: moves all equally, no edema, normal tone Skin: dry, intact, normal temperature, normal color. No rashes, lesions or ulcers on exposed skin Psychiatry: normal mood, congruent affect  Labs   I have personally reviewed the following labs and imaging studies CBC    Component Value Date/Time   WBC 6.8 03/13/2022 0506   RBC 2.72 (L) 03/13/2022 0506   HGB 7.8 (L) 03/13/2022 0506   HGB 12.3 06/02/2012 1812   HCT 24.6 (L) 03/13/2022 0506   HCT 37.7 06/02/2012 1812   PLT 171 03/13/2022 0506   PLT 244 06/02/2012 1812   MCV 90.4 03/13/2022 0506   MCV 88 06/02/2012 1812   MCH 28.7 03/13/2022 0506   MCHC 31.7 03/13/2022 0506   RDW 14.6 03/13/2022 0506   RDW 15.0 (H) 06/02/2012 1812   LYMPHSABS 1.0 03/19/2020 1450   MONOABS 0.6 03/19/2020 1450   EOSABS 0.1 03/19/2020 1450   BASOSABS 0.0 03/19/2020 1450      Latest Ref Rng & Units 03/12/2022    4:55 AM 03/11/2022    5:09 AM 03/10/2022    5:34 AM  BMP  Glucose 70 - 99 mg/dL 124  137  126   BUN 8 - 23 mg/dL 42  40  29   Creatinine 0.44 - 1.00 mg/dL 1.69  1.96  1.62   Sodium 135 - 145 mmol/L 139  141  143   Potassium 3.5 - 5.1 mmol/L 4.7  4.1  3.9   Chloride 98 - 111 mmol/L 113  113  115   CO2 22 - 32 mmol/L '22  23  24   '$ Calcium 8.9 - 10.3 mg/dL 8.1  8.4  8.5     No results  found.  Disposition Plan & Communication  Patient status: Inpatient  Admitted From: ALF Planned disposition location: Skilled nursing facility Anticipated discharge date: 11/14 pending placement  Family Communication: friend at bedside   Author: Richarda Osmond, DO [facility] 03/13/2022, 8:10 AM   Available by Epic secure chat 7AM-7PM. If 7PM-7AM, please contact night-coverage.  TRH contact information found on [email or web address].

## 2022-03-13 NOTE — Progress Notes (Addendum)
Physical Therapy Treatment Patient Details Name: Destiny Oliver MRN: [id number] DOB: 10-30-1926 Today's Date: 03/13/2022   History of Present Illness Pt is 88 y/o female s/p mechanical fall results in L LE IM nailing. Pt with PMH of CHF, HTN,and DM.    PT Comments    Ready to get up.  To EOB with good effort and increased time but does need mod a x 1 to get fully to edge.  Steady in sitting.  She is able to stand with min a x 2 and turn to right to [facility] for voiding and large BM.  After care, she is able to stand and turn 180 degrees to left to get to recliner. She does need physical assist at times to move LLE and increased time to perform.  Continues with heavy forward lean on walker and recommend +2 with nursing staff for pt safety.  Discussed with tech.  HR does increased briefly at times to 140's with mobility and exertion but does return to baseline with rest.  Discussed with primary MD.    Recommendations for follow up therapy are one component of a multi-disciplinary discharge planning process, led by the attending physician.  Recommendations may be updated based on patient status, additional functional criteria and insurance authorization.  Follow Up Recommendations  Skilled nursing-short term rehab (<3 hours/day)     Assistance Recommended at Discharge Intermittent Supervision/Assistance  Patient can return home with the following A little help with walking and/or transfers;Help with stairs or ramp for entrance;A little help with bathing/dressing/bathroom;Assist for transportation   Equipment Recommendations  None recommended by PT    Recommendations for Other Services       Precautions / Restrictions Precautions Precautions: Fall Restrictions Weight Bearing Restrictions: Yes LLE Weight Bearing: Weight bearing as tolerated     Mobility  Bed Mobility Overal bed mobility: Needs Assistance Bed Mobility: Supine to Sit     Supine to sit: Mod assist, HOB elevated      General bed mobility comments: good effort for task    Transfers Overall transfer level: Needs assistance Equipment used: Rolling walker (2 wheels) Transfers: Sit to/from Stand, Bed to chair/wheelchair/BSC Sit to Stand: Min assist, +2 physical assistance   Step pivot transfers: Min assist, +2 physical assistance       General transfer comment: heavy forward lean on walker and difficulty taking steps.  occasional physical assist to move LLE but progressing.    Ambulation/Gait Ambulation/Gait assistance: Min assist, +2 physical assistance Gait Distance (Feet): 4 Feet Assistive device: Rolling walker (2 wheels) Gait Pattern/deviations: Step-to pattern, Decreased step length - right, Decreased step length - left Gait velocity: decreased     General Gait Details: able to turn 90 degrees to commode then 180 degrees to recliner.   Stairs             Wheelchair Mobility    Modified Rankin (Stroke Patients Only)       Balance Overall balance assessment: Needs assistance Sitting-balance support: Feet supported Sitting balance-Leahy Scale: Good     Standing balance support: During functional activity, Reliant on assistive device for balance, Bilateral upper extremity supported Standing balance-Leahy Scale: Fair Standing balance comment: patient required BUE assist on RW in standing, heavy forward lean                            Cognition Arousal/Alertness: Awake/alert Behavior During Therapy: WFL for tasks assessed/performed Overall Cognitive Status: Within Functional Limits for  tasks assessed                                          Exercises Other Exercises Other Exercises: large BM    General Comments        Pertinent Vitals/Pain Pain Assessment Pain Assessment: Faces Faces Pain Scale: Hurts a little bit Pain Location: L LE Pain Descriptors / Indicators: Aching, Discomfort Pain Intervention(s): Limited activity within  patient's tolerance, Monitored during session, Repositioned    Home Living                          Prior Function            PT Goals (current goals can now be found in the care plan section) Progress towards PT goals: Progressing toward goals    Frequency    BID      PT Plan Current plan remains appropriate    Co-evaluation              AM-PAC PT "6 Clicks" Mobility   Outcome Measure  Help needed turning from your back to your side while in a flat bed without using bedrails?: A Lot Help needed moving from lying on your back to sitting on the side of a flat bed without using bedrails?: A Lot Help needed moving to and from a bed to a chair (including a wheelchair)?: A Lot Help needed standing up from a chair using your arms (e.g., wheelchair or bedside chair)?: A Lot Help needed to walk in hospital room?: A Lot Help needed climbing 3-5 steps with a railing? : Total 6 Click Score: 11    End of Session Equipment Utilized During Treatment: Gait belt Activity Tolerance: Patient limited by lethargy;Patient limited by pain Patient left: in chair;with call bell/phone within reach;with chair alarm set;with family/visitor present Nurse Communication: Mobility status PT Visit Diagnosis: History of falling (Z91.81);Other abnormalities of gait and mobility (R26.89)     Time: 9509-3267 PT Time Calculation (min) (ACUTE ONLY): 25 min  Charges:  $Therapeutic Activity: 23-37 mins                   Chesley Noon, PTA 03/13/22, 9:57 AM

## 2022-03-13 NOTE — Anesthesia Postprocedure Evaluation (Signed)
Anesthesia Post Note  Patient: Destiny Oliver  Procedure(s) Performed: INTRAMEDULLARY (IM) NAIL INTERTROCHANTERIC (Left: Hip)  Patient location during evaluation: PACU Anesthesia Type: General Level of consciousness: awake and alert Pain management: pain level controlled Vital Signs Assessment: post-procedure vital signs reviewed and stable Respiratory status: spontaneous breathing, nonlabored ventilation and respiratory function stable Cardiovascular status: blood pressure returned to baseline and stable Postop Assessment: no apparent nausea or vomiting Anesthetic complications: no   No notable events documented.   Last Vitals:  Vitals:   03/13/22 1438 03/13/22 1659  BP: (!) 153/87 127/87  Pulse: 85 95  Resp: 18   Temp: 36.5 C (!) 36.4 C  SpO2: 96% 93%    Last Pain:  Vitals:   03/13/22 1542  TempSrc:   PainSc: [place]

## 2022-03-13 NOTE — Discharge Summary (Signed)
Physician Discharge Summary  Patient: Destiny Oliver TGG:[id number] DOB: [birth year]   Code Status: DNR Admit date: 03/09/2022 Discharge date: 03/14/2022 Disposition: Skilled nursing facility, PT, OT, nurse aid, and RN PCP: System, Provider Not In  Recommendations for Outpatient Follow-up:  Follow up with PCP within 1-2 weeks Regarding general hospital follow up.  Recommend CBC, metabolic panel. Pt had 1 unit pRBCs while admitted Follow up with [facility] ortho surgery 2 weeks Regarding post-op healing of left hip fracture Recommended ted hose bilaterally x 2 weeks  Discharge Diagnoses:  Principal Problem:   Intertrochanteric fracture of left femur, closed, initial encounter [facility]) Active Problems:   Paroxysmal atrial fibrillation (HCC)   CKD (chronic kidney disease) stage 4, GFR 15-29 ml/min (HCC)   Chronic diastolic CHF (congestive heart failure) (HCC)   Type 2 diabetes mellitus ([place])   Hypothyroidism   History of anemia due to CKD   Dementia without behavioral disturbance ([place])   Fall   Left hip pain   Postprocedural hypotension   Acute postoperative anemia due to expected blood loss  Brief Hospital Course Summary: Destiny Oliver is a 88 y.o. female with a PMH significant for diastolic CHF, hypertension, type 2 diabetes mellitus, hypothyroidism, CKD 4, paroxysmal A-fib on Eliquis.   They presented from ALF/ILF to the ED on 03/09/2022 with fall with immediate onset of left hip pain.  Patient had no loss of consciousness. Reports that she lost her balance falling onto her buttock. She was previously in her usual state of health.    In the ED, it was found that they had Mild tachycardia of 102 with otherwise normal vitals. Creatinine at baseline at 1.87 and hemoglobin 11.5. Labs otherwise unremarkable. Significant findings included Chest x-ray nonacute and hip x-ray showing left intertrochanteric femur fracture.   They were initially treated with analgesia and Ortho surgery was  consulted. 11/8- fixation left hip. 11/9- time of discharge, patient had normal post-op healing of her hip fracture and worked with PT/OT who recommended SNF at dc.    Of note, they had asymptomatic hypotension post-op so all her home BP medications were held and she was started on midodrine which stabilized her BP in low/low-normal range. Contributing to the hypotension was post-op anemia. Hgb gradually declined to 7.8 on 11/13 and she received 1 unit pRBCs transfusion. Her hgb improved to 9.6 and also helped to stabilize her BP. Which was from 70-350K systolics 93-81W diastolics prior to discharge. She remained off her BP medications and on the midodrine and was asymptomatic at time of discharge.    All other chronic conditions were treated with home medications.   Discharge Condition: Good, improved Recommended discharge diet: Regular healthy diet  Consultations: Ortho surgery   Procedures/Studies: Left hip fracture fixation 11/8   Allergies as of 03/14/2022       Reactions   Penicillins Swelling        Medication List     STOP taking these medications    ciprofloxacin 500 MG tablet Commonly known as: CIPRO   docusate sodium 100 MG capsule Commonly known as: COLACE   hydrALAZINE 50 MG tablet Commonly known as: APRESOLINE   potassium chloride SA 20 MEQ tablet Commonly known as: KLOR-CON M   senna 8.6 MG Tabs tablet Commonly known as: SENOKOT   telmisartan 40 MG tablet Commonly known as: MICARDIS   torsemide 20 MG tablet Commonly known as: DEMADEX       TAKE these medications    acetaminophen 325 MG tablet  Commonly known as: TYLENOL Take 1-2 tablets (325-650 mg total) by mouth every 6 (six) hours as needed for mild pain (pain score 1-3 or temp > 100.5).   albuterol 108 (90 Base) MCG/ACT inhaler Commonly known as: VENTOLIN HFA Inhale 2 puffs into the lungs every 6 (six) hours as needed for wheezing or shortness of breath.   apixaban 2.5 MG Tabs  tablet Commonly known as: ELIQUIS Take 1 tablet (2.5 mg total) by mouth 2 (two) times daily.   CeraVe Crea Apply 1 Application topically as directed. (Apply after washing)   ferrous sulfate 325 (65 FE) MG tablet Take 325 mg by mouth in the morning.   Healthy Eyes Supervision 2 Caps Take 1 capsule by mouth 2 (two) times daily.   HYDROcodone-acetaminophen 5-325 MG tablet Commonly known as: NORCO/VICODIN Take 1-2 tablets by mouth every 4 (four) hours as needed for moderate pain (pain score 4-6).   levothyroxine 75 MCG tablet Commonly known as: SYNTHROID Take 75 mcg by mouth daily.   metoprolol succinate 50 MG 24 hr tablet Commonly known as: TOPROL-XL Take 50 mg by mouth in the morning.   midodrine 10 MG tablet Commonly known as: PROAMATINE Take 1 tablet (10 mg total) by mouth 3 (three) times daily with meals.   neomycin-polymyxin b-dexamethasone 3.5-10000-0.1 Oint Commonly known as: MAXITROL Place 1 Application into both eyes at bedtime.   polyethylene glycol 17 g packet Commonly known as: MIRALAX / GLYCOLAX Take 17 g by mouth daily.   sodium phosphate 7-19 GM/118ML Enem Place 133 mLs (1 enema total) rectally daily as needed for severe constipation.   Theratears 0.25 % Soln Generic drug: Carboxymethylcellulose Sodium Place 1 drop into both eyes every 2 (two) hours while awake.   Vitamin D 50 MCG (2000 UT) tablet Take 2,000 Units by mouth daily.        Contact information for follow-up providers     Duanne Guess, PA-C Follow up.   Specialties: Orthopedic Surgery, Emergency Medicine Contact information: [place] [address] [phone]              Contact information for after-discharge care     [facility] .   Service: Skilled Nursing Contact information: [address]  [place] [phone]                     Subjective   Pt reports feeling well. Her hip pain is well controlled with tylenol. She is anxious to get back to her home.   Objective  Blood pressure 102/75, pulse 65, temperature 98.2 F (36.8 C), resp. rate 18, height 5' (1.524 m), weight 83 kg, SpO2 92 %.   General: Pt is alert, awake, not in acute distress Cardiovascular: RRR, S1/S2 +, no rubs, no gallops Respiratory: CTA bilaterally, no wheezing, no rhonchi Abdominal: Soft, NT, ND, bowel sounds + Extremities: no edema, no cyanosis. Mild tenderness to palpation of surgical site. Distal motor, sensation, circulation intact.   The results of significant diagnostics from this hospitalization (including imaging, microbiology, ancillary and laboratory) are listed below for reference.   Imaging studies: DG HIP UNILAT WITH PELVIS 2-3 VIEWS LEFT  Result Date: 03/09/2022 CLINICAL DATA:  ORIF left hip fracture. EXAM: DG HIP (WITH OR WITHOUT PELVIS) 2-3V LEFT COMPARISON:  03/09/2022 FINDINGS: Intraoperative imaging with a C-arm demonstrates placement of an intramedullary nail in the proximal  left femur with traversing pin extending through the femoral neck and into the femoral head. Alignment appears near anatomic. There is mild displacement a lesser trochanteric fragment. IMPRESSION: Intraoperative imaging during ORIF of proximal left femoral fracture. Alignment appears near anatomic. Electronically Signed   By: Aletta Edouard M.D.   On: 03/09/2022 15:50   DG C-Arm 1-60 Min-No Report  Result Date: 03/09/2022 Fluoroscopy was utilized by the requesting physician.  No radiographic interpretation.   DG FEMUR MIN 2 VIEWS LEFT  Result Date: 03/09/2022 CLINICAL DATA:  Left hip fracture EXAM: LEFT FEMUR 2 VIEWS COMPARISON:  Left hip radiographs dated 03/09/2022 at 0104 hours FINDINGS: Intertrochanteric left hip fracture. Mild varus angulation. Displaced lesser trochanteric fragment. Left knee  arthroplasty, without complication. IMPRESSION: Intertrochanteric left hip fracture, as above. Electronically Signed   By: Julian Hy M.D.   On: 03/09/2022 03:35   DG Chest 1 View  Result Date: 03/09/2022 CLINICAL DATA:  Preop EXAM: CHEST  1 VIEW COMPARISON:  03/31/2021 FINDINGS: Increased interstitial markings. No focal consolidation. No pleural effusion or pneumothorax. Mild cardiomegaly. Surgical clips along the right chest wall/axilla. IMPRESSION: No evidence of acute cardiopulmonary disease. Mild cardiomegaly. Electronically Signed   By: Julian Hy M.D.   On: 03/09/2022 01:40   DG Hip Unilat With Pelvis 2-3 Views Left  Result Date: 03/09/2022 CLINICAL DATA:  Left hip pain after fall EXAM: DG HIP (WITH OR WITHOUT PELVIS) 2-3V LEFT COMPARISON:  Radiographs 04/18/2018 FINDINGS: Acute comminuted displaced left intertrochanteric fracture. Lateral displacement of the distal femoral shaft and medial displacement of the lesser trochanter. The left femoral head remains located in the acetabulum. No additional fractures. IMPRESSION: Left intertrochanteric femur fracture. Electronically Signed   By: Placido Sou M.D.   On: 03/09/2022 01:40    Labs: Basic Metabolic Panel: Recent Labs  Lab 03/09/22 0026 03/10/22 0534 03/11/22 0509 03/12/22 0455  NA 142 143 141 139  K 3.7 3.9 4.1 4.7  CL 111 115* 113* 113*  CO2 21* '24 23 22  '$ GLUCOSE 150* 126* 137* 124*  BUN 36* 29* 40* 42*  CREATININE 1.87* 1.62* 1.96* 1.69*  CALCIUM 9.4 8.5* 8.4* 8.1*   CBC: Recent Labs  Lab 03/11/22 0509 03/12/22 0455 03/13/22 0506 03/13/22 1546 03/14/22 0516  WBC 8.1 8.8 6.8 7.4 6.2  HGB 9.0* 8.4* 7.8* 9.6* 9.4*  HCT 28.3* 26.3* 24.6* 29.4* 28.8*  MCV 91.3 90.7 90.4 89.4 89.2  PLT 143* 146* 171 180 182   Microbiology: Results for orders placed or performed during the hospital encounter of 03/19/20  Resp Panel by RT-PCR (Flu A&B, Covid) Nasopharyngeal Swab     Status: None   Collection Time:  03/19/20  3:26 PM   Specimen: Nasopharyngeal Swab; Nasopharyngeal(NP) swabs in vial transport medium  Result Value Ref Range Status   SARS Coronavirus 2 by RT PCR NEGATIVE NEGATIVE Final    Comment: (NOTE) SARS-CoV-2 target nucleic acids are NOT DETECTED.  The SARS-CoV-2 RNA is generally detectable in upper respiratory specimens during the acute phase of infection. The lowest concentration of SARS-CoV-2 viral copies this assay can detect is 138 copies/mL. A negative result does not preclude SARS-Cov-2 infection and should not be used as the sole basis for treatment or other patient management decisions. A negative result may occur with  improper specimen collection/handling, submission of specimen other than nasopharyngeal swab, presence of viral mutation(s) within the areas targeted by this assay, and inadequate number of viral copies(<138 copies/mL). A negative result must be combined with clinical observations,  patient history, and epidemiological information. The expected result is Negative.  Fact Sheet for Patients:  EntrepreneurPulse.com.au  Fact Sheet for Healthcare Providers:  IncredibleEmployment.be  This test is no t yet approved or cleared by the [place] FDA and  has been authorized for detection and/or diagnosis of SARS-CoV-2 by FDA under an Emergency Use Authorization (EUA). This EUA will remain  in effect (meaning this test can be used) for the duration of the COVID-19 declaration under Section 564(b)(1) of the Act, 21 U.S.C.section 360bbb-3(b)(1), unless the authorization is terminated  or revoked sooner.       Influenza A by PCR NEGATIVE NEGATIVE Final   Influenza B by PCR NEGATIVE NEGATIVE Final    Comment: (NOTE) The Xpert Xpress SARS-CoV-2/FLU/RSV plus assay is intended as an aid in the diagnosis of influenza from Nasopharyngeal swab specimens and should not be used as a sole basis for treatment. Nasal washings  and aspirates are unacceptable for Xpert Xpress SARS-CoV-2/FLU/RSV testing.  Fact Sheet for Patients: EntrepreneurPulse.com.au  Fact Sheet for Healthcare Providers: IncredibleEmployment.be  This test is not yet approved or cleared by the [place] FDA and has been authorized for detection and/or diagnosis of SARS-CoV-2 by FDA under an Emergency Use Authorization (EUA). This EUA will remain in effect (meaning this test can be used) for the duration of the COVID-19 declaration under Section 564(b)(1) of the Act, 21 U.S.C. section 360bbb-3(b)(1), unless the authorization is terminated or revoked.  Performed at [facility], [address], [place], [place] [address]    Time coordinating discharge: Over 30 minutes  Richarda Osmond, MD  [facility] 03/14/2022, 10:53 AM

## 2022-03-14 LAB — TYPE AND SCREEN
ABO/RH(D): O NEG
Antibody Screen: NEGATIVE
Unit division: 0

## 2022-03-14 LAB — CBC
HCT: 28.8 % — ABNORMAL LOW (ref 36.0–46.0)
Hemoglobin: 9.4 g/dL — ABNORMAL LOW (ref 12.0–15.0)
MCH: 29.1 pg (ref 26.0–34.0)
MCHC: 32.6 g/dL (ref 30.0–36.0)
MCV: 89.2 fL (ref 80.0–100.0)
Platelets: 182 10*3/uL (ref 150–400)
RBC: 3.23 MIL/uL — ABNORMAL LOW (ref 3.87–5.11)
RDW: 15.1 % (ref 11.5–15.5)
WBC: 6.2 10*3/uL (ref 4.0–10.5)
nRBC: 0.5 % — ABNORMAL HIGH (ref 0.0–0.2)

## 2022-03-14 LAB — BPAM RBC
Blood Product Expiration Date: 202311142359
ISSUE DATE / TIME: 202311131106
Unit Type and Rh: 9500

## 2022-03-14 MED ORDER — FLEET ENEMA 7-19 GM/118ML RE ENEM: 1.0000 | ENEMA | Freq: Every day | RECTAL | 0 refills | Status: AC | PRN

## 2022-03-14 MED ORDER — POLYETHYLENE GLYCOL 3350 17 G PO PACK: 17.0000 g | PACK | Freq: Every day | ORAL | 0 refills | Status: AC

## 2022-03-14 MED ORDER — MIDODRINE HCL 10 MG PO TABS: 10.0000 mg | ORAL_TABLET | Freq: Three times a day (TID) | ORAL | 0 refills | Status: AC

## 2022-03-14 NOTE — Progress Notes (Signed)
PT Cancellation Note  Patient Details Name: Destiny Oliver MRN: [id number] DOB: 08-03-1926   Cancelled Treatment:    Reason Eval/Treat Not Completed: Medical issues which prohibited therapy  Pt receiving blood this pm.  Will return tomorrow after completed.    Chesley Noon 03/14/2022, 8:07 AM

## 2022-03-14 NOTE — TOC Progression Note (Signed)
Transition of Care [facility]) - Progression Note    Patient Details  Name: Destiny Oliver MRN: [id number] Date of Birth: 10/13/1926  Transition of Care [facility]) CM/SW [place], RN Phone Number: 03/14/2022, 11:11 AM  Clinical Narrative:     Going to room 508 at [facility] and spoke to [place] and let her know a room number 508, she asked that I have EMS wait until 1 for pick up so she can be here and get her things EMS called and arranged for 1   Expected Discharge Plan: [place] Barriers to Discharge: SNF Pending bed offer  Expected Discharge Plan and Services Expected Discharge Plan: [place]   Discharge Planning Services: CM Consult   Living arrangements for the past 2 months: [place] Expected Discharge Date: 03/14/22                                     Social Determinants of Health (SDOH) Interventions    Readmission Risk Interventions     No data to display

## 2022-03-14 NOTE — Progress Notes (Signed)
Physical Therapy Treatment Patient Details Name: Destiny Oliver MRN: [id number] DOB: 08-03-1926 Today's Date: 03/14/2022   History of Present Illness Pt is 88 y/o female s/p mechanical fall results in L LE IM nailing. Pt with PMH of CHF, HTN,and DM.    PT Comments    Pt ready to get up.  To EOB with mod a x 1.  Stood and transferred to [facility] with min a x 1 and increased time.  She is able to void then stand and turn 180 degrees to recliner with significantly increased time and +2 for safety due to distance.  She continues to be hesitant to put weight LLE making gait challenging to increase distances.  Fatigued with effort from tasks but remains in chair for breakfast.   Recommendations for follow up therapy are one component of a multi-disciplinary discharge planning process, led by the attending physician.  Recommendations may be updated based on patient status, additional functional criteria and insurance authorization.  Follow Up Recommendations  Skilled nursing-short term rehab (<3 hours/day)     Assistance Recommended at Discharge    Patient can return home with the following A little help with walking and/or transfers;Help with stairs or ramp for entrance;A little help with bathing/dressing/bathroom;Assist for transportation   Equipment Recommendations  None recommended by PT    Recommendations for Other Services       Precautions / Restrictions Precautions Precautions: Fall Restrictions Weight Bearing Restrictions: Yes LLE Weight Bearing: Weight bearing as tolerated     Mobility  Bed Mobility Overal bed mobility: Needs Assistance Bed Mobility: Supine to Sit     Supine to sit: Mod assist, HOB elevated          Transfers Overall transfer level: Needs assistance Equipment used: Rolling walker (2 wheels) Transfers: Sit to/from Stand, Bed to chair/wheelchair/BSC Sit to Stand: Min assist   Step pivot transfers: Min assist, +2 physical assistance             Ambulation/Gait Ambulation/Gait assistance: Min assist, +2 physical assistance Gait Distance (Feet): 4 Feet Assistive device: Rolling walker (2 wheels) Gait Pattern/deviations: Step-to pattern, Decreased step length - right, Decreased step length - left Gait velocity: decreased     General Gait Details: able to turn 90 degrees to commode then 180 degrees to recliner.   Stairs             Wheelchair Mobility    Modified Rankin (Stroke Patients Only)       Balance Overall balance assessment: Needs assistance Sitting-balance support: Feet supported Sitting balance-Leahy Scale: Good Sitting balance - Comments: able to sit on BSC and at EOB with close supervision   Standing balance support: During functional activity, Reliant on assistive device for balance, Bilateral upper extremity supported Standing balance-Leahy Scale: Fair Standing balance comment: patient required BUE assist on RW in standing, heavy forward lean but no LOB                            Cognition Arousal/Alertness: Awake/alert Behavior During Therapy: WFL for tasks assessed/performed Overall Cognitive Status: Within Functional Limits for tasks assessed                                          Exercises      General Comments        Pertinent Vitals/Pain Pain Assessment Pain  Assessment: Faces Faces Pain Scale: Hurts little more Pain Location: L LE Pain Descriptors / Indicators: Aching, Discomfort Pain Intervention(s): Limited activity within patient's tolerance, Monitored during session, Repositioned    Home Living                          Prior Function            PT Goals (current goals can now be found in the care plan section) Progress towards PT goals: Progressing toward goals    Frequency    BID      PT Plan Current plan remains appropriate    Co-evaluation              AM-PAC PT "6 Clicks" Mobility   Outcome Measure   Help needed turning from your back to your side while in a flat bed without using bedrails?: A Lot Help needed moving from lying on your back to sitting on the side of a flat bed without using bedrails?: A Lot Help needed moving to and from a bed to a chair (including a wheelchair)?: A Lot Help needed standing up from a chair using your arms (e.g., wheelchair or bedside chair)?: A Lot Help needed to walk in hospital room?: A Lot Help needed climbing 3-5 steps with a railing? : Total 6 Click Score: 11    End of Session Equipment Utilized During Treatment: Gait belt Activity Tolerance: Patient tolerated treatment well Patient left: in chair;with call bell/phone within reach;with chair alarm set;with family/visitor present Nurse Communication: Mobility status PT Visit Diagnosis: History of falling (Z91.81);Other abnormalities of gait and mobility (R26.89)     Time: 6644-0347 PT Time Calculation (min) (ACUTE ONLY): 14 min  Charges:  $Therapeutic Activity: 8-22 mins                   Chesley Noon, PTA 03/14/22, 9:33 AM

## 2022-03-14 NOTE — Plan of Care (Signed)
  Problem: Education: Goal: Ability to describe self-care measures that may prevent or decrease complications (Diabetes Survival Skills Education) will improve Outcome: Progressing Goal: Individualized Educational Video(s) Outcome: Progressing   Problem: Coping: Goal: Ability to adjust to condition or change in health will improve Outcome: Progressing   Problem: Fluid Volume: Goal: Ability to maintain a balanced intake and output will improve Outcome: Progressing   Problem: Health Behavior/Discharge Planning: Goal: Ability to identify and utilize available resources and services will improve Outcome: Progressing Goal: Ability to manage health-related needs will improve Outcome: Progressing   Problem: Nutritional: Goal: Maintenance of adequate nutrition will improve Outcome: Progressing Goal: Progress toward achieving an optimal weight will improve Outcome: Progressing   Problem: Skin Integrity: Goal: Risk for impaired skin integrity will decrease Outcome: Progressing   Problem: Health Behavior/Discharge Planning: Goal: Ability to manage health-related needs will improve Outcome: Progressing   Problem: Nutrition: Goal: Adequate nutrition will be maintained Outcome: Progressing   Problem: Coping: Goal: Level of anxiety will decrease Outcome: Progressing   Problem: Pain Managment: Goal: General experience of comfort will improve Outcome: Progressing

## 2022-03-14 NOTE — Care Management Important Message (Signed)
Important Message  Patient Details  Name: Destiny Oliver MRN: [id number] Date of Birth: 1926/08/24   Medicare Important Message Given:  Yes     Juliann Pulse A Fausto Sampedro 03/14/2022, 1:21 PM

## 2022-03-14 NOTE — Progress Notes (Signed)
   Subjective: 5 Days Post-Op Procedure(s) (LRB): INTRAMEDULLARY (IM) NAIL INTERTROCHANTERIC (Left) Patient reports pain as mild.   Patient is well, and has had no acute complaints or problems Denies any CP, SOB, ABD pain. We will continue therapy today.  Plan is to go Skilled nursing facility after hospital stay.  Objective: Vital signs in last 24 hours: Temp:  [97.5 F (36.4 C)-98.3 F (36.8 C)] 97.8 F (36.6 C) (11/14 0731) Pulse Rate:  [85-106] 100 (11/14 0731) Resp:  [16-18] 17 (11/14 0731) BP: (99-153)/(57-87) 99/70 (11/14 0731) SpO2:  [93 %-98 %] 94 % (11/14 0731)  Intake/Output from previous day: 11/13 0701 - 11/14 0700 In: 401 [I.V.:4; Blood:397] Out: 300 [Urine:300] Intake/Output this shift: No intake/output data recorded.  Recent Labs    03/12/22 0455 03/13/22 0506 03/13/22 1546 03/14/22 0516  HGB 8.4* 7.8* 9.6* 9.4*   Recent Labs    03/13/22 1546 03/14/22 0516  WBC 7.4 6.2  RBC 3.29* 3.23*  HCT 29.4* 28.8*  PLT 180 182   Recent Labs    03/12/22 0455  NA 139  K 4.7  CL 113*  CO2 22  BUN 42*  CREATININE 1.69*  GLUCOSE 124*  CALCIUM 8.1*   No results for input(s): "LABPT", "INR" in the last 72 hours.   EXAM General - Patient is Alert, Appropriate, and Oriented Extremity - Neurovascular intact Sensation intact distally Intact pulses distally Dorsiflexion/Plantar flexion intact Dressing - dressing C/D/I and no drainage Motor Function - intact, moving foot and toes well on exam.   Past Medical History:  Diagnosis Date   Arrhythmia    atrial fibrillation   Breast cancer ([place]) 1994   right breast   CHF (congestive heart failure) (HCC)    CKD (chronic kidney disease)    Diabetes mellitus without complication (HCC)    Hyperlipidemia    Hypertension    Pulmonary HTN (HCC)    Tachycardia     Assessment/Plan:   5 Days Post-Op Procedure(s) (LRB): INTRAMEDULLARY (IM) NAIL INTERTROCHANTERIC (Left) Principal Problem:    Intertrochanteric fracture of left femur, closed, initial encounter (Destiny Oliver) Active Problems:   Paroxysmal atrial fibrillation (HCC)   CKD (chronic kidney disease) stage 4, GFR 15-29 ml/min (HCC)   Chronic diastolic CHF (congestive heart failure) (HCC)   Type 2 diabetes mellitus (HCC)   Hypothyroidism   History of anemia due to CKD   Dementia without behavioral disturbance (Destiny Oliver)   Fall   Left hip pain   Postprocedural hypotension   Acute postoperative anemia due to expected blood loss  Estimated body mass index is 35.74 kg/m as calculated from the following:   Height as of this encounter: 5' (1.524 m).   Weight as of this encounter: 83 kg. Advance diet Up with therapy Pain well controlled VSS Labs stable. Hgb 9.4.  Status post 1 unit packed red blood cells 03/13/2022. Care management to assist with discharge to skilled nursing facility today  Follow-up with [facility] orthopedics in 2 weeks post op Resume Eliquis TED hose bilateral lower extremity x2 weeks    DVT Prophylaxis -  SCDs  Eliquis Weight-Bearing as tolerated to left leg   T. Rachelle Hora, PA-C [place] 03/14/2022, 8:16 AM

## 2022-04-12 ENCOUNTER — Emergency Department: Payer: Medicare Other

## 2022-04-12 ENCOUNTER — Emergency Department
Admission: EM | Admit: 2022-04-12 | Discharge: 2022-04-12 | Disposition: A | Discharge status: Home / Self Care | Payer: Medicare Other | Source: Home / Self Care | Attending: Student in an Organized Health Care Education/Training Program | Admitting: Student in an Organized Health Care Education/Training Program

## 2022-04-12 ENCOUNTER — Other Ambulatory Visit: Payer: Self-pay

## 2022-04-12 DIAGNOSIS — E119 Type 2 diabetes mellitus without complications: Secondary | ICD-10-CM | POA: Insufficient documentation

## 2022-04-12 DIAGNOSIS — I480 Paroxysmal atrial fibrillation: Secondary | ICD-10-CM | POA: Insufficient documentation

## 2022-04-12 DIAGNOSIS — J9601 Acute respiratory failure with hypoxia: Secondary | ICD-10-CM | POA: Diagnosis not present

## 2022-04-12 DIAGNOSIS — I509 Heart failure, unspecified: Secondary | ICD-10-CM | POA: Insufficient documentation

## 2022-04-12 DIAGNOSIS — N189 Chronic kidney disease, unspecified: Secondary | ICD-10-CM | POA: Insufficient documentation

## 2022-04-12 DIAGNOSIS — Z20822 Contact with and (suspected) exposure to covid-19: Secondary | ICD-10-CM | POA: Insufficient documentation

## 2022-04-12 DIAGNOSIS — M7989 Other specified soft tissue disorders: Secondary | ICD-10-CM | POA: Insufficient documentation

## 2022-04-12 DIAGNOSIS — I130 Hypertensive heart and chronic kidney disease with heart failure and stage 1 through stage 4 chronic kidney disease, or unspecified chronic kidney disease: Secondary | ICD-10-CM | POA: Diagnosis not present

## 2022-04-12 DIAGNOSIS — E876 Hypokalemia: Secondary | ICD-10-CM | POA: Insufficient documentation

## 2022-04-12 LAB — COMPREHENSIVE METABOLIC PANEL
ALT: 17 U/L (ref 0–44)
AST: 22 U/L (ref 15–41)
Albumin: 3.5 g/dL (ref 3.5–5.0)
Alkaline Phosphatase: 153 U/L — ABNORMAL HIGH (ref 38–126)
Anion gap: 10 (ref 5–15)
BUN: 21 mg/dL (ref 8–23)
CO2: 29 mmol/L (ref 22–32)
Calcium: 9 mg/dL (ref 8.9–10.3)
Chloride: 102 mmol/L (ref 98–111)
Creatinine, Ser: 1.42 mg/dL — ABNORMAL HIGH (ref 0.44–1.00)
GFR, Estimated: 34 mL/min — ABNORMAL LOW (ref >60)
Glucose, Bld: 111 mg/dL — ABNORMAL HIGH (ref 70–99)
Potassium: 2.8 mmol/L — ABNORMAL LOW (ref 3.5–5.1)
Sodium: 141 mmol/L (ref 135–145)
Total Bilirubin: 1.2 mg/dL (ref 0.3–1.2)
Total Protein: 6.9 g/dL (ref 6.5–8.1)

## 2022-04-12 LAB — CBC WITH DIFFERENTIAL/PLATELET
Abs Immature Granulocytes: 0.03 10*3/uL (ref 0.00–0.07)
Basophils Absolute: 0 10*3/uL (ref 0.0–0.1)
Basophils Relative: 0 %
Eosinophils Absolute: 0.1 10*3/uL (ref 0.0–0.5)
Eosinophils Relative: 1 %
HCT: 38.6 % (ref 36.0–46.0)
Hemoglobin: 11.8 g/dL — ABNORMAL LOW (ref 12.0–15.0)
Immature Granulocytes: 0 %
Lymphocytes Relative: 20 %
Lymphs Abs: 1.4 10*3/uL (ref 0.7–4.0)
MCH: 28.4 pg (ref 26.0–34.0)
MCHC: 30.6 g/dL (ref 30.0–36.0)
MCV: 93 fL (ref 80.0–100.0)
Monocytes Absolute: 0.6 10*3/uL (ref 0.1–1.0)
Monocytes Relative: 9 %
Neutro Abs: 4.8 10*3/uL (ref 1.7–7.7)
Neutrophils Relative %: 70 %
Platelets: 268 10*3/uL (ref 150–400)
RBC: 4.15 MIL/uL (ref 3.87–5.11)
RDW: 15 % (ref 11.5–15.5)
WBC: 6.9 10*3/uL (ref 4.0–10.5)
nRBC: 0 % (ref 0.0–0.2)

## 2022-04-12 LAB — BASIC METABOLIC PANEL
Anion gap: 6 (ref 5–15)
BUN: 20 mg/dL (ref 8–23)
CO2: 31 mmol/L (ref 22–32)
Calcium: 8.5 mg/dL — ABNORMAL LOW (ref 8.9–10.3)
Chloride: 106 mmol/L (ref 98–111)
Creatinine, Ser: 1.32 mg/dL — ABNORMAL HIGH (ref 0.44–1.00)
GFR, Estimated: 38 mL/min — ABNORMAL LOW (ref >60)
Glucose, Bld: 107 mg/dL — ABNORMAL HIGH (ref 70–99)
Potassium: 3.4 mmol/L — ABNORMAL LOW (ref 3.5–5.1)
Sodium: 143 mmol/L (ref 135–145)

## 2022-04-12 LAB — BRAIN NATRIURETIC PEPTIDE: B Natriuretic Peptide: 512.3 pg/mL — ABNORMAL HIGH (ref 0.0–100.0)

## 2022-04-12 LAB — RESP PANEL BY RT-PCR (RSV, FLU A&B, COVID)  RVPGX2
Influenza A by PCR: NEGATIVE
Influenza B by PCR: NEGATIVE
Resp Syncytial Virus by PCR: NEGATIVE
SARS Coronavirus 2 by RT PCR: NEGATIVE

## 2022-04-12 LAB — TROPONIN I (HIGH SENSITIVITY)
Troponin I (High Sensitivity): 16 ng/L (ref <18)
Troponin I (High Sensitivity): 16 ng/L (ref <18)

## 2022-04-12 LAB — MAGNESIUM: Magnesium: 2 mg/dL (ref 1.7–2.4)

## 2022-04-12 MED ORDER — POTASSIUM CHLORIDE 10 MEQ/100ML IV SOLN
10.0000 meq | Freq: Once | INTRAVENOUS | Status: AC
  Administered 2022-04-12: 10 meq via INTRAVENOUS
  Filled 2022-04-12: qty 100

## 2022-04-12 MED ORDER — POTASSIUM CHLORIDE CRYS ER 20 MEQ PO TBCR
40.0000 meq | EXTENDED_RELEASE_TABLET | Freq: Once | ORAL | Status: AC
  Administered 2022-04-12: 40 meq via ORAL
  Filled 2022-04-12: qty 2

## 2022-04-12 MED ORDER — POTASSIUM CHLORIDE CRYS ER 20 MEQ PO TBCR: 20.0000 meq | EXTENDED_RELEASE_TABLET | Freq: Every day | ORAL | 0 refills | Status: AC

## 2022-04-12 NOTE — ED Notes (Signed)
US at bedside at this time 

## 2022-04-12 NOTE — ED Triage Notes (Signed)
Pt to ED via [facility] from [facility], pt complaining of SOB. Per EMS pt just left [facility] on Friday for femur fx rehab, Nurse Practioner at facility did rounds and found irreg HR and afib. Pt denies any CP, abd pain. Pt states "was lying in bed and suddenly become SOB."

## 2022-04-12 NOTE — ED Provider Notes (Signed)
Care assumed of patient from outgoing provider.  See their note for initial history, exam and plan.  Clinical Course as of 04/12/22 1549  Wed Apr 12, 2022  1326 Potassium is low we will replete with IV and p.o. potassium [PR]  1350 Chest x-ray my review and interpretation does show cardiomegaly but no evidence of effusion or consolidation.  Will await formal radiology report. [PR]  1432 Magnesium normal.  Heart rate is normal. viral panel negative. [PR]  1502 Patient reassessed.  She denies any shortness of breath.  Remains in rate controlled A-fib.  She is already on Eliquis.  No sign of DVT.  This not consistent with PE.  Will continue replating potassium and repeat potassium to ensure that it is improving.  Anticipate patient will be appropriate for outpatient follow-up pending repeat troponin.  Patient signed out to oncoming physician. [PR]  1506 H/o afib on AC, recent femur fx, irregular HR and dyspnea.  Afib and rate controlled. K 2.8 - Repeat trop and K and likely dc home.  [SM]    Clinical Course User Index [PR] Merlyn Lot, MD [SM] Nathaniel Man, MD    Repeat potassium is improved to 3.4.  Troponin negative.  Patient states that she is feeling better.  Continuing her last round of potassium.  Will not send out with potassium given her significant improvement of potassium in the ER.  Discussed follow-up with her primary care physician for potassium recheck.  Given return precautions.   Nathaniel Man, MD 04/12/22 1549

## 2022-04-12 NOTE — Discharge Instructions (Addendum)
You were seen in the emergency department for an irregular heart rate and shortness of breath.  Your irregular heart rate was your atrial fibrillation.  Your potassium was mildly low at 2.8 so you were given potassium replacement and improved to 3.4.  It is importantly continue to eat foods that are high in potassium since you are on Lasix.  Follow-up closely with your primary care provider to recheck your potassium level.  Return to the emergency department for any worsening symptoms.

## 2022-04-12 NOTE — ED Provider Notes (Addendum)
[facility] Provider Note    Event Date/Time   First MD Initiated Contact with Patient 04/12/22 1252     (approximate)   History   Shortness of Breath and Atrial Fibrillation   HPI  Destiny Oliver is a 88 y.o. female with a history of paroxysmal A-fib, CHF CKD diabetes pulmonary hypertension recent admission for left femur fracture on Eliquis presents to ER from facility due to episode of shortness of breath and irregular heartbeat.  Also reporting worsening swelling in the legs left greater than right.  She denies any pain or pressure right now.     Physical Exam   Triage Vital Signs: ED Triage Vitals  Enc Vitals Group     BP 04/12/22 1235 (!) 151/79     Pulse Rate 04/12/22 1235 (!) 110     Resp 04/12/22 1235 20     Temp 04/12/22 1235 97.9 F (36.6 C)     Temp Source 04/12/22 1235 Oral     SpO2 04/12/22 1235 98 %     Weight 04/12/22 1237 186 lb (84.4 kg)     Height 04/12/22 1237 '5\' 1"'$  (1.549 m)     Head Circumference --      Peak Flow --      Pain Score 04/12/22 1236 0     Pain Loc --      Pain Edu? --      Excl. in [place]? --     Most recent vital signs: Vitals:   04/12/22 1330 04/12/22 1450  BP: 136/63 128/83  Pulse: 95 92  Resp: 11 (!) 25  Temp:    SpO2: 92% 94%     Constitutional: Alert  Eyes: Conjunctivae are normal.  Head: Atraumatic. Nose: No congestion/rhinnorhea. Mouth/Throat: Mucous membranes are moist.   Neck: Painless ROM.  Cardiovascular:   Good peripheral circulation.  Irregular rhythm, normal rate Respiratory: Normal respiratory effort.  No retractions.  Gastrointestinal: Soft and nontender.  Compartments soft  Musculoskeletal:  no deformity Neurologic:  MAE spontaneously. No gross focal neurologic deficits are appreciated.  Skin:  Skin is warm, dry and intact. No rash noted. Psychiatric: Mood and affect are normal. Speech and behavior are normal.    ED Results / Procedures / Treatments   Labs (all labs  ordered are listed, but only abnormal results are displayed) Labs Reviewed  CBC WITH DIFFERENTIAL/PLATELET - Abnormal; Notable for the following components:      Result Value   Hemoglobin 11.8 (*)    All other components within normal limits  COMPREHENSIVE METABOLIC PANEL - Abnormal; Notable for the following components:   Potassium 2.8 (*)    Glucose, Bld 111 (*)    Creatinine, Ser 1.42 (*)    Alkaline Phosphatase 153 (*)    GFR, Estimated 34 (*)    All other components within normal limits  BRAIN NATRIURETIC PEPTIDE - Abnormal; Notable for the following components:   B Natriuretic Peptide 512.3 (*)    All other components within normal limits  RESP PANEL BY RT-PCR (RSV, FLU A&B, COVID)  RVPGX2  MAGNESIUM  BASIC METABOLIC PANEL  TROPONIN I (HIGH SENSITIVITY)  TROPONIN I (HIGH SENSITIVITY)     EKG  ED ECG REPORT I, Merlyn Lot, the attending physician, personally viewed and interpreted this ECG.   Date: 04/12/2022  EKG Time: 12:34  Rate: 100  Rhythm: afib  Axis: left  Intervals: normal qrs  ST&T Change: nonspecific t wave abn, no stemi    RADIOLOGY Please  see ED Course for my review and interpretation.  I personally reviewed all radiographic images ordered to evaluate for the above acute complaints and reviewed radiology reports and findings.  These findings were personally discussed with the patient.  Please see medical record for radiology report.    PROCEDURES:  Critical Care performed: No  Procedures   MEDICATIONS ORDERED IN ED: Medications  potassium chloride 10 mEq in 100 mL IVPB (10 mEq Intravenous New Bag/Given 04/12/22 1426)  potassium chloride 10 mEq in 100 mL IVPB (has no administration in time range)  potassium chloride SA (KLOR-CON M) CR tablet 40 mEq (40 mEq Oral Given 04/12/22 1421)     IMPRESSION / MDM / ASSESSMENT AND PLAN / ED COURSE  I reviewed the triage vital signs and the nursing notes.                               Differential diagnosis includes, but is not limited to, CHF, COPD, pneumonia, electrolyte abnormality, dysrhythmia, ACS, DVT  Patient presenting to the ER for evaluation of symptoms as described above.  Based on symptoms, risk factors and considered above differential, this presenting complaint could reflect a potentially life-threatening illness therefore the patient will be placed on continuous pulse oximetry and telemetry for monitoring.  Laboratory evaluation will be sent to evaluate for the above complaints.      Clinical Course as of 04/12/22 1509  Wed Apr 12, 2022  1326 Potassium is low we will replete with IV and p.o. potassium [PR]  1350 Chest x-ray my review and interpretation does show cardiomegaly but no evidence of effusion or consolidation.  Will await formal radiology report. [PR]  1432 Magnesium normal.  Heart rate is normal. viral panel negative. [PR]  1502 Patient reassessed.  She denies any shortness of breath.  Remains in rate controlled A-fib.  She is already on Eliquis.  No sign of DVT.  This not consistent with PE.  Will continue replating potassium and repeat potassium to ensure that it is improving.  Anticipate patient will be appropriate for outpatient follow-up pending repeat troponin.  Patient signed out to oncoming physician. [PR]  1506 H/o afib on AC, recent femur fx, irregular HR and dyspnea.  Afib and rate controlled. K 2.8 - Repeat trop and K and likely dc home.  [SM]    Clinical Course User Index [PR] Merlyn Lot, MD [SM] Nathaniel Man, MD      FINAL CLINICAL IMPRESSION(S) / ED DIAGNOSES   Final diagnoses:  Paroxysmal atrial fibrillation ([place])  Hypokalemia     Rx / DC Orders   ED Discharge Orders          Ordered    potassium chloride SA (KLOR-CON M) 20 MEQ tablet  Daily        04/12/22 1508             Note:  This document was prepared using Dragon voice recognition software and may include unintentional dictation errors.     Merlyn Lot, MD 04/12/22 1506    Merlyn Lot, MD 04/12/22 [id number]

## 2022-04-12 NOTE — ED Notes (Signed)
Pt transported to XR.  

## 2022-04-12 NOTE — ED Notes (Signed)
Pt legal guardian, Opal Sidles contacted regarding ride home for pt.

## 2022-04-14 ENCOUNTER — Encounter: Payer: Self-pay | Admitting: Internal Medicine

## 2022-04-14 ENCOUNTER — Emergency Department: Payer: Medicare Other

## 2022-04-14 ENCOUNTER — Inpatient Hospital Stay
Admission: EM | Admit: 2022-04-14 | Discharge: 2022-04-17 | DRG: 291 | Disposition: A | Discharge status: Home Health | Payer: Medicare Other | Attending: Internal Medicine | Admitting: Internal Medicine

## 2022-04-14 ENCOUNTER — Inpatient Hospital Stay
Admit: 2022-04-14 | Discharge: 2022-04-14 | Disposition: A | Discharge status: Home / Self Care | Payer: Medicare Other | Attending: Internal Medicine | Admitting: Internal Medicine

## 2022-04-14 DIAGNOSIS — E785 Hyperlipidemia, unspecified: Secondary | ICD-10-CM | POA: Diagnosis present

## 2022-04-14 DIAGNOSIS — I5043 Acute on chronic combined systolic (congestive) and diastolic (congestive) heart failure: Secondary | ICD-10-CM | POA: Diagnosis present

## 2022-04-14 DIAGNOSIS — Z888 Allergy status to other drugs, medicaments and biological substances status: Secondary | ICD-10-CM | POA: Diagnosis not present

## 2022-04-14 DIAGNOSIS — I480 Paroxysmal atrial fibrillation: Secondary | ICD-10-CM | POA: Diagnosis not present

## 2022-04-14 DIAGNOSIS — E039 Hypothyroidism, unspecified: Secondary | ICD-10-CM | POA: Diagnosis not present

## 2022-04-14 DIAGNOSIS — Z66 Do not resuscitate: Secondary | ICD-10-CM | POA: Diagnosis present

## 2022-04-14 DIAGNOSIS — Z6834 Body mass index (BMI) 34.0-34.9, adult: Secondary | ICD-10-CM

## 2022-04-14 DIAGNOSIS — Z881 Allergy status to other antibiotic agents status: Secondary | ICD-10-CM | POA: Diagnosis not present

## 2022-04-14 DIAGNOSIS — I10 Essential (primary) hypertension: Secondary | ICD-10-CM | POA: Diagnosis present

## 2022-04-14 DIAGNOSIS — K5900 Constipation, unspecified: Secondary | ICD-10-CM | POA: Diagnosis not present

## 2022-04-14 DIAGNOSIS — I5033 Acute on chronic diastolic (congestive) heart failure: Secondary | ICD-10-CM

## 2022-04-14 DIAGNOSIS — E1122 Type 2 diabetes mellitus with diabetic chronic kidney disease: Secondary | ICD-10-CM | POA: Diagnosis not present

## 2022-04-14 DIAGNOSIS — Z79899 Other long term (current) drug therapy: Secondary | ICD-10-CM | POA: Diagnosis not present

## 2022-04-14 DIAGNOSIS — I5A Non-ischemic myocardial injury (non-traumatic): Secondary | ICD-10-CM | POA: Diagnosis not present

## 2022-04-14 DIAGNOSIS — Z9011 Acquired absence of right breast and nipple: Secondary | ICD-10-CM

## 2022-04-14 DIAGNOSIS — E669 Obesity, unspecified: Secondary | ICD-10-CM | POA: Diagnosis present

## 2022-04-14 DIAGNOSIS — I130 Hypertensive heart and chronic kidney disease with heart failure and stage 1 through stage 4 chronic kidney disease, or unspecified chronic kidney disease: Secondary | ICD-10-CM | POA: Diagnosis present

## 2022-04-14 DIAGNOSIS — I129 Hypertensive chronic kidney disease with stage 1 through stage 4 chronic kidney disease, or unspecified chronic kidney disease: Secondary | ICD-10-CM | POA: Diagnosis not present

## 2022-04-14 DIAGNOSIS — Z7901 Long term (current) use of anticoagulants: Secondary | ICD-10-CM | POA: Diagnosis not present

## 2022-04-14 DIAGNOSIS — N184 Chronic kidney disease, stage 4 (severe): Secondary | ICD-10-CM | POA: Diagnosis not present

## 2022-04-14 DIAGNOSIS — E876 Hypokalemia: Secondary | ICD-10-CM | POA: Diagnosis present

## 2022-04-14 DIAGNOSIS — J9601 Acute respiratory failure with hypoxia: Secondary | ICD-10-CM | POA: Diagnosis present

## 2022-04-14 DIAGNOSIS — F0390 Unspecified dementia without behavioral disturbance: Secondary | ICD-10-CM | POA: Diagnosis present

## 2022-04-14 DIAGNOSIS — Z20822 Contact with and (suspected) exposure to covid-19: Secondary | ICD-10-CM | POA: Diagnosis present

## 2022-04-14 DIAGNOSIS — I5023 Acute on chronic systolic (congestive) heart failure: Secondary | ICD-10-CM

## 2022-04-14 DIAGNOSIS — Z880 Allergy status to penicillin: Secondary | ICD-10-CM

## 2022-04-14 DIAGNOSIS — Z853 Personal history of malignant neoplasm of breast: Secondary | ICD-10-CM | POA: Diagnosis not present

## 2022-04-14 LAB — COMPREHENSIVE METABOLIC PANEL
ALT: 17 U/L (ref 0–44)
AST: 27 U/L (ref 15–41)
Albumin: 3.8 g/dL (ref 3.5–5.0)
Alkaline Phosphatase: 156 U/L — ABNORMAL HIGH (ref 38–126)
Anion gap: 9 (ref 5–15)
BUN: 20 mg/dL (ref 8–23)
CO2: 26 mmol/L (ref 22–32)
Calcium: 9.5 mg/dL (ref 8.9–10.3)
Chloride: 106 mmol/L (ref 98–111)
Creatinine, Ser: 1.28 mg/dL — ABNORMAL HIGH (ref 0.44–1.00)
GFR, Estimated: 39 mL/min — ABNORMAL LOW (ref >60)
Glucose, Bld: 150 mg/dL — ABNORMAL HIGH (ref 70–99)
Potassium: 3.7 mmol/L (ref 3.5–5.1)
Sodium: 141 mmol/L (ref 135–145)
Total Bilirubin: 1.7 mg/dL — ABNORMAL HIGH (ref 0.3–1.2)
Total Protein: 7.7 g/dL (ref 6.5–8.1)

## 2022-04-14 LAB — CBC WITH DIFFERENTIAL/PLATELET
Abs Immature Granulocytes: 0.02 10*3/uL (ref 0.00–0.07)
Basophils Absolute: 0 10*3/uL (ref 0.0–0.1)
Basophils Relative: 0 %
Eosinophils Absolute: 0 10*3/uL (ref 0.0–0.5)
Eosinophils Relative: 0 %
HCT: 42.1 % (ref 36.0–46.0)
Hemoglobin: 12.5 g/dL (ref 12.0–15.0)
Immature Granulocytes: 0 %
Lymphocytes Relative: 7 %
Lymphs Abs: 0.5 10*3/uL — ABNORMAL LOW (ref 0.7–4.0)
MCH: 27.8 pg (ref 26.0–34.0)
MCHC: 29.7 g/dL — ABNORMAL LOW (ref 30.0–36.0)
MCV: 93.6 fL (ref 80.0–100.0)
Monocytes Absolute: 0.4 10*3/uL (ref 0.1–1.0)
Monocytes Relative: 5 %
Neutro Abs: 6.8 10*3/uL (ref 1.7–7.7)
Neutrophils Relative %: 88 %
Platelets: 260 10*3/uL (ref 150–400)
RBC: 4.5 MIL/uL (ref 3.87–5.11)
RDW: 15.4 % (ref 11.5–15.5)
WBC: 7.8 10*3/uL (ref 4.0–10.5)
nRBC: 0 % (ref 0.0–0.2)

## 2022-04-14 LAB — BRAIN NATRIURETIC PEPTIDE: B Natriuretic Peptide: 584.5 pg/mL — ABNORMAL HIGH (ref 0.0–100.0)

## 2022-04-14 LAB — TROPONIN I (HIGH SENSITIVITY)
Troponin I (High Sensitivity): 20 ng/L — ABNORMAL HIGH (ref <18)
Troponin I (High Sensitivity): 29 ng/L — ABNORMAL HIGH (ref <18)
Troponin I (High Sensitivity): 32 ng/L — ABNORMAL HIGH (ref <18)

## 2022-04-14 LAB — RESP PANEL BY RT-PCR (RSV, FLU A&B, COVID)  RVPGX2
Influenza A by PCR: NEGATIVE
Influenza B by PCR: NEGATIVE
Resp Syncytial Virus by PCR: NEGATIVE
SARS Coronavirus 2 by RT PCR: NEGATIVE

## 2022-04-14 MED ORDER — FLEET ENEMA 7-19 GM/118ML RE ENEM: 1.0000 | ENEMA | Freq: Every day | RECTAL | Status: DC | PRN

## 2022-04-14 MED ORDER — HYDRALAZINE HCL 20 MG/ML IJ SOLN: 5.0000 mg | INTRAMUSCULAR | Status: DC | PRN

## 2022-04-14 MED ORDER — NEOMYCIN-POLYMYXIN-DEXAMETH 3.5-10000-0.1 OP OINT
1.0000 | TOPICAL_OINTMENT | Freq: Every day | OPHTHALMIC | Status: DC
  Administered 2022-04-14 – 2022-04-16 (×3): 1 via OPHTHALMIC
  Filled 2022-04-14 (×2): qty 3.5

## 2022-04-14 MED ORDER — POLYVINYL ALCOHOL 1.4 % OP SOLN
1.0000 [drp] | OPHTHALMIC | Status: DC
  Administered 2022-04-14 – 2022-04-17 (×21): 1 [drp] via OPHTHALMIC
  Filled 2022-04-14: qty 15

## 2022-04-14 MED ORDER — METOPROLOL SUCCINATE ER 50 MG PO TB24
50.0000 mg | ORAL_TABLET | Freq: Every morning | ORAL | Status: DC
  Administered 2022-04-15 – 2022-04-17 (×3): 50 mg via ORAL
  Filled 2022-04-14 (×4): qty 1

## 2022-04-14 MED ORDER — METOPROLOL TARTRATE 5 MG/5ML IV SOLN: 5.0000 mg | INTRAVENOUS | Status: DC | PRN

## 2022-04-14 MED ORDER — ONDANSETRON HCL 4 MG/2ML IJ SOLN: 4.0000 mg | Freq: Three times a day (TID) | INTRAMUSCULAR | Status: DC | PRN

## 2022-04-14 MED ORDER — LEVOTHYROXINE SODIUM 75 MCG PO TABS
75.0000 ug | ORAL_TABLET | Freq: Every day | ORAL | Status: DC
  Administered 2022-04-15 – 2022-04-17 (×3): 75 ug via ORAL
  Filled 2022-04-14 (×3): qty 1

## 2022-04-14 MED ORDER — FUROSEMIDE 10 MG/ML IJ SOLN
40.0000 mg | Freq: Two times a day (BID) | INTRAMUSCULAR | Status: DC
  Administered 2022-04-14 – 2022-04-17 (×6): 40 mg via INTRAVENOUS
  Filled 2022-04-14 (×7): qty 4

## 2022-04-14 MED ORDER — OCUVITE-LUTEIN PO CAPS
1.0000 | ORAL_CAPSULE | Freq: Two times a day (BID) | ORAL | Status: DC
  Administered 2022-04-15 (×2): 1 via ORAL
  Filled 2022-04-14 (×3): qty 1

## 2022-04-14 MED ORDER — VITAMIN D 25 MCG (1000 UNIT) PO TABS
2000.0000 [IU] | ORAL_TABLET | Freq: Every day | ORAL | Status: DC
  Administered 2022-04-15 – 2022-04-17 (×3): 2000 [IU] via ORAL
  Filled 2022-04-14 (×3): qty 2

## 2022-04-14 MED ORDER — DM-GUAIFENESIN ER 30-600 MG PO TB12: 1.0000 | ORAL_TABLET | Freq: Two times a day (BID) | ORAL | Status: DC | PRN

## 2022-04-14 MED ORDER — FUROSEMIDE 10 MG/ML IJ SOLN
40.0000 mg | Freq: Once | INTRAMUSCULAR | Status: AC
  Administered 2022-04-14: 40 mg via INTRAVENOUS
  Filled 2022-04-14: qty 4

## 2022-04-14 MED ORDER — FUROSEMIDE 10 MG/ML IJ SOLN: 40.0000 mg | Freq: Two times a day (BID) | INTRAMUSCULAR | Status: DC

## 2022-04-14 MED ORDER — ACETAMINOPHEN 325 MG PO TABS: 650.0000 mg | ORAL_TABLET | Freq: Four times a day (QID) | ORAL | Status: DC | PRN

## 2022-04-14 MED ORDER — ALBUTEROL SULFATE (2.5 MG/3ML) 0.083% IN NEBU: 2.5000 mg | INHALATION_SOLUTION | RESPIRATORY_TRACT | Status: DC | PRN

## 2022-04-14 MED ORDER — APIXABAN 2.5 MG PO TABS
2.5000 mg | ORAL_TABLET | Freq: Two times a day (BID) | ORAL | Status: DC
  Administered 2022-04-14 – 2022-04-17 (×6): 2.5 mg via ORAL
  Filled 2022-04-14 (×7): qty 1

## 2022-04-14 MED ORDER — POLYETHYLENE GLYCOL 3350 17 G PO PACK: 17.0000 g | PACK | Freq: Every day | ORAL | Status: DC | PRN

## 2022-04-14 MED ORDER — FERROUS SULFATE 325 (65 FE) MG PO TABS
325.0000 mg | ORAL_TABLET | Freq: Every morning | ORAL | Status: DC
  Administered 2022-04-15 – 2022-04-17 (×3): 325 mg via ORAL
  Filled 2022-04-14 (×4): qty 1

## 2022-04-14 MED ORDER — SENNA 8.6 MG PO TABS: 2.0000 | ORAL_TABLET | Freq: Every day | ORAL | Status: DC | PRN

## 2022-04-14 NOTE — H&P (Signed)
History and Physical    Destiny Oliver IHK:[id number] DOB: March 24, 1927 DOA: 04/14/2022  Referring MD/NP/PA:   PCP: System, Provider Not In   Patient coming from:  The patient is coming from SNF  Chief Complaint: SOB  HPI: Destiny Oliver is a 88 y.o. female with medical history significant of hypertension, hyperlipidemia, diet-controlled diabetes, hypothyroidism, diastolic CHF, pulmonary hypertension, CKD stage IV, atrial fibrillation on Eliquis, breast cancer, iron deficiency anemia, dementia, recent admission due to left hip fracture, who presents with shortness breath.  Patient was recently hospitalized from 11/9 - 11/14 due to left hip fracture.  She started having shortness this morning, which has been progressively worsening.  No chest pain, cough, fever or chills.  Patient does not have nausea, vomiting, diarrhea or abdominal pain.  No symptoms of UTI.  Patient is taking Eliquis consistently.  Last dose was this morning. Patient is normally not using oxygen.  He was found to have oxygen desaturation to 84% on room air, which improved to 96% on 2 L oxygen in ED. Of note, patient has chronic bilateral lower leg edema, the left is worse than the right.  Patient had negative left lower extremity venous Doppler for DVT two days ago on 04/12/2022.   Data reviewed independently and ED Course: pt was found to have BNP 585, troponin level trending, potassium 3.7, stable renal function, temperature normal, blood pressure 147/81, heart rate 108, RR 20.  Chest x-ray showed pulmonary edema and cardiomegaly.  Patient is admitted to telemetry bed as inpatient.   EKG: I have personally reviewed.  Seems to be atrial fibrillation, QTc 497, LAD, poor R wave progression.   Review of Systems:   General: no fevers, chills, no body weight gain, has fatigue HEENT: no blurry vision, hearing changes or sore throat Respiratory: has dyspnea, no coughing, wheezing CV: no chest pain, no palpitations GI: no  nausea, vomiting, abdominal pain, diarrhea, constipation GU: no dysuria, burning on urination, increased urinary frequency, hematuria  Ext: has leg edema Neuro: no unilateral weakness, numbness, or tingling, no vision change or hearing loss Skin: no rash, no skin tear. MSK: No muscle spasm, no deformity, no limitation of range of movement in spin Heme: No easy bruising.  Travel history: No recent long distant travel.   Allergy:  Allergies  Allergen Reactions   Calcium Channel Blockers    Cephalosporins    Isosorbide Nitrate    Penicillins Swelling    Past Medical History:  Diagnosis Date   Arrhythmia    atrial fibrillation   Breast cancer ([place]) 1994   right breast   CHF (congestive heart failure) (HCC)    CKD (chronic kidney disease)    Diabetes mellitus without complication ([place])    Hyperlipidemia    Hypertension    Pulmonary HTN ([place])    Tachycardia     Past Surgical History:  Procedure Laterality Date   INTRAMEDULLARY (IM) NAIL INTERTROCHANTERIC Left 03/09/2022   Procedure: INTRAMEDULLARY (IM) NAIL INTERTROCHANTERIC;  Surgeon: Steffanie Rainwater, MD;  Location: [facility] ORS;  Service: Orthopedics;  Laterality: Left;   MASTECTOMY Right 1994    Social History:  reports that she has never smoked. She has never used smokeless tobacco. She reports that she does not drink alcohol and does not use drugs.  Family History:  Family History  Problem Relation Age of Onset   Lung cancer Brother      Prior to Admission medications   Medication Sig Start Date End Date Taking? Authorizing Provider  acetaminophen (TYLENOL)  325 MG tablet Take 1-2 tablets (325-650 mg total) by mouth every 6 (six) hours as needed for mild pain (pain score 1-3 or temp > 100.5). 03/10/22   Duanne Guess, PA-C  albuterol (VENTOLIN HFA) 108 (90 Base) MCG/ACT inhaler Inhale 2 puffs into the lungs every 6 (six) hours as needed for wheezing or shortness of breath.    [provider]  apixaban  (ELIQUIS) 2.5 MG TABS tablet Take 1 tablet (2.5 mg total) by mouth 2 (two) times daily. 04/30/18   Demetrios Loll, MD  Carboxymethylcellulose Sodium (THERATEARS) 0.25 % SOLN Place 1 drop into both eyes every 2 (two) hours while awake.    [provider]  Cholecalciferol (VITAMIN D) 50 MCG (2000 UT) tablet Take 2,000 Units by mouth daily.    [provider]  Emollient (CERAVE) CREA Apply 1 Application topically as directed. (Apply after washing)    [provider]  ferrous sulfate 325 (65 FE) MG tablet Take 325 mg by mouth in the morning.    [provider]  HYDROcodone-acetaminophen (NORCO/VICODIN) 5-325 MG tablet Take 1-2 tablets by mouth every 4 (four) hours as needed for moderate pain (pain score 4-6). 03/10/22   Duanne Guess, PA-C  levothyroxine (SYNTHROID) 75 MCG tablet Take 75 mcg by mouth daily. 10/02/19   [provider]  metoprolol succinate (TOPROL-XL) 50 MG 24 hr tablet Take 50 mg by mouth in the morning.    [provider]  Multiple Vitamins-Minerals (HEALTHY EYES SUPERVISION 2) CAPS Take 1 capsule by mouth 2 (two) times daily.    [provider]  neomycin-polymyxin b-dexamethasone (MAXITROL) 3.5-10000-0.1 OINT Place 1 Application into both eyes at bedtime.    [provider]  polyethylene glycol (MIRALAX / GLYCOLAX) 17 g packet Take 17 g by mouth daily. 03/14/22   Richarda Osmond, MD  potassium chloride SA (KLOR-CON M) 20 MEQ tablet Take 1 tablet (20 mEq total) by mouth daily for 5 days. 04/12/22 04/17/22  Merlyn Lot, MD  sodium phosphate (FLEET) 7-19 GM/118ML ENEM Place 133 mLs (1 enema total) rectally daily as needed for severe constipation. 03/14/22   Richarda Osmond, MD    Physical Exam: Vitals:   04/14/22 1100 04/14/22 1130 04/14/22 1301 04/14/22 1330  BP: (!) 149/99 (!) 144/96  139/80  Pulse: 95 96  (!) 101  Resp: (!) 39 (!) 21  (!) 34  Temp:   98.5 F (36.9 C)   TempSrc:   Oral   SpO2: 98%  97%  97%   General: Not in acute distress HEENT:       Eyes: PERRL, EOMI, no scleral icterus.       ENT: No discharge from the ears and nose, no pharynx injection, no tonsillar enlargement.        Neck: Difficult to assess JVD due to obesity.  no bruit, no mass felt. Heme: No neck lymph node enlargement. Cardiac: S1/S2, RRR, No murmurs, No gallops or rubs. Respiratory: Crackles on auscultation GI: Soft, nondistended, nontender, no rebound pain, no organomegaly, BS present. GU: No hematuria Ext: has pitting leg edema bilaterally, 2+ in left leg and 1+ in right leg. 1+DP/PT pulse bilaterally. Musculoskeletal: No joint deformities, No joint redness or warmth, no limitation of ROM in spin. Skin: No rashes.  Neuro: Alert, oriented X3, cranial nerves II-XII grossly intact, moves all extremities normally. Psych: Patient is not psychotic, no suicidal or hemocidal ideation.  Labs on Admission: I have personally reviewed following labs and imaging studies  CBC: Recent Labs  Lab 04/12/22 1234 04/14/22 0841  WBC 6.9 7.8  NEUTROABS 4.8 6.8  HGB 11.8* 12.5  HCT 38.6 42.1  MCV 93.0 93.6  PLT 268 643   Basic Metabolic Panel: Recent Labs  Lab 04/12/22 1234 04/12/22 1452 04/14/22 0841  NA 141 143 141  K 2.8* 3.4* 3.7  CL 102 106 106  CO2 '29 31 26  '$ GLUCOSE 111* 107* 150*  BUN '21 20 20  '$ CREATININE 1.42* 1.32* 1.28*  CALCIUM 9.0 8.5* 9.5  MG 2.0  --   --    GFR: Estimated Creatinine Clearance: 27 mL/min (A) (by C-G formula based on SCr of 1.28 mg/dL (H)). Liver Function Tests: Recent Labs  Lab 04/12/22 1234 04/14/22 0841  AST 22 27  ALT 17 17  ALKPHOS 153* 156*  BILITOT 1.2 1.7*  PROT 6.9 7.7  ALBUMIN 3.5 3.8   No results for input(s): "LIPASE", "AMYLASE" in the last 168 hours. No results for input(s): "AMMONIA" in the last 168 hours. Coagulation Profile: No results for input(s): "INR", "PROTIME" in the last 168 hours. Cardiac Enzymes: No results for input(s): "CKTOTAL",  "CKMB", "CKMBINDEX", "TROPONINI" in the last 168 hours. BNP (last 3 results) No results for input(s): "PROBNP" in the last 8760 hours. HbA1C: No results for input(s): "HGBA1C" in the last 72 hours. CBG: No results for input(s): "GLUCAP" in the last 168 hours. Lipid Profile: No results for input(s): "CHOL", "HDL", "LDLCALC", "TRIG", "CHOLHDL", "LDLDIRECT" in the last 72 hours. Thyroid Function Tests: No results for input(s): "TSH", "T4TOTAL", "FREET4", "T3FREE", "THYROIDAB" in the last 72 hours. Anemia Panel: No results for input(s): "VITAMINB12", "FOLATE", "FERRITIN", "TIBC", "IRON", "RETICCTPCT" in the last 72 hours. Urine analysis:    Component Value Date/Time   COLORURINE YELLOW (A) 05/07/2018 1802   APPEARANCEUR CLOUDY (A) 05/07/2018 1802   LABSPEC 1.010 05/07/2018 1802   PHURINE 6.0 05/07/2018 1802   GLUCOSEU NEGATIVE 05/07/2018 1802   HGBUR SMALL (A) 05/07/2018 1802   BILIRUBINUR NEGATIVE 05/07/2018 1802   KETONESUR NEGATIVE 05/07/2018 1802   PROTEINUR NEGATIVE 05/07/2018 1802   NITRITE POSITIVE (A) 05/07/2018 1802   LEUKOCYTESUR LARGE (A) 05/07/2018 1802   Sepsis Labs: '@LABRCNTIP'$ (procalcitonin:4,lacticidven:4) ) Recent Results (from the past 240 hour(s))  Resp panel by RT-PCR (RSV, Flu A&B, Covid) Anterior Nasal Swab     Status: None   Collection Time: 04/12/22 12:34 PM   Specimen: Anterior Nasal Swab  Result Value Ref Range Status   SARS Coronavirus 2 by RT PCR NEGATIVE NEGATIVE Final    Comment: (NOTE) SARS-CoV-2 target nucleic acids are NOT DETECTED.  The SARS-CoV-2 RNA is generally detectable in upper respiratory specimens during the acute phase of infection. The lowest concentration of SARS-CoV-2 viral copies this assay can detect is 138 copies/mL. A negative result does not preclude SARS-Cov-2 infection and should not be used as the sole basis for treatment or other patient management decisions. A negative result may occur with  improper specimen  collection/handling, submission of specimen other than nasopharyngeal swab, presence of viral mutation(s) within the areas targeted by this assay, and inadequate number of viral copies(<138 copies/mL). A negative result must be combined with clinical observations, patient history, and epidemiological information. The expected result is Negative.  Fact Sheet for Patients:  EntrepreneurPulse.com.au  Fact Sheet for Healthcare Providers:  IncredibleEmployment.be  This test is no t yet approved or cleared by the [place] FDA and  has been authorized for detection and/or diagnosis of SARS-CoV-2 by FDA under an Emergency Use Authorization (  EUA). This EUA will remain  in effect (meaning this test can be used) for the duration of the COVID-19 declaration under Section 564(b)(1) of the Act, 21 U.S.C.section 360bbb-3(b)(1), unless the authorization is terminated  or revoked sooner.       Influenza A by PCR NEGATIVE NEGATIVE Final   Influenza B by PCR NEGATIVE NEGATIVE Final    Comment: (NOTE) The Xpert Xpress SARS-CoV-2/FLU/RSV plus assay is intended as an aid in the diagnosis of influenza from Nasopharyngeal swab specimens and should not be used as a sole basis for treatment. Nasal washings and aspirates are unacceptable for Xpert Xpress SARS-CoV-2/FLU/RSV testing.  Fact Sheet for Patients: EntrepreneurPulse.com.au  Fact Sheet for Healthcare Providers: IncredibleEmployment.be  This test is not yet approved or cleared by the [place] FDA and has been authorized for detection and/or diagnosis of SARS-CoV-2 by FDA under an Emergency Use Authorization (EUA). This EUA will remain in effect (meaning this test can be used) for the duration of the COVID-19 declaration under Section 564(b)(1) of the Act, 21 U.S.C. section 360bbb-3(b)(1), unless the authorization is terminated or revoked.     Resp Syncytial  Virus by PCR NEGATIVE NEGATIVE Final    Comment: (NOTE) Fact Sheet for Patients: EntrepreneurPulse.com.au  Fact Sheet for Healthcare Providers: IncredibleEmployment.be  This test is not yet approved or cleared by the [place] FDA and has been authorized for detection and/or diagnosis of SARS-CoV-2 by FDA under an Emergency Use Authorization (EUA). This EUA will remain in effect (meaning this test can be used) for the duration of the COVID-19 declaration under Section 564(b)(1) of the Act, 21 U.S.C. section 360bbb-3(b)(1), unless the authorization is terminated or revoked.  Performed at [facility], [address] [place], [place] [address]   Resp panel by RT-PCR (RSV, Flu A&B, Covid) Anterior Nasal Swab     Status: None   Collection Time: 04/14/22  1:08 PM   Specimen: Anterior Nasal Swab  Result Value Ref Range Status   SARS Coronavirus 2 by RT PCR NEGATIVE NEGATIVE Final   Influenza A by PCR NEGATIVE NEGATIVE Final   Influenza B by PCR NEGATIVE NEGATIVE Final   Resp Syncytial Virus by PCR NEGATIVE NEGATIVE Final    Comment: Performed at [facility], [address] [place], [place] [address]     Radiological Exams on Admission: DG Chest Portable 1 View  Result Date: 04/14/2022 CLINICAL DATA:  Dyspnea. EXAM: PORTABLE CHEST 1 VIEW COMPARISON:  April 12, 2022. FINDINGS: Stable cardiomegaly with mild central pulmonary vascular congestion. Mild bilateral interstitial densities are noted concerning for possible pulmonary edema or atypical inflammation. Bony thorax is unremarkable. IMPRESSION: Mild bilateral diffuse interstitial densities are noted concerning for possible pulmonary edema or atypical inflammation. Stable cardiomegaly with mild central pulmonary vascular congestion. Electronically Signed   By: Marijo Conception M.D.   On: 04/14/2022 09:09   US Venous Img Lower Unilateral Left  Result Date:  04/12/2022 CLINICAL DATA:  88 year old female with history of left lower extremity swelling. EXAM: LEFT LOWER EXTREMITY VENOUS DOPPLER ULTRASOUND TECHNIQUE: Gray-scale sonography with graded compression, as well as color Doppler and duplex ultrasound were performed to evaluate the left lower extremity deep venous systems from the level of the common femoral vein and including the common femoral, femoral, profunda femoral, popliteal and calf veins including the posterior tibial, peroneal and gastrocnemius veins when visible. Spectral Doppler was utilized to evaluate flow at rest and with distal augmentation maneuvers in the common femoral, femoral and popliteal veins.  The contralateral common femoral vein was also evaluated for comparison. COMPARISON:  None Available. FINDINGS: LEFT LOWER EXTREMITY Common Femoral Vein: No evidence of thrombus. Normal compressibility, respiratory phasicity and response to augmentation. Central Greater Saphenous Vein: No evidence of thrombus. Normal compressibility and flow on color Doppler imaging. Central Profunda Femoral Vein: No evidence of thrombus. Normal compressibility and flow on color Doppler imaging. Femoral Vein: No evidence of thrombus. Normal compressibility, respiratory phasicity and response to augmentation. Popliteal Vein: No evidence of thrombus. Normal compressibility, respiratory phasicity and response to augmentation. Calf Veins: No evidence of thrombus. Normal compressibility and flow on color Doppler imaging. Other Findings:  None. RIGHT LOWER EXTREMITY Common Femoral Vein: No evidence of thrombus. Normal compressibility, respiratory phasicity and response to augmentation. IMPRESSION: No evidence of left lower extremity deep venous thrombosis. Ruthann Cancer, MD [facility] Electronically Signed   By: Ruthann Cancer M.D.   On: 04/12/2022 14:53      Assessment/Plan Principal Problem:   Acute on chronic  diastolic CHF (congestive heart failure) (HCC) Active Problems:   Myocardial injury   Paroxysmal atrial fibrillation (HCC)   CKD (chronic kidney disease) stage 4, GFR 15-29 ml/min (HCC)   Type 2 diabetes mellitus with stage 4 chronic kidney disease and hypertension (HCC)   Hypothyroidism   HLD (hyperlipidemia)   HTN (hypertension)   Assessment and Plan:  Acute on chronic diastolic CHF (congestive heart failure) ([place]): 2D echo on 04/19/2018 showed EF of 55-60%.  Patient has leg edema, elevated BNP 585, pulm edema chest x-ray, clinically consistent with CHF exacerbation.   -Will admit to tele as inpatient -Lasix 40 mg bid by IV -trend trop -2d echo -Daily weights -strict I/O's -Low salt diet -Fluid restriction -Obtain REDs Vest reading  Myocardial injury: trop 20 --> 29. -Will not give aspirin since patient is on Eliquis -Trend troponin -Check A1c, FLP -Follow-up 2D echo  Paroxysmal atrial fibrillation (HCC) -Eliquis -Metoprolol 50 mg daily -As needed IV metoprolol 5 mg every 2 hours for heart rate> 125  CKD (chronic kidney disease) stage 4, GFR 15-29 ml/min ([place]): Close to baseline.  Recent baseline creatinine 1.32 on 04/12/2022.  Her creatinine is 1.28, BUN 20, GFR 39 -Follow-up with BMP  Diet-controlled type 2 diabetes mellitus with stage 4 chronic kidney disease and hypertension ([place]): Recent A1c 5.8.  Blood sugar 150. -No treatment needed  Hypothyroidism -Synthroid  HLD (hyperlipidemia): Patient's not taking medications currently -Follow-up FLP  HTN (hypertension) -IV hydralazine as needed -Metoprolol -Hold home oral hydralazine since patient is on IV Lasix    DVT ppx: on Eliquis  Code Status: DNR per pt and her POA at bedside  Family Communication:    Yes, patient's friend who is POA at bed side.    Disposition Plan:  Anticipate discharge back to previous environment, SNF  Consults called:  none  Admission status and Level of care: Telemetry  Cardiac:    as inpt       Dispo: The patient is from: SNF              Anticipated d/c is to: SNF              Anticipated d/c date is: 2 days              Patient currently is not medically stable to d/c.    Severity of Illness:  The appropriate patient status for this patient is INPATIENT. Inpatient status is judged to be reasonable and  necessary in order to provide the required intensity of service to ensure the patient's safety. The patient's presenting symptoms, physical exam findings, and initial radiographic and laboratory data in the context of their chronic comorbidities is felt to place them at high risk for further clinical deterioration. Furthermore, it is not anticipated that the patient will be medically stable for discharge from the hospital within 2 midnights of admission.   * I certify that at the point of admission it is my clinical judgment that the patient will require inpatient hospital care spanning beyond 2 midnights from the point of admission due to high intensity of service, high risk for further deterioration and high frequency of surveillance required.*       Date of Service 04/14/2022    [place] [facility]   If 7PM-7AM, please contact night-coverage [email or web address] 04/14/2022, 2:46 PM

## 2022-04-14 NOTE — Progress Notes (Signed)
*  PRELIMINARY RESULTS* Echocardiogram 2D Echocardiogram has been performed.  Destiny Oliver 04/14/2022, 3:50 PM

## 2022-04-14 NOTE — TOC Initial Note (Signed)
Transition of Care [facility]) - Initial/Assessment Note    Patient Details  Name: Destiny Oliver MRN: [id number] Date of Birth: 22-Dec-1926  [facility] of [facility]) CM/SW Contact:    Shelbie Hutching, RN Phone Number: 04/14/2022, 2:11 PM  Clinical Narrative:                 Patient admitted to the hospital for acute on chronic CHF.  Patient is from [place].  Patient is currently open with [facility] for RN and PT.  Feliberto Harts with [facility] is aware of Admission.    Expected Discharge Plan: [place] Barriers to Discharge: Continued Medical Work up   Patient Goals and CMS Choice        Expected Discharge Plan and Services Expected Discharge Plan: [place]   Discharge Planning Services: CM Consult   Living arrangements for the past 2 months: [place]                                      Prior Living Arrangements/Services Living arrangements for the past 2 months: [place] Lives with:: Self Patient language and need for interpreter reviewed:: No Do you feel safe going back to the place where you live?: Yes      Need for Family Participation in Patient Care: Yes (Comment) Care giver support system in place?: Yes (comment) Current home services: Home PT, Home RN Criminal Activity/Legal Involvement Pertinent to Current Situation/Hospitalization: No - Comment as needed  Activities of Daily Living      Permission Sought/Granted Permission sought to share information with : Case Manager, Family Supports Permission granted to share information with : Yes, Verbal Permission Granted  Share Information with NAME: [name]  Permission granted to share info w AGENCY: [place] granted to share info w Relationship: son  Permission granted to share info w Contact Information: [phone]  Emotional Assessment       Orientation: : Oriented to Self,  Oriented to Place, Oriented to  Time, Oriented to Situation Alcohol / Substance Use: Not Applicable Psych Involvement: No (comment)  Admission diagnosis:  Acute on chronic diastolic CHF (congestive heart failure) (HCC) [I50.33] Acute on chronic systolic CHF (congestive heart failure) ([place]) [I50.23] Patient Active Problem List   Diagnosis Date Noted   Acute on chronic diastolic CHF (congestive heart failure) ([place]) 04/14/2022   HLD (hyperlipidemia) 04/14/2022   HTN (hypertension) 04/14/2022   Myocardial injury 04/14/2022   Acute on chronic systolic CHF (congestive heart failure) ([place]) 04/14/2022   Postprocedural hypotension 03/13/2022   Acute postoperative anemia due to expected blood loss 03/13/2022   Fall 03/10/2022   Left hip pain 03/10/2022   Intertrochanteric fracture of left femur, closed, initial encounter ([place]) 03/09/2022   Type 2 diabetes mellitus ([place]) 03/09/2022   Hypothyroidism 03/09/2022   History of anemia due to CKD 03/09/2022   Dementia without behavioral disturbance ([place]) 03/09/2022   Chronic diastolic CHF (congestive heart failure) ([place]) 10/02/2019   HCAP (healthcare-associated pneumonia) 05/23/2018   UTI due to extended-spectrum beta lactamase (ESBL) producing Escherichia coli 05/15/2018   Diplopia 05/08/2018   Vertigo 05/08/2018   Type 2 diabetes mellitus with stage 4 chronic kidney disease and hypertension ([place]) 05/02/2018   CKD (chronic kidney disease) stage 4, GFR 15-29 ml/min ([place]) 05/02/2018   Hypertensive heart and kidney disease with chronic  diastolic congestive heart failure and stage 4 chronic kidney disease ([place]) 05/02/2018   Acute on chronic diastolic heart failure ([place]) 05/02/2018   Dyslipidemia associated with type 2 diabetes mellitus ([place]) 05/02/2018   Anemia associated with stage 4 chronic renal failure ([place]) 05/02/2018   Acute on chronic respiratory failure with hypoxia ([place]) 05/02/2018   Rhabdomyolysis 04/29/2018   Paroxysmal atrial fibrillation  ([place]) 04/18/2018   PCP:  System, Provider Not In Pharmacy:   [facility], [address] [place] [address] Phone: [phone] Fax: [phone]     Social Determinants of Health (SDOH) Interventions    Readmission Risk Interventions     No data to display

## 2022-04-14 NOTE — ED Provider Notes (Signed)
[facility] Provider Note    Event Date/Time   First MD Initiated Contact with Patient 04/14/22 [id number]     (approximate)   History   Shortness of Breath   HPI  Destiny Oliver is a 88 y.o. female past was of A-fib, CHF, CKD diabetes hypertension hyperlipidemia pulmonary hypertension who presents because of shortness of breath.  Patient tells me that she was getting her's pants on this morning when she became acutely short of breath.  She denies chest pain fevers chills cough.  Has chronic lower extremity edema left greater than right this is normal for her.  Apparently patient was hypoxic on exertion at [facility] and was placed on 3 L by EMS.  Does not typically require home oxygen.  Patient was seen in the ED 2 days ago for dyspnea was in rate controlled A-fib and had reassuring workup and was discharged.   Last EF in 2019 showed normal EF.  Patient did have an admission for hip fracture about a month ago.  Torsemide was DC'd at this time I think because of hypotension in the hospital.  Past Medical History:  Diagnosis Date   Arrhythmia    atrial fibrillation   Breast cancer [facility]) 1994   right breast   CHF (congestive heart failure) ([place])    CKD (chronic kidney disease)    Diabetes mellitus without complication ([place])    Hyperlipidemia    Hypertension    Pulmonary HTN ([place])    Tachycardia     Patient Active Problem List   Diagnosis Date Noted   Acute on chronic diastolic CHF (congestive heart failure) ([place]) 04/14/2022   HLD (hyperlipidemia) 04/14/2022   HTN (hypertension) 04/14/2022   Myocardial injury 04/14/2022   Acute on chronic systolic CHF (congestive heart failure) ([place]) 04/14/2022   Postprocedural hypotension 03/13/2022   Acute postoperative anemia due to expected blood loss 03/13/2022   Fall 03/10/2022   Left hip pain 03/10/2022   Intertrochanteric fracture of left femur, closed, initial encounter ([place]) 03/09/2022   Type 2 diabetes  mellitus ([place]) 03/09/2022   Hypothyroidism 03/09/2022   History of anemia due to CKD 03/09/2022   Dementia without behavioral disturbance ([place]) 03/09/2022   Chronic diastolic CHF (congestive heart failure) ([place]) 10/02/2019   HCAP (healthcare-associated pneumonia) 05/23/2018   UTI due to extended-spectrum beta lactamase (ESBL) producing Escherichia coli 05/15/2018   Diplopia 05/08/2018   Vertigo 05/08/2018   Type 2 diabetes mellitus with stage 4 chronic kidney disease and hypertension ([place]) 05/02/2018   CKD (chronic kidney disease) stage 4, GFR 15-29 ml/min ([place]) 05/02/2018   Hypertensive heart and kidney disease with chronic diastolic congestive heart failure and stage 4 chronic kidney disease ([place]) 05/02/2018   Acute on chronic diastolic heart failure ([place]) 05/02/2018   Dyslipidemia associated with type 2 diabetes mellitus ([place]) 05/02/2018   Anemia associated with stage 4 chronic renal failure ([place]) 05/02/2018   Acute on chronic respiratory failure with hypoxia ([place]) 05/02/2018   Rhabdomyolysis 04/29/2018   Paroxysmal atrial fibrillation ([place]) 04/18/2018     Physical Exam  Triage Vital Signs: ED Triage Vitals  Enc Vitals Group     BP 04/14/22 0830 (!) 147/81     Pulse Rate 04/14/22 0830 (!) 108     Resp 04/14/22 0830 20     Temp 04/14/22 0830 98.7 F (37.1 C)     Temp Source 04/14/22 0830 Oral     SpO2 04/14/22 0824 97 %     Weight --  Height --      Head Circumference --      Peak Flow --      Pain Score 04/14/22 0831 0     Pain Loc --      Pain Edu? --      Excl. in [place]? --     Most recent vital signs: Vitals:   04/14/22 1301 04/14/22 1330  BP:  139/80  Pulse:  (!) 101  Resp:  (!) 34  Temp: 98.5 F (36.9 C)   SpO2:  97%     General: Awake, no distress. CV:  Good peripheral perfusion.  Pitting edema bilaterally left greater than right Resp:  Patient appears dyspneic with speaking, she has some expiratory wheezing but good air movement Abd:  No distention.   Neuro:             Awake, Alert, Oriented x 3  Other:     ED Results / Procedures / Treatments  Labs (all labs ordered are listed, but only abnormal results are displayed) Labs Reviewed  COMPREHENSIVE METABOLIC PANEL - Abnormal; Notable for the following components:      Result Value   Glucose, Bld 150 (*)    Creatinine, Ser 1.28 (*)    Alkaline Phosphatase 156 (*)    Total Bilirubin 1.7 (*)    GFR, Estimated 39 (*)    All other components within normal limits  CBC WITH DIFFERENTIAL/PLATELET - Abnormal; Notable for the following components:   MCHC 29.7 (*)    Lymphs Abs 0.5 (*)    All other components within normal limits  BRAIN NATRIURETIC PEPTIDE - Abnormal; Notable for the following components:   B Natriuretic Peptide 584.5 (*)    All other components within normal limits  TROPONIN I (HIGH SENSITIVITY) - Abnormal; Notable for the following components:   Troponin I (High Sensitivity) 20 (*)    All other components within normal limits  TROPONIN I (HIGH SENSITIVITY) - Abnormal; Notable for the following components:   Troponin I (High Sensitivity) 29 (*)    All other components within normal limits  RESP PANEL BY RT-PCR (RSV, FLU A&B, COVID)  RVPGX2  HEMOGLOBIN A1C  TROPONIN I (HIGH SENSITIVITY)     EKG  EKG shows A-fib, tachycardia, left axis deviation, low voltage   RADIOLOGY Chest x-ray reviewed by myself shows interstitial edema   PROCEDURES:  Critical Care performed: Yes, see critical care procedure note(s)  .1-3 Lead EKG Interpretation  Performed by: Rada Hay, MD Authorized by: Rada Hay, MD     Interpretation: abnormal     ECG rate assessment: tachycardic     Rhythm: atrial fibrillation     Ectopy: none     Conduction: normal   .Critical Care  Performed by: Rada Hay, MD Authorized by: Rada Hay, MD   Critical care provider statement:    Critical care time (minutes):  30   Critical care was time spent  personally by me on the following activities:  Development of treatment plan with patient or surrogate, discussions with consultants, evaluation of patient's response to treatment, examination of patient, ordering and review of laboratory studies, ordering and review of radiographic studies, ordering and performing treatments and interventions, pulse oximetry, re-evaluation of patient's condition and review of old charts   The patient is on the cardiac monitor to evaluate for evidence of arrhythmia and/or significant heart rate changes.   MEDICATIONS ORDERED IN ED: Medications  albuterol (PROVENTIL) (2.5 MG/3ML) 0.083% nebulizer solution 2.5  mg (has no administration in time range)  dextromethorphan-guaiFENesin (MUCINEX DM) 30-600 MG per 12 hr tablet 1 tablet (has no administration in time range)  ondansetron (ZOFRAN) injection 4 mg (has no administration in time range)  acetaminophen (TYLENOL) tablet 650 mg (has no administration in time range)  hydrALAZINE (APRESOLINE) injection 5 mg (has no administration in time range)  metoprolol tartrate (LOPRESSOR) injection 5 mg (has no administration in time range)  metoprolol succinate (TOPROL-XL) 24 hr tablet 50 mg (has no administration in time range)  levothyroxine (SYNTHROID) tablet 75 mcg (has no administration in time range)  senna (SENOKOT) tablet 17.2 mg (has no administration in time range)  polyethylene glycol (MIRALAX / GLYCOLAX) packet 17 g (has no administration in time range)  sodium phosphate (FLEET) 7-19 GM/118ML enema 1 enema (has no administration in time range)  apixaban (ELIQUIS) tablet 2.5 mg (has no administration in time range)  ferrous sulfate tablet 325 mg (has no administration in time range)  cholecalciferol (VITAMIN D3) 25 MCG (1000 UNIT) tablet 2,000 Units (has no administration in time range)  multivitamin-lutein (OCUVITE-LUTEIN) capsule 1 capsule (has no administration in time range)  polyvinyl alcohol (LIQUIFILM TEARS)  1.4 % ophthalmic solution 1 drop (has no administration in time range)  neomycin-polymyxin b-dexamethasone (MAXITROL) ophthalmic ointment 1 Application (has no administration in time range)  furosemide (LASIX) injection 40 mg (has no administration in time range)  furosemide (LASIX) injection 40 mg (40 mg Intravenous Given 04/14/22 1154)     IMPRESSION / MDM / ASSESSMENT AND PLAN / ED COURSE  I reviewed the triage vital signs and the nursing notes.                              Patient's presentation is most consistent with acute presentation with potential threat to life or bodily function.  Differential diagnosis includes, but is not limited to, CHF, pulmonary hypertension exacerbation, volume overload, effusion, pneumonia, ACS  Patient is a 88 year old female with multiple underlying comorbidities who presents because of dyspnea that started when she was getting dressed today.  Apparently she was hypoxic with EMS and was placed on 3 L nasal cannula.  When I evaluated her she is mildly tachycardic in A-fib heart rates in the low 100s, she is satting 99% on 2 L so I turned the oxygen down and she goes down to 94%.  She does have conversational dyspnea and some crackles/wheezing.  She does have pitting edema she states this is chronic.  Will check labs chest x-ray but given this is patient's second ED visit in 3 days and she does look dyspneic anticipate she may need admission.  Patient's chest x-ray shows interstitial edema.  Suspect CHF and pulmonary hypertension contributing to her presentation.  Reviewed patient's recent hospitalization about a month ago for intertrochanteric hip fracture.  Torsemide was DC'd at this time because she was hypotensive and she was started on midodrine.  Consider diagnosis of PE given her tachycardia and hospitalization with fracture however this is ultimately less likely because we have another explanation for her symptoms and she is not having chest pain and she  is already anticoagulated.  Question whether the discontinuation of torsemide is potentially underlying her volume overload.  I discussed the hospitalist who will admit the patient.     FINAL CLINICAL IMPRESSION(S) / ED DIAGNOSES   Final diagnoses:  Acute respiratory failure with hypoxia ([place])     Rx / DC Orders  ED Discharge Orders     None        Note:  This document was prepared using Dragon voice recognition software and may include unintentional dictation errors.   Rada Hay, MD 04/14/22 [phone]

## 2022-04-14 NOTE — ED Triage Notes (Signed)
Pt presents to the ED via [facility] due to dyspnea. Pt appeared dyspnea on exertion. Pt's sats 84% on RA, placed on 3L [place]. Pt does not require O2 at home. Pt does not appear in distress at this time.

## 2022-04-14 NOTE — ED Notes (Signed)
This RN attempted drawing trop off IV line and straight stick unsuccessful. Lab called and will come to draw

## 2022-04-15 DIAGNOSIS — J9601 Acute respiratory failure with hypoxia: Secondary | ICD-10-CM | POA: Diagnosis not present

## 2022-04-15 DIAGNOSIS — N184 Chronic kidney disease, stage 4 (severe): Secondary | ICD-10-CM | POA: Diagnosis not present

## 2022-04-15 DIAGNOSIS — E876 Hypokalemia: Secondary | ICD-10-CM | POA: Insufficient documentation

## 2022-04-15 DIAGNOSIS — I5023 Acute on chronic systolic (congestive) heart failure: Secondary | ICD-10-CM | POA: Diagnosis not present

## 2022-04-15 LAB — LIPID PANEL
Cholesterol: 145 mg/dL (ref 0–200)
HDL: 36 mg/dL — ABNORMAL LOW (ref >40)
LDL Cholesterol: 96 mg/dL (ref 0–99)
Total CHOL/HDL Ratio: 4 RATIO
Triglycerides: 66 mg/dL (ref <150)
VLDL: 13 mg/dL (ref 0–40)

## 2022-04-15 LAB — ECHOCARDIOGRAM COMPLETE
AV Mean grad: 5 mmHg
AV Peak grad: 8.8 mmHg
Ao pk vel: 1.48 m/s
Area-P 1/2: 4.63 cm2
S' Lateral: 3.1 cm

## 2022-04-15 LAB — BASIC METABOLIC PANEL
Anion gap: 8 (ref 5–15)
BUN: 20 mg/dL (ref 8–23)
CO2: 30 mmol/L (ref 22–32)
Calcium: 8.9 mg/dL (ref 8.9–10.3)
Chloride: 102 mmol/L (ref 98–111)
Creatinine, Ser: 1.31 mg/dL — ABNORMAL HIGH (ref 0.44–1.00)
GFR, Estimated: 38 mL/min — ABNORMAL LOW (ref >60)
Glucose, Bld: 108 mg/dL — ABNORMAL HIGH (ref 70–99)
Potassium: 3.4 mmol/L — ABNORMAL LOW (ref 3.5–5.1)
Sodium: 140 mmol/L (ref 135–145)

## 2022-04-15 LAB — MAGNESIUM: Magnesium: 2.1 mg/dL (ref 1.7–2.4)

## 2022-04-15 MED ORDER — POTASSIUM CHLORIDE CRYS ER 20 MEQ PO TBCR
40.0000 meq | EXTENDED_RELEASE_TABLET | Freq: Once | ORAL | Status: AC
  Administered 2022-04-15: 40 meq via ORAL
  Filled 2022-04-15: qty 2

## 2022-04-15 NOTE — Hospital Course (Signed)
Destiny Oliver is a 88 y.o. female with medical history significant of hypertension, hyperlipidemia, diet-controlled diabetes, hypothyroidism, diastolic CHF, pulmonary hypertension, CKD stage IV, atrial fibrillation on Eliquis, breast cancer, iron deficiency anemia, dementia, recent admission due to left hip fracture, who presents with shortness breath.  Her oxygen saturation was 84%, she is requiring 3 L oxygen. Chest x-ray showed pulmonary edema with cardiomegaly, BNP elevated at 585.  Echocardiogram performed at this admission showed ejection fraction 40 to 45%.  Patient with diagnosis of acute on chronic systolic congestive heart failure, started on diuretics.

## 2022-04-15 NOTE — Progress Notes (Signed)
  Progress Note   Patient: Destiny Oliver IRJ:[id number] DOB: 1927-04-21 DOA: 04/14/2022     1 DOS: the patient was seen and examined on 04/15/2022   Brief hospital course: Destiny Oliver is a 88 y.o. female with medical history significant of hypertension, hyperlipidemia, diet-controlled diabetes, hypothyroidism, diastolic CHF, pulmonary hypertension, CKD stage IV, atrial fibrillation on Eliquis, breast cancer, iron deficiency anemia, dementia, recent admission due to left hip fracture, who presents with shortness breath.  Her oxygen saturation was 84%, she is requiring 3 L oxygen. Chest x-ray showed pulmonary edema with cardiomegaly, BNP elevated at 585.  Echocardiogram performed at this admission showed ejection fraction 40 to 45%.  Patient with diagnosis of acute on chronic systolic congestive heart failure, started on diuretics.   Assessment and Plan: Acute on chronic systolic congestive heart failure. Acute hypoxemic respiratory failure secondary to congestive heart failure. Myocardial injury secondary to congestive heart failure. Patient had a significant volume overload time admission, received IV Lasix, appears to be improving.  Will continue current treatment.  She is also on beta-blocker.  Paroxysmal atrial fibrillation Continue Eliquis, continue metoprolol.  Chronic kidney disease stage IV. Continue to follow with diuretics.  Type 2 diabetes. Currently not taking any medications.  Essential hypertension  Continue metoprolol.       Subjective: Patient still on 3 L oxygen.  Short of breath with exertion.  Cough, nonproductive.  Physical Exam: Vitals:   04/15/22 0425 04/15/22 0444 04/15/22 0806 04/15/22 1142  BP: 123/87  (!) 141/89 121/64  Pulse: 92  99 95  Resp: '20  18 17  '$ Temp: 98.4 F (36.9 C)  97.9 F (36.6 C) 97.9 F (36.6 C)  TempSrc: Oral   Oral  SpO2: 100%  98% 98%  Weight:  85.1 kg    Height:       General exam: Appears calm and comfortable   Respiratory system: Few crackles in left base.Marland Kitchen Respiratory effort normal. Cardiovascular system: S1 & S2 heard, RRR. No JVD, murmurs, rubs, gallops or clicks. No pedal edema. Gastrointestinal system: Abdomen is nondistended, soft and nontender. No organomegaly or masses felt. Normal bowel sounds heard. Central nervous system: Alert and oriented x2. No focal neurological deficits. Extremities: Symmetric 5 x 5 power. Skin: No rashes, lesions or ulcers Psychiatry: Judgement and insight appear normal. Mood & affect appropriate.   Data Reviewed:   Lab results reviewed.  Family Communication: son updated  Disposition: Status is: Inpatient Remains inpatient appropriate because: Severity of disease, IV treatment.  Planned Discharge Destination: Home with Home Health    Time spent: 35 minutes  Author: Sharen Hones, MD 04/15/2022 12:51 PM  For on call review [email or web address].

## 2022-04-16 DIAGNOSIS — I5023 Acute on chronic systolic (congestive) heart failure: Secondary | ICD-10-CM | POA: Diagnosis not present

## 2022-04-16 DIAGNOSIS — I480 Paroxysmal atrial fibrillation: Secondary | ICD-10-CM | POA: Diagnosis not present

## 2022-04-16 DIAGNOSIS — J9601 Acute respiratory failure with hypoxia: Secondary | ICD-10-CM | POA: Diagnosis not present

## 2022-04-16 LAB — MAGNESIUM: Magnesium: 2 mg/dL (ref 1.7–2.4)

## 2022-04-16 LAB — BASIC METABOLIC PANEL
Anion gap: 9 (ref 5–15)
BUN: 23 mg/dL (ref 8–23)
CO2: 30 mmol/L (ref 22–32)
Calcium: 8.9 mg/dL (ref 8.9–10.3)
Chloride: 99 mmol/L (ref 98–111)
Creatinine, Ser: 1.26 mg/dL — ABNORMAL HIGH (ref 0.44–1.00)
GFR, Estimated: 40 mL/min — ABNORMAL LOW (ref >60)
Glucose, Bld: 105 mg/dL — ABNORMAL HIGH (ref 70–99)
Potassium: 3.5 mmol/L (ref 3.5–5.1)
Sodium: 138 mmol/L (ref 135–145)

## 2022-04-16 MED ORDER — SENNOSIDES-DOCUSATE SODIUM 8.6-50 MG PO TABS
2.0000 | ORAL_TABLET | Freq: Two times a day (BID) | ORAL | Status: DC
  Administered 2022-04-16 – 2022-04-17 (×3): 2 via ORAL
  Filled 2022-04-16 (×3): qty 2

## 2022-04-16 MED ORDER — ORAL CARE MOUTH RINSE: 15.0000 mL | OROMUCOSAL | Status: DC | PRN

## 2022-04-16 MED ORDER — PROSIGHT PO TABS
1.0000 | ORAL_TABLET | Freq: Two times a day (BID) | ORAL | Status: DC
  Administered 2022-04-16: 1 via ORAL
  Filled 2022-04-16 (×2): qty 1

## 2022-04-16 MED ORDER — POTASSIUM CHLORIDE CRYS ER 20 MEQ PO TBCR
40.0000 meq | EXTENDED_RELEASE_TABLET | Freq: Once | ORAL | Status: AC
  Administered 2022-04-16: 40 meq via ORAL
  Filled 2022-04-16: qty 2

## 2022-04-16 NOTE — Progress Notes (Signed)
  Progress Note   Patient: Destiny Oliver ZOX:[id number] DOB: 1927/03/31 DOA: 04/14/2022     2 DOS: the patient was seen and examined on 04/16/2022   Brief hospital course: MERIT GADSBY is a 88 y.o. female with medical history significant of hypertension, hyperlipidemia, diet-controlled diabetes, hypothyroidism, diastolic CHF, pulmonary hypertension, CKD stage IV, atrial fibrillation on Eliquis, breast cancer, iron deficiency anemia, dementia, recent admission due to left hip fracture, who presents with shortness breath.  Her oxygen saturation was 84%, she is requiring 3 L oxygen. Chest x-ray showed pulmonary edema with cardiomegaly, BNP elevated at 585.  Echocardiogram performed at this admission showed ejection fraction 40 to 45%.  Patient with diagnosis of acute on chronic systolic congestive heart failure, started on diuretics.   Assessment and Plan:  Acute on chronic systolic congestive heart failure. Acute hypoxemic respiratory failure secondary to congestive heart failure. Myocardial injury secondary to congestive heart failure. Patient appears better today, still require 3 L oxygen.  Her lung sounds better.  But still has some trace leg edema.  Continue IV Lasix. Potassium is 3.5, give a dose of oral potassium. Patient feels weak, will start PT/OT.  Patient may need nursing placement.   Paroxysmal atrial fibrillation Continue Eliquis, continue metoprolol.   Chronic kidney disease stage IV. Continue to follow with diuretics.   Type 2 diabetes. Currently not taking any medications.   Essential hypertension  Continue metoprolol.     Subjective: Patient feels better, he slept intermittently.  Short of breath is better, no cough.  Feel constipated.  Physical Exam: Vitals:   04/15/22 1941 04/16/22 0014 04/16/22 0346 04/16/22 0800  BP: 117/66 124/78 119/79 (!) 153/94  Pulse: 88 91 91 98  Resp: '20 18 17 19  '$ Temp: 98.2 F (36.8 C) 98.3 F (36.8 C) (!) 97.3 F (36.3 C) 97.9  F (36.6 C)  TempSrc: Oral Oral Oral   SpO2: 99% 100% 98% 96%  Weight:   82.2 kg   Height:       General exam: Appears calm and comfortable  Respiratory system: No significant crackles today, much improved. Respiratory effort normal. Cardiovascular system: S1 & S2 heard, RRR. No JVD, murmurs, rubs, gallops or clicks. Trace pedal edema. Gastrointestinal system: Abdomen is nondistended, soft and nontender. No organomegaly or masses felt. Normal bowel sounds heard. Central nervous system: Alert and oriented x3. No focal neurological deficits. Extremities: Symmetric 5 x 5 power. Skin: No rashes, lesions or ulcers Psychiatry: Mood & affect appropriate.   Data Reviewed:  Lab results reviewed.  Family Communication: None  Disposition: Status is: Inpatient Remains inpatient appropriate because: Severity of disease, IV treatment.  Planned Discharge Destination: Home with Home Health    Time spent: 35 minutes  Author: Sharen Hones, MD 04/16/2022 10:45 AM  For on call review [email or web address].

## 2022-04-16 NOTE — Evaluation (Signed)
Physical Therapy Evaluation Patient Details Name: Destiny Oliver MRN: [id number] DOB: 1926/10/29 Today's Date: 04/16/2022  History of Present Illness  Destiny Oliver is a 88 y.o. female with medical history significant of hypertension, hyperlipidemia, diet-controlled diabetes, hypothyroidism, diastolic CHF, pulmonary hypertension, CKD stage IV, atrial fibrillation on Eliquis, breast cancer, iron deficiency anemia, dementia, recent admission due to left hip fracture, who presents with shortness breath.  Her oxygen saturation was 84%, she is requiring 3 L oxygen.  Chest x-ray showed pulmonary edema with cardiomegaly, BNP elevated at 585.  Echocardiogram performed at this admission showed ejection fraction 40 to 45%.  Patient with diagnosis of acute on chronic systolic congestive heart failure, started on diuretics.   Clinical Impression  Patient agreeable to PT assessment. She is HOH, has hearing aides, also has low vision. Patient is mod I with bed mobility. Transfers with min guard. Ambulated 5 feet to recliner with min guard and RW. She has slow gait with decreased step length and foot clearance bilaterally. Patient will continue to benefit from skilled PT to progress with strength and mobility to hopefully return home vs SNF at discharge.        Recommendations for follow up therapy are one component of a multi-disciplinary discharge planning process, led by the attending physician.  Recommendations may be updated based on patient status, additional functional criteria and insurance authorization.  Follow Up Recommendations Home health PT      Assistance Recommended at Discharge Frequent or constant Supervision/Assistance  Patient can return home with the following  A little help with walking and/or transfers;A little help with bathing/dressing/bathroom    Equipment Recommendations None recommended by PT  Recommendations for Other Services       Functional Status Assessment Patient has  had a recent decline in their functional status and demonstrates the ability to make significant improvements in function in a reasonable and predictable amount of time.     Precautions / Restrictions Precautions Precautions: Fall Restrictions Weight Bearing Restrictions: No      Mobility  Bed Mobility Overal bed mobility: Modified Independent                  Transfers Overall transfer level: Needs assistance Equipment used: Rolling walker (2 wheels) Transfers: Sit to/from Stand Sit to Stand: Min guard                Ambulation/Gait Ambulation/Gait assistance: Min guard Gait Distance (Feet): 5 Feet Assistive device: Rolling walker (2 wheels) Gait Pattern/deviations: Step-to pattern, Decreased step length - right, Decreased step length - left, Decreased stride length, Trunk flexed Gait velocity: decreased     General Gait Details: slow steady Chief Operating Officer    Modified Rankin (Stroke Patients Only)       Balance Overall balance assessment: Needs assistance, History of Falls Sitting-balance support: Feet supported Sitting balance-Leahy Scale: Good     Standing balance support: Bilateral upper extremity supported, During functional activity, Reliant on assistive device for balance Standing balance-Leahy Scale: Fair Standing balance comment: requires B UE support and min guard for safety                             Pertinent Vitals/Pain Pain Assessment Pain Assessment: No/denies pain    Home Living Family/patient expects to be discharged to:: Assisted living  Home Equipment: Rolling Walker (2 wheels) Additional Comments: using rolling walker, recent hip fx 1 month ago    Prior Function Prior Level of Function : Independent/Modified Independent;History of Falls (last six months)             Mobility Comments: Since fall and recent hip fx patient reports she has been able  to get around her apartment, but they have been taking her down to dining room in [place] Hand: Right    Extremity/Trunk Assessment   Upper Extremity Assessment Upper Extremity Assessment: Defer to OT evaluation    Lower Extremity Assessment Lower Extremity Assessment: Generalized weakness    Cervical / Trunk Assessment Cervical / Trunk Assessment: Normal  Communication   Communication: HOH  Cognition Arousal/Alertness: Awake/alert Behavior During Therapy: WFL for tasks assessed/performed Overall Cognitive Status: Within Functional Limits for tasks assessed                                 General Comments: poor historian, unable to tell me when hip fracture was, she said in september but it was one month ago.        General Comments      Exercises     Assessment/Plan    PT Assessment Patient needs continued PT services  PT Problem List Decreased activity tolerance;Decreased balance;Decreased strength;Decreased mobility;Decreased cognition;Cardiopulmonary status limiting activity       PT Treatment Interventions DME instruction;Therapeutic activities;Gait training;Therapeutic exercise;Functional mobility training;Neuromuscular re-education;Balance training;Patient/family education    PT Goals (Current goals can be found in the Care Plan section)  Acute Rehab PT Goals Patient Stated Goal: to go home if she can. She was recently in rehab for hip fx. She would be willing to go back if needed. PT Goal Formulation: With patient Time For Goal Achievement: 04/30/22 Potential to Achieve Goals: Good    Frequency Min 2X/week     Co-evaluation               AM-PAC PT "6 Clicks" Mobility  Outcome Measure Help needed turning from your back to your side while in a flat bed without using bedrails?: A Little Help needed moving from lying on your back to sitting on the side of a flat bed without using bedrails?: A  Little Help needed moving to and from a bed to a chair (including a wheelchair)?: A Little Help needed standing up from a chair using your arms (e.g., wheelchair or bedside chair)?: A Little Help needed to walk in hospital room?: A Lot Help needed climbing 3-5 steps with a railing? : A Lot 6 Click Score: 16    End of Session Equipment Utilized During Treatment: Gait belt Activity Tolerance: Patient limited by fatigue Patient left: in chair;with call bell/phone within reach;Other (comment) (chair alarm not functional, RN aware said he would get it done.) Nurse Communication: Mobility status PT Visit Diagnosis: Unsteadiness on feet (R26.81);Muscle weakness (generalized) (M62.81);Difficulty in walking, not elsewhere classified (R26.2);History of falling (Z91.81)    Time: 6503-5465 PT Time Calculation (min) (ACUTE ONLY): 20 min   Charges:   PT Evaluation $PT Eval Moderate Complexity: 1 Mod PT Treatments $Gait Training: 8-22 mins        Brenee Gajda, PT, GCS 04/16/22,11:52 AM

## 2022-04-17 DIAGNOSIS — I5023 Acute on chronic systolic (congestive) heart failure: Secondary | ICD-10-CM | POA: Diagnosis not present

## 2022-04-17 DIAGNOSIS — J9601 Acute respiratory failure with hypoxia: Secondary | ICD-10-CM | POA: Diagnosis not present

## 2022-04-17 DIAGNOSIS — N184 Chronic kidney disease, stage 4 (severe): Secondary | ICD-10-CM | POA: Diagnosis not present

## 2022-04-17 LAB — BASIC METABOLIC PANEL
Anion gap: 9 (ref 5–15)
BUN: 29 mg/dL — ABNORMAL HIGH (ref 8–23)
CO2: 30 mmol/L (ref 22–32)
Calcium: 9.4 mg/dL (ref 8.9–10.3)
Chloride: 101 mmol/L (ref 98–111)
Creatinine, Ser: 1.3 mg/dL — ABNORMAL HIGH (ref 0.44–1.00)
GFR, Estimated: 38 mL/min — ABNORMAL LOW (ref >60)
Glucose, Bld: 123 mg/dL — ABNORMAL HIGH (ref 70–99)
Potassium: 3.6 mmol/L (ref 3.5–5.1)
Sodium: 140 mmol/L (ref 135–145)

## 2022-04-17 LAB — MAGNESIUM: Magnesium: 2.2 mg/dL (ref 1.7–2.4)

## 2022-04-17 LAB — HEMOGLOBIN A1C
Hgb A1c MFr Bld: 5.7 % — ABNORMAL HIGH (ref 4.8–5.6)
Mean Plasma Glucose: 117 mg/dL

## 2022-04-17 MED ORDER — TORSEMIDE 40 MG PO TABS: 40.0000 mg | ORAL_TABLET | Freq: Every day | ORAL | 0 refills | Status: AC

## 2022-04-17 NOTE — Discharge Summary (Addendum)
Physician Discharge Summary   Patient: Destiny Oliver MRN: [id number] DOB: March 13, 1927  Admit date:     04/14/2022  Discharge date: 04/17/22  Discharge Physician: Sharen Hones   PCP: System, Provider Not In   Recommendations at discharge:   Follow-up with PCP in 1 week. Check a BMP at next office visit.  Discharge Diagnoses: Active Problems:   Myocardial injury   Paroxysmal atrial fibrillation (HCC)   CKD (chronic kidney disease) stage 4, GFR 15-29 ml/min (HCC)   Type 2 diabetes mellitus with stage 4 chronic kidney disease and hypertension (HCC)   Hypothyroidism   HLD (hyperlipidemia)   HTN (hypertension)   Acute on chronic systolic CHF (congestive heart failure) (HCC)   Hypokalemia   Acute hypoxemic respiratory failure ([place]) Obesity with bmi 34.84 Resolved Problems:   * No resolved hospital problems. *  Hospital Course: Destiny Oliver is a 88 y.o. female with medical history significant of hypertension, hyperlipidemia, diet-controlled diabetes, hypothyroidism, diastolic CHF, pulmonary hypertension, CKD stage IV, atrial fibrillation on Eliquis, breast cancer, iron deficiency anemia, dementia, recent admission due to left hip fracture, who presents with shortness breath.  Her oxygen saturation was 84%, she is requiring 3 L oxygen. Chest x-ray showed pulmonary edema with cardiomegaly, BNP elevated at 585.  Echocardiogram performed at this admission showed ejection fraction 40 to 45%.  Patient with diagnosis of acute on chronic systolic congestive heart failure, started on diuretics.   Assessment and Plan: Acute on chronic systolic congestive heart failure. Acute hypoxemic respiratory failure secondary to congestive heart failure. Myocardial injury secondary to congestive heart failure. Patient was treated with IV Lasix, condition much improved.  Volume status has improved. she is medically stable to be discharged.  Will obtain home oxygen evaluation before discharge.   Paroxysmal  atrial fibrillation Continue Eliquis, continue metoprolol.   Chronic kidney disease stage IV. Renal function still stable.   Type 2 diabetes. Currently not taking any medications.   Essential hypertension  Resume home treatment, discontinued midodrine as patient is no longer has hypotension.       Consultants: None Procedures performed: None  Disposition: Home health Diet recommendation:  Cardiac diet DISCHARGE MEDICATION: Allergies as of 04/17/2022       Reactions   Calcium Channel Blockers    Cephalosporins    Isosorbide Nitrate    Penicillins Swelling        Medication List     STOP taking these medications    HYDROcodone-acetaminophen 5-325 MG tablet Commonly known as: NORCO/VICODIN   midodrine 10 MG tablet Commonly known as: PROAMATINE       TAKE these medications    acetaminophen 325 MG tablet Commonly known as: TYLENOL Take 1-2 tablets (325-650 mg total) by mouth every 6 (six) hours as needed for mild pain (pain score 1-3 or temp > 100.5).   albuterol 108 (90 Base) MCG/ACT inhaler Commonly known as: VENTOLIN HFA Inhale 2 puffs into the lungs every 6 (six) hours as needed for wheezing or shortness of breath.   apixaban 2.5 MG Tabs tablet Commonly known as: ELIQUIS Take 1 tablet (2.5 mg total) by mouth 2 (two) times daily.   CeraVe Crea Apply 1 Application topically as directed. (Apply after washing)   ferrous sulfate 325 (65 FE) MG tablet Take 325 mg by mouth in the morning.   Healthy Eyes Supervision 2 Caps Take 1 capsule by mouth 2 (two) times daily.   hydrALAZINE 50 MG tablet Commonly known as: APRESOLINE Take 50 mg by mouth 4 (  four) times daily.   levothyroxine 75 MCG tablet Commonly known as: SYNTHROID Take 75 mcg by mouth daily.   metoprolol succinate 50 MG 24 hr tablet Commonly known as: TOPROL-XL Take 50 mg by mouth in the morning.   neomycin-polymyxin b-dexamethasone 3.5-10000-0.1 Oint Commonly known as: MAXITROL Place  1 Application into both eyes at bedtime.   polyethylene glycol 17 g packet Commonly known as: MIRALAX / GLYCOLAX Take 17 g by mouth daily.   potassium chloride SA 20 MEQ tablet Commonly known as: KLOR-CON M Take 1 tablet (20 mEq total) by mouth daily for 5 days.   senna 8.6 MG Tabs tablet Commonly known as: SENOKOT Take 2 tablets by mouth in the morning.   sodium phosphate 7-19 GM/118ML Enem Place 133 mLs (1 enema total) rectally daily as needed for severe constipation.   Theratears 0.25 % Soln Generic drug: Carboxymethylcellulose Sodium Place 1 drop into both eyes every 2 (two) hours while awake.   Torsemide 40 MG Tabs Take 40 mg by mouth daily.   Vitamin D 50 MCG (2000 UT) tablet Take 2,000 Units by mouth daily.        Discharge Exam: Filed Weights   04/15/22 0444 04/16/22 0346 04/17/22 0433  Weight: 85.1 kg 82.2 kg 83.6 kg   General exam: Appears calm and comfortable  Respiratory system: Clear to auscultation. Respiratory effort normal. Cardiovascular system: Regular. No JVD, murmurs, rubs, gallops or clicks. No pedal edema. Gastrointestinal system: Abdomen is nondistended, soft and nontender. No organomegaly or masses felt. Normal bowel sounds heard. Central nervous system: Alert and oriented x3. No focal neurological deficits. Extremities: Symmetric 5 x 5 power. Skin: No rashes, lesions or ulcers Psychiatry: . Mood & affect appropriate.    Condition at discharge: good  The results of significant diagnostics from this hospitalization (including imaging, microbiology, ancillary and laboratory) are listed below for reference.   Imaging Studies: ECHOCARDIOGRAM COMPLETE  Result Date: 04/15/2022    ECHOCARDIOGRAM REPORT   Patient Name:   Destiny Oliver Date of Exam: 04/14/2022 Medical Rec #:  [id number]      Height:       61.0 in Accession #:    [id number]     Weight:       186.0 lb Date of Birth:  [birth year]      BSA:          1.831 m Patient Age:    21 years        BP:           144/96 mmHg Patient Gender: F              HR:           103 bpm. Exam Location:  [facility] Procedure: 2D Echo, Color Doppler and Cardiac Doppler Indications:     I50.31 congestive heart failure-Acute Diastolic  History:         Patient has prior history of Echocardiogram examinations, most                  recent 04/19/2018. CHF, CKD; Risk Factors:Hypertension,                  Diabetes and Dyslipidemia.  Sonographer:     Charmayne Sheer Referring Phys:  [id number] Ivor Costa Diagnosing Phys: Yolonda Kida MD IMPRESSIONS  1. Left ventricular ejection fraction, by estimation, is 40 to 45%. The left ventricle has mildly decreased function. The left ventricle demonstrates global hypokinesis. There is mild left ventricular hypertrophy.  Left ventricular diastolic parameters were normal.  2. Right ventricular systolic function is low normal. The right ventricular size is moderately enlarged. Mildly increased right ventricular wall thickness.  3. Left atrial size was mildly dilated.  4. Right atrial size was moderately dilated.  5. The mitral valve is normal in structure. Trivial mitral valve regurgitation.  6. The aortic valve is normal in structure. Aortic valve regurgitation is not visualized. FINDINGS  Left Ventricle: Left ventricular ejection fraction, by estimation, is 40 to 45%. The left ventricle has mildly decreased function. The left ventricle demonstrates global hypokinesis. The left ventricular internal cavity size was normal in size. There is  mild left ventricular hypertrophy. Left ventricular diastolic parameters were normal. Right Ventricle: The right ventricular size is moderately enlarged. Mildly increased right ventricular wall thickness. Right ventricular systolic function is low normal. Left Atrium: Left atrial size was mildly dilated. Right Atrium: Right atrial size was moderately dilated. Pericardium: There is no evidence of pericardial effusion. Mitral Valve: The mitral valve is normal in  structure. Trivial mitral valve regurgitation. Tricuspid Valve: The tricuspid valve is normal in structure. Tricuspid valve regurgitation is trivial. Aortic Valve: The aortic valve is normal in structure. Aortic valve regurgitation is not visualized. Aortic valve mean gradient measures 5.0 mmHg. Aortic valve peak gradient measures 8.8 mmHg. Pulmonic Valve: The pulmonic valve was normal in structure. Pulmonic valve regurgitation is trivial. Aorta: The ascending aorta was not well visualized. IAS/Shunts: No atrial level shunt detected by color flow Doppler.  LEFT VENTRICLE PLAX 2D LVIDd:         3.90 cm Diastology LVIDs:         3.10 cm LV e' medial:    10.60 cm/s LV PW:         1.40 cm LV E/e' medial:  12.3 LV IVS:        0.80 cm LV e' lateral:   12.90 cm/s                        LV E/e' lateral: 10.1  RIGHT VENTRICLE RV Basal diam:  4.00 cm RV Mid diam:    4.20 cm RV S prime:     9.57 cm/s LEFT ATRIUM             Index        RIGHT ATRIUM           Index LA diam:        4.70 cm 2.57 cm/m   RA Area:     21.10 cm LA Vol (A2C):   51.4 ml 28.07 ml/m  RA Volume:   59.50 ml  32.49 ml/m LA Vol (A4C):   51.5 ml 28.12 ml/m LA Biplane Vol: 57.0 ml 31.12 ml/m  AORTIC VALVE                    PULMONIC VALVE AV Vmax:           148.00 cm/s  PV Vmax:       0.89 m/s AV Vmean:          103.000 cm/s PV Vmean:      59.100 cm/s AV VTI:            0.219 m      PV VTI:        0.118 m AV Peak Grad:      8.8 mmHg     PV Peak grad:  3.2 mmHg AV Mean Grad:  5.0 mmHg     PV Mean grad:  2.0 mmHg LVOT Vmax:         83.40 cm/s LVOT Vmean:        62.100 cm/s LVOT VTI:          0.129 m LVOT/AV VTI ratio: 0.59 MITRAL VALVE                TRICUSPID VALVE MV Area (PHT): 4.63 cm     TR Peak grad:   36.7 mmHg MV Decel Time: 164 msec     TR Vmax:        303.00 cm/s MV E velocity: 130.00 cm/s                             SHUNTS                             Systemic VTI: 0.13 m Yolonda Kida MD Electronically signed by Yolonda Kida MD  Signature Date/Time: 04/15/2022/11:26:16 AM    Final    DG Chest Portable 1 View  Result Date: 04/14/2022 CLINICAL DATA:  Dyspnea. EXAM: PORTABLE CHEST 1 VIEW COMPARISON:  April 12, 2022. FINDINGS: Stable cardiomegaly with mild central pulmonary vascular congestion. Mild bilateral interstitial densities are noted concerning for possible pulmonary edema or atypical inflammation. Bony thorax is unremarkable. IMPRESSION: Mild bilateral diffuse interstitial densities are noted concerning for possible pulmonary edema or atypical inflammation. Stable cardiomegaly with mild central pulmonary vascular congestion. Electronically Signed   By: Marijo Conception M.D.   On: 04/14/2022 09:09   US Venous Img Lower Unilateral Left  Result Date: 04/12/2022 CLINICAL DATA:  89 year old female with history of left lower extremity swelling. EXAM: LEFT LOWER EXTREMITY VENOUS DOPPLER ULTRASOUND TECHNIQUE: Gray-scale sonography with graded compression, as well as color Doppler and duplex ultrasound were performed to evaluate the left lower extremity deep venous systems from the level of the common femoral vein and including the common femoral, femoral, profunda femoral, popliteal and calf veins including the posterior tibial, peroneal and gastrocnemius veins when visible. Spectral Doppler was utilized to evaluate flow at rest and with distal augmentation maneuvers in the common femoral, femoral and popliteal veins. The contralateral common femoral vein was also evaluated for comparison. COMPARISON:  None Available. FINDINGS: LEFT LOWER EXTREMITY Common Femoral Vein: No evidence of thrombus. Normal compressibility, respiratory phasicity and response to augmentation. Central Greater Saphenous Vein: No evidence of thrombus. Normal compressibility and flow on color Doppler imaging. Central Profunda Femoral Vein: No evidence of thrombus. Normal compressibility and flow on color Doppler imaging. Femoral Vein: No evidence of thrombus.  Normal compressibility, respiratory phasicity and response to augmentation. Popliteal Vein: No evidence of thrombus. Normal compressibility, respiratory phasicity and response to augmentation. Calf Veins: No evidence of thrombus. Normal compressibility and flow on color Doppler imaging. Other Findings:  None. RIGHT LOWER EXTREMITY Common Femoral Vein: No evidence of thrombus. Normal compressibility, respiratory phasicity and response to augmentation. IMPRESSION: No evidence of left lower extremity deep venous thrombosis. Ruthann Cancer, MD [facility] Electronically Signed   By: Ruthann Cancer M.D.   On: 04/12/2022 14:53   DG Chest 2 View  Result Date: 04/12/2022 CLINICAL DATA:  Shortness of breath EXAM: CHEST - 2 VIEW COMPARISON:  Previous studies including the examination of 03/09/2022 FINDINGS: Transverse diameter of heart is increased. There are no signs of alveolar  pulmonary edema. There is no focal pulmonary consolidation. There is subtle increase in interstitial markings in right upper lung field. There is no significant pleural effusion or pneumothorax. Surgical clips are seen in right axilla. IMPRESSION: Cardiomegaly. There are no signs of pulmonary edema. There is slight prominence of interstitial markings in right upper lung field which may be an artifact due to differences in the techniques or suggest early interstitial pneumonia. Electronically Signed   By: Elmer Picker M.D.   On: 04/12/2022 13:52    Microbiology: Results for orders placed or performed during the hospital encounter of 04/14/22  Resp panel by RT-PCR (RSV, Flu A&B, Covid) Anterior Nasal Swab     Status: None   Collection Time: 04/14/22  1:08 PM   Specimen: Anterior Nasal Swab  Result Value Ref Range Status   SARS Coronavirus 2 by RT PCR NEGATIVE NEGATIVE Final   Influenza A by PCR NEGATIVE NEGATIVE Final   Influenza B by PCR NEGATIVE NEGATIVE Final   Resp  Syncytial Virus by PCR NEGATIVE NEGATIVE Final    Comment: Performed at [facility], [address] [place], [place] [address]    Labs: CBC: Recent Labs  Lab 04/12/22 1234 04/14/22 0841  WBC 6.9 7.8  NEUTROABS 4.8 6.8  HGB 11.8* 12.5  HCT 38.6 42.1  MCV 93.0 93.6  PLT 268 269   Basic Metabolic Panel: Recent Labs  Lab 04/12/22 1234 04/12/22 1452 04/14/22 0841 04/15/22 0411 04/16/22 0411 04/17/22 0326  NA 141 143 141 140 138 140  K 2.8* 3.4* 3.7 3.4* 3.5 3.6  CL 102 106 106 102 99 101  CO2 '29 31 26 30 30 30  '$ GLUCOSE 111* 107* 150* 108* 105* 123*  BUN '21 20 20 20 23 '$ 29*  CREATININE 1.42* 1.32* 1.28* 1.31* 1.26* 1.30*  CALCIUM 9.0 8.5* 9.5 8.9 8.9 9.4  MG 2.0  --   --  2.1 2.0 2.2   Liver Function Tests: Recent Labs  Lab 04/12/22 1234 04/14/22 0841  AST 22 27  ALT 17 17  ALKPHOS 153* 156*  BILITOT 1.2 1.7*  PROT 6.9 7.7  ALBUMIN 3.5 3.8   CBG: No results for input(s): "GLUCAP" in the last 168 hours.  Discharge time spent: greater than 30 minutes.  Signed: Sharen Hones, MD [facility] 04/17/2022

## 2022-04-17 NOTE — Progress Notes (Signed)
Physical Therapy Treatment Patient Details Name: Destiny Oliver MRN: [id number] DOB: Sep 01, 1926 Today's Date: 04/17/2022   History of Present Illness Destiny Oliver is a 88 y.o. female with medical history significant of hypertension, hyperlipidemia, diet-controlled diabetes, hypothyroidism, diastolic CHF, pulmonary hypertension, CKD stage IV, atrial fibrillation on Eliquis, breast cancer, iron deficiency anemia, dementia, recent admission due to left hip fracture, who presents with shortness breath.  Her oxygen saturation was 84%, she is requiring 3 L oxygen.  Chest x-ray showed pulmonary edema with cardiomegaly, BNP elevated at 585.  Echocardiogram performed at this admission showed ejection fraction 40 to 45%.  Patient with diagnosis of acute on chronic systolic congestive heart failure, started on diuretics.    PT Comments    Pt received upright in bed agreeable to PT with visitors present. Pt resting on 2L/min with SPO2 > 90% at rest and post ambulation. Pt grossly mod-I with bed mobility with bed features and increased time.  Pt standing with minguard to RW with VC's for hand placement and side stepping at EOB due to lack of space. Pt totaling 40' of gait with slowed, but consistent gait cadence with RW. Notable BUE weightbearing in LE's indicative of LE weakness with x2 standing rest breaks needed. Pt returning to supine in bed with all needs in reach. Encouraged pt to utilize EMS transport due to fatigue and LE weakness and to assist in managing home equipment. Pt and HCPOA in agreement. All needs in reach with d/c recs remaining appropriate.    Recommendations for follow up therapy are one component of a multi-disciplinary discharge planning process, led by the attending physician.  Recommendations may be updated based on patient status, additional functional criteria and insurance authorization.  Follow Up Recommendations  Home health PT     Assistance Recommended at Discharge Frequent  or constant Supervision/Assistance  Patient can return home with the following A little help with walking and/or transfers;A little help with bathing/dressing/bathroom   Equipment Recommendations  None recommended by PT    Recommendations for Other Services       Precautions / Restrictions Precautions Precautions: Fall Restrictions Weight Bearing Restrictions: No     Mobility  Bed Mobility Overal bed mobility: Modified Independent             General bed mobility comments: increased time and effort Patient Response: Cooperative  Transfers Overall transfer level: Needs assistance Equipment used: Rolling walker (2 wheels) Transfers: Sit to/from Stand Sit to Stand: Min guard                Ambulation/Gait Ambulation/Gait assistance: Min guard Gait Distance (Feet): 40 Feet Assistive device: Rolling walker (2 wheels) Gait Pattern/deviations: Step-to pattern, Decreased step length - right, Decreased step length - left, Decreased stride length, Trunk flexed       General Gait Details: slow steady Nature conservation officer    Modified Rankin (Stroke Patients Only)       Balance Overall balance assessment: Needs assistance, History of Falls Sitting-balance support: Feet supported       Standing balance support: Bilateral upper extremity supported, During functional activity, Reliant on assistive device for balance Standing balance-Leahy Scale: Fair Standing balance comment: BUE support on RW                            Cognition Arousal/Alertness: Awake/alert Behavior During Therapy: [facility] for  tasks assessed/performed Overall Cognitive Status: Within Functional Limits for tasks assessed                                          Exercises      General Comments General comments (skin integrity, edema, etc.): SPO2 in upper 90's on 2L/min throughout session      Pertinent Vitals/Pain Pain  Assessment Pain Assessment: No/denies pain    Home Living Family/patient expects to be discharged to:: Assisted living                 Home Equipment: Rolling Walker (2 wheels);Wheelchair - manual;Grab bars - tub/shower;Grab bars - toilet Additional Comments: using rolling walker, recent hip fx 1 month ago    Prior Function            PT Goals (current goals can now be found in the care plan section) Acute Rehab PT Goals Patient Stated Goal: to go home if she can. She was recently in rehab for hip fx. She would be willing to go back if needed. PT Goal Formulation: With patient Time For Goal Achievement: 04/30/22 Potential to Achieve Goals: Good Progress towards PT goals: Progressing toward goals    Frequency    Min 2X/week      PT Plan      Co-evaluation              AM-PAC PT "6 Clicks" Mobility   Outcome Measure  Help needed turning from your back to your side while in a flat bed without using bedrails?: A Little Help needed moving from lying on your back to sitting on the side of a flat bed without using bedrails?: A Little Help needed moving to and from a bed to a chair (including a wheelchair)?: A Little Help needed standing up from a chair using your arms (e.g., wheelchair or bedside chair)?: A Little Help needed to walk in hospital room?: A Lot Help needed climbing 3-5 steps with a railing? : A Lot 6 Click Score: 16    End of Session Equipment Utilized During Treatment: Gait belt Activity Tolerance: Patient tolerated treatment well;Patient limited by fatigue Patient left: in bed;with call bell/phone within reach;with bed alarm set;with family/visitor present   PT Visit Diagnosis: Unsteadiness on feet (R26.81);Muscle weakness (generalized) (M62.81);Difficulty in walking, not elsewhere classified (R26.2);History of falling (Z91.81)     Time: 3220-2542 PT Time Calculation (min) (ACUTE ONLY): 24 min  Charges:  $Therapeutic Activity: 23-37  mins                    Destiny Oliver M. Fairly IV, PT, DPT Physical Therapist- [facility]  04/17/2022, 3:33 PM

## 2022-04-17 NOTE — Care Management Important Message (Signed)
Important Message  Patient Details  Name: Destiny Oliver MRN: [id number] Date of Birth: 1926/11/25   Medicare Important Message Given:  Yes     [name] 04/17/2022, 12:01 PM

## 2022-04-17 NOTE — Plan of Care (Signed)

## 2022-04-17 NOTE — TOC Progression Note (Addendum)
Transition of Care [facility]) - Progression Note    Patient Details  Name: Destiny Oliver MRN: [id number] Date of Birth: [birth year]  [facility] of [facility] [place] CM/SW [place], LCSW Phone Number: 04/17/2022, 12:04 PM  Clinical Narrative:   Per patient's friend/HCPOA, patient is from the ALF side at [facility]. Left voicemail for Vaughan Basta at the facility to confirm. No preference for oxygen company. Blanca Friend is aware of potential discharge today and can resume services tomorrow.  12:37 pm: Confirmed patient is from ALF side. Preferred oxygen company is [employer]. Ordered oxygen. PT will determine if she needs EMS transport back to facility or not. Faxed FL2 and discharge summary to facility.  2:11 pm: Oxygen delivered to the room.  2:38 pm: Faxed signed FL2 to ALF.  3:08 pm: Oxygen setup will be at ALF in 30-45 minutes. Asked ALF staff member to call me when it is ready so EMS transport can be arranged.  3:53 pm: Per RN, after receiving report information, Ed Weeks from [facility] [employer] will be here around 4:00 to assess patient.  Expected Discharge Plan: [place] Barriers to Discharge: Continued Medical Work up  Expected Discharge Plan and Services Expected Discharge Plan: [place]   Discharge Planning Services: CM Consult   Living arrangements for the past 2 months: [place]                                       Social Determinants of Health (SDOH) Interventions    Readmission Risk Interventions     No data to display

## 2022-04-17 NOTE — Discharge Instructions (Signed)

## 2022-04-17 NOTE — Evaluation (Signed)
Occupational Therapy Evaluation Patient Details Name: DANAE OLAND MRN: [id number] DOB: 1926/11/06 Today's Date: 04/17/2022   History of Present Illness PARTHENA FERGESON is a 88 y.o. female with medical history significant of hypertension, hyperlipidemia, diet-controlled diabetes, hypothyroidism, diastolic CHF, pulmonary hypertension, CKD stage IV, atrial fibrillation on Eliquis, breast cancer, iron deficiency anemia, dementia, recent admission due to left hip fracture, who presents with shortness breath.  Her oxygen saturation was 84%, she is requiring 3 L oxygen.  Chest x-ray showed pulmonary edema with cardiomegaly, BNP elevated at 585.  Echocardiogram performed at this admission showed ejection fraction 40 to 45%.  Patient with diagnosis of acute on chronic systolic congestive heart failure, started on diuretics.   Clinical Impression   Ms. Longstreth presents with generalized weakness, limited endurance, and SOB. She comes to [facility] from [place]. During today's evaluation, she is Mod I with bed mobility, transfers w/ RW and SUPV for safety, able to ambulate in room with RW and good balance, CGA for safety, but does become SOB with OOB mobility. Provided educ re: activities modifications, falls prevention, ECS, with pt verbalizing understanding. Pt wears a emergency alert button at all times and states that she can receive assistance PRN at her ALF. Recommend DC back to ALF with HHOT.    Recommendations for follow up therapy are one component of a multi-disciplinary discharge planning process, led by the attending physician.  Recommendations may be updated based on patient status, additional functional criteria and insurance authorization.   Follow Up Recommendations  Home health OT     Assistance Recommended at Discharge Intermittent Supervision/Assistance  Patient can return home with the following A little help with walking and/or transfers;A little help with  bathing/dressing/bathroom;Assistance with cooking/housework;Assist for transportation    Functional Status Assessment  Patient has had a recent decline in their functional status and demonstrates the ability to make significant improvements in function in a reasonable and predictable amount of time.  Equipment Recommendations  None recommended by OT    Recommendations for Other Services       Precautions / Restrictions Precautions Precautions: Fall Restrictions Weight Bearing Restrictions: No      Mobility Bed Mobility Overal bed mobility: Modified Independent             General bed mobility comments: increased time and effort    Transfers Overall transfer level: Needs assistance Equipment used: Rolling walker (2 wheels) Transfers: Sit to/from Stand Sit to Stand: Supervision                  Balance Overall balance assessment: Needs assistance, History of Falls Sitting-balance support: Feet supported Sitting balance-Leahy Scale: Good     Standing balance support: Bilateral upper extremity supported, During functional activity, Reliant on assistive device for balance Standing balance-Leahy Scale: Good Standing balance comment: requires B UE support and min guard for safety                           ADL either performed or assessed with clinical judgement   ADL Overall ADL's : Needs assistance/impaired Eating/Feeding: Modified independent   Grooming: Wash/dry hands;Wash/dry face;Sitting           Upper Body Dressing : Supervision/safety;Sitting       Toilet Transfer: Research officer, trade union  Praxis      Pertinent Vitals/Pain Pain Assessment Pain Assessment: No/denies pain     Hand Dominance Right   Extremity/Trunk Assessment Upper Extremity Assessment Upper Extremity Assessment: Overall WFL for tasks assessed       Cervical / Trunk Assessment Cervical / Trunk  Assessment: Normal   Communication Communication Communication: HOH   Cognition Arousal/Alertness: Awake/alert Behavior During Therapy: WFL for tasks assessed/performed Overall Cognitive Status: Within Functional Limits for tasks assessed                                 General Comments: generally oriented and alert, slight confusion as to time and medical hx     General Comments       Exercises Other Exercises Other Exercises: Educ re: falls prevention, activity modification, ECS   Shoulder Instructions      Home Living Family/patient expects to be discharged to:: Assisted living                             Home Equipment: Rolling Walker (2 wheels);Wheelchair - manual;Grab bars - tub/shower;Grab bars - toilet   Additional Comments: using rolling walker, recent hip fx 1 month ago      Prior Functioning/Environment Prior Level of Function : Independent/Modified Independent;History of Falls (last six months)             Mobility Comments: Since fall and recent hip fx patient reports she has been able to get around her apartment, but they have been taking her down to dining room in W/C ADLs Comments: Pt reports dressing independently at ALF. Has been taking on sponge baths since hip fx.        OT Problem List: Decreased strength;Decreased activity tolerance;Impaired balance (sitting and/or standing);Cardiopulmonary status limiting activity;Increased edema      OT Treatment/Interventions:      OT Goals(Current goals can be found in the care plan section) Acute Rehab OT Goals Patient Stated Goal: to go home OT Goal Formulation: With patient Time For Goal Achievement: 05/01/22 Potential to Achieve Goals: Good  OT Frequency:      Co-evaluation              AM-PAC OT "6 Clicks" Daily Activity     Outcome Measure Help from another person eating meals?: None Help from another person taking care of personal grooming?: A Little Help  from another person toileting, which includes using toliet, bedpan, or urinal?: A Little Help from another person bathing (including washing, rinsing, drying)?: A Little Help from another person to put on and taking off regular upper body clothing?: None Help from another person to put on and taking off regular lower body clothing?: A Little 6 Click Score: 20   End of Session Equipment Utilized During Treatment: Rolling walker (2 wheels)  Activity Tolerance: Patient tolerated treatment well Patient left: in bed;with call bell/phone within reach;with family/visitor present  OT Visit Diagnosis: Unsteadiness on feet (R26.81);Muscle weakness (generalized) (M62.81)                Time: 6578-4696 OT Time Calculation (min): 18 min Charges:  OT General Charges $OT Visit: 1 Visit OT Evaluation $OT Eval Low Complexity: 1 Low OT Treatments $Self Care/Home Management : 8-22 mins Josiah Lobo, PhD, MS, OTR/L 04/17/22, 12:36 PM

## 2022-04-17 NOTE — Progress Notes (Signed)
SATURATION QUALIFICATIONS: (This note is used to comply with regulatory documentation for home oxygen)  Patient Saturations on Room Air at Rest = 94%  Patient Saturations on Room Air while Ambulating = 86%  Patient Saturations on 2 Liters of oxygen while Ambulating = 96%  Please briefly explain why patient needs home oxygen: Needed 2LNC to maintain a O2 saturation above 88%

## 2022-04-17 NOTE — Progress Notes (Signed)
PT Cancellation Note  Patient Details Name: Destiny Oliver MRN: [id number] DOB: 07-Mar-1927   Cancelled Treatment:    Reason Eval/Treat Not Completed: Fatigue/lethargy limiting ability to participate. Pt received returning to supine in bed with RN. Pt declining OOB efforts as she just got out of bed. Reports SOB after SPO2 test to determine need for home O2. Requesting PT to return. Pt will return at a later time/date as able.    Salem Caster. Fairly IV, PT, DPT Physical Therapist- [facility]  04/17/2022, 10:58 AM

## 2022-04-17 NOTE — NC FL2 (Signed)
[place] LEVEL OF CARE FORM     IDENTIFICATION  Patient Name: Destiny Oliver Birthdate: 1927-03-10 Sex: female Admission Date (Current Location): 04/14/2022  [facility] and [place] Number:  Engineering geologist and Address:  [facility], [address] [place], [place] [address]      Provider Number: [id number]  Attending Physician Name and Address:  Sharen Hones, MD  Relative Name and Phone Number:       Current Level of Care: Hospital Recommended Level of Care: [place] (with PT, OT, RN through [facility]) Prior Approval Number:    Date Approved/Denied:   PASRR Number:    Discharge Plan: Other (Comment) (ALF with PT, OT, RN through [facility])    Current Diagnoses: Patient Active Problem List   Diagnosis Date Noted   Hypokalemia 04/15/2022   Acute hypoxemic respiratory failure ([place]) 04/15/2022   HLD (hyperlipidemia) 04/14/2022   HTN (hypertension) 04/14/2022   Myocardial injury 04/14/2022   Acute on chronic systolic CHF (congestive heart failure) ([place]) 04/14/2022   Postprocedural hypotension 03/13/2022   Acute postoperative anemia due to expected blood loss 03/13/2022   Fall 03/10/2022   Left hip pain 03/10/2022   Intertrochanteric fracture of left femur, closed, initial encounter ([place]) 03/09/2022   Type 2 diabetes mellitus ([place]) 03/09/2022   Hypothyroidism 03/09/2022   History of anemia due to CKD 03/09/2022   Dementia without behavioral disturbance ([place]) 03/09/2022   Chronic diastolic CHF (congestive heart failure) ([place]) 10/02/2019   HCAP (healthcare-associated pneumonia) 05/23/2018   UTI due to extended-spectrum beta lactamase (ESBL) producing Escherichia coli 05/15/2018   Diplopia 05/08/2018   Vertigo 05/08/2018   Type 2 diabetes mellitus with stage 4 chronic kidney disease and hypertension ([place]) 05/02/2018   CKD (chronic kidney disease) stage 4, GFR 15-29 ml/min (HCC) 05/02/2018    Hypertensive heart and kidney disease with chronic diastolic congestive heart failure and stage 4 chronic kidney disease ([place]) 05/02/2018   Acute on chronic diastolic heart failure ([place]) 05/02/2018   Dyslipidemia associated with type 2 diabetes mellitus ([place]) 05/02/2018   Anemia associated with stage 4 chronic renal failure ([place]) 05/02/2018   Acute on chronic respiratory failure with hypoxia ([place]) 05/02/2018   Rhabdomyolysis 04/29/2018   Paroxysmal atrial fibrillation ([place]) 04/18/2018    Orientation RESPIRATION BLADDER Height & Weight     Self, Time, Situation, Place  O2 (Nasal Cannula 2 L) Incontinent Weight: 184 lb 6.4 oz (83.6 kg) Height:  '5\' 1"'$  (154.9 cm)  BEHAVIORAL SYMPTOMS/MOOD NEUROLOGICAL BOWEL NUTRITION STATUS   (None)   Incontinent Diet (Cardiac)  AMBULATORY STATUS COMMUNICATION OF NEEDS Skin   Limited Assist Verbally Normal                       Personal Care Assistance Level of Assistance              Functional Limitations Info  Sight, Hearing, Speech Sight Info: Adequate Hearing Info: Adequate Speech Info: Adequate    SPECIAL CARE FACTORS FREQUENCY  PT (By licensed PT), OT (By licensed OT)     PT Frequency: 3 x week OT Frequency: 3 x week            Contractures Contractures Info: Not present    Additional Factors Info  Code Status, Allergies Code Status Info: DNR Allergies Info: Calcium Channel Blockers, Cephalosporins, Isosorbide Nitrate, Penicillins           Current Medications (04/17/2022):  This is  the current hospital active medication list Current Facility-Administered Medications  Medication Dose Route Frequency Provider Last Rate Last Admin   acetaminophen (TYLENOL) tablet 650 mg  650 mg Oral Q6H PRN Ivor Costa, MD       albuterol (PROVENTIL) (2.5 MG/3ML) 0.083% nebulizer solution 2.5 mg  2.5 mg Inhalation Q4H PRN Ivor Costa, MD       apixaban Arne [place]) tablet 2.5 mg  2.5 mg Oral BID Ivor Costa, MD   2.5 mg at 04/17/22 9702    cholecalciferol (VITAMIN D3) 25 MCG (1000 UNIT) tablet 2,000 Units  2,000 Units Oral Daily Ivor Costa, MD   2,000 Units at 04/17/22 0851   dextromethorphan-guaiFENesin ([place] DM) 30-600 MG per 12 hr tablet 1 tablet  1 tablet Oral BID PRN Ivor Costa, MD       ferrous sulfate tablet 325 mg  325 mg Oral q AM Ivor Costa, MD   325 mg at 04/17/22 0741   furosemide (LASIX) injection 40 mg  40 mg Intravenous Q12H Ivor Costa, MD   40 mg at 04/17/22 0741   hydrALAZINE (APRESOLINE) injection 5 mg  5 mg Intravenous Q2H PRN Ivor Costa, MD       levothyroxine (SYNTHROID) tablet 75 mcg  75 mcg Oral Daily Ivor Costa, MD   75 mcg at 04/17/22 0741   metoprolol succinate (TOPROL-XL) 24 hr tablet 50 mg  50 mg Oral q AM Ivor Costa, MD   50 mg at 04/17/22 0741   metoprolol tartrate (LOPRESSOR) injection 5 mg  5 mg Intravenous Q2H PRN Ivor Costa, MD       multivitamin (PROSIGHT) tablet 1 tablet  1 tablet Oral BID Renda Rolls, RPH   1 tablet at 04/16/22 6378   neomycin-polymyxin b-dexamethasone (MAXITROL) ophthalmic ointment 1 Application  1 Application Both Eyes QHS Ivor Costa, MD   1 Application at 58/85/02 2029   ondansetron (ZOFRAN) injection 4 mg  4 mg Intravenous Q8H PRN Ivor Costa, MD       Oral care mouth rinse  15 mL Mouth Rinse PRN Sharen Hones, MD       polyethylene glycol (MIRALAX / GLYCOLAX) packet 17 g  17 g Oral Daily PRN Ivor Costa, MD       polyvinyl alcohol (LIQUIFILM TEARS) 1.4 % ophthalmic solution 1 drop  1 drop Both Eyes Q2H while awake Ivor Costa, MD   1 drop at 04/17/22 1100   senna-docusate (Senokot-S) tablet 2 tablet  2 tablet Oral BID Sharen Hones, MD   2 tablet at 04/17/22 0851   sodium phosphate (FLEET) 7-19 GM/118ML enema 1 enema  1 enema Rectal Daily PRN Ivor Costa, MD         Discharge Medications: STOP taking these medications     HYDROcodone-acetaminophen 5-325 MG tablet Commonly known as: NORCO/VICODIN    midodrine 10 MG tablet Commonly known as: PROAMATINE            TAKE these medications     acetaminophen 325 MG tablet Commonly known as: TYLENOL Take 1-2 tablets (325-650 mg total) by mouth every 6 (six) hours as needed for mild pain (pain score 1-3 or temp > 100.5).    albuterol 108 (90 Base) MCG/ACT inhaler Commonly known as: VENTOLIN HFA Inhale 2 puffs into the lungs every 6 (six) hours as needed for wheezing or shortness of breath.    apixaban 2.5 MG Tabs tablet Commonly known as: ELIQUIS Take 1 tablet (2.5 mg total) by mouth 2 (two) times daily.  CeraVe Crea Apply 1 Application topically as directed. (Apply after washing)    ferrous sulfate 325 (65 FE) MG tablet Take 325 mg by mouth in the morning.    Healthy Eyes Supervision 2 Caps Take 1 capsule by mouth 2 (two) times daily.    hydrALAZINE 50 MG tablet Commonly known as: APRESOLINE Take 50 mg by mouth 4 (four) times daily.    levothyroxine 75 MCG tablet Commonly known as: SYNTHROID Take 75 mcg by mouth daily.    metoprolol succinate 50 MG 24 hr tablet Commonly known as: TOPROL-XL Take 50 mg by mouth in the morning.    neomycin-polymyxin b-dexamethasone 3.5-10000-0.1 Oint Commonly known as: MAXITROL Place 1 Application into both eyes at bedtime.    polyethylene glycol 17 g packet Commonly known as: MIRALAX / GLYCOLAX Take 17 g by mouth daily.    potassium chloride SA 20 MEQ tablet Commonly known as: KLOR-CON M Take 1 tablet (20 mEq total) by mouth daily for 5 days.    senna 8.6 MG Tabs tablet Commonly known as: SENOKOT Take 2 tablets by mouth in the morning.    sodium phosphate 7-19 GM/118ML Enem Place 133 mLs (1 enema total) rectally daily as needed for severe constipation.    Theratears 0.25 % Soln Generic drug: Carboxymethylcellulose Sodium Place 1 drop into both eyes every 2 (two) hours while awake.    Torsemide 40 MG Tabs Take 40 mg by mouth daily.    Vitamin D 50 MCG (2000 UT) tablet Take 2,000 Units by mouth daily.    Relevant Imaging  Results:  Relevant Lab Results:   Additional Information SS#: [id number]  Candie Chroman, LCSW

## 2022-04-17 NOTE — TOC Transition Note (Signed)
Transition of Care [facility]) - CM/SW Discharge Note   Patient Details  Name: Destiny Oliver MRN: [id number] Date of Birth: [birth year]  Transition of Care [facility]) CM/SW Contact:  Candie Chroman, LCSW Phone Number: 04/17/2022, 4:49 PM   Clinical Narrative:   Patient has orders to discharge back to [facility] today. Oxygen set up at the facility is complete. [facility] will resume services tomorrow morning. RN has already called report. EMS transport has been arranged and she is 1st on the list. No further concerns. CSW signing off.  Final next level of care: Assisted Living (with home health) Barriers to Discharge: Barriers Resolved   Patient Goals and CMS Choice     Choice offered to / list presented to : Patient, [facility] POA / Guardian  Discharge Placement                Patient to be transferred to facility by: EMS Name of family member notified: [place] Agent Patient and family notified of of transfer: 04/17/22  Discharge Plan and Services   Discharge Planning Services: CM Consult            DME Arranged: Oxygen DME Agency: [facility] Date DME Agency Contacted: 04/17/22   Representative spoke with at DME Agency: [place] Arranged: RN, PT, OT [facility] Agency: [place] Date [place]: 04/17/22   Representative spoke with at [place]: [place] Determinants of Health (SDOH) Interventions     Readmission Risk Interventions     No data to display

## 2022-05-02 ENCOUNTER — Encounter: Payer: Medicare Other | Admitting: Family

## 2022-09-19 ENCOUNTER — Emergency Department: Payer: Medicare Other

## 2022-09-19 ENCOUNTER — Emergency Department
Admission: EM | Admit: 2022-09-19 | Discharge: 2022-09-20 | Disposition: A | Discharge status: Home / Self Care | Payer: Medicare Other | Attending: Emergency Medicine | Admitting: Emergency Medicine

## 2022-09-19 DIAGNOSIS — S60414A Abrasion of right ring finger, initial encounter: Secondary | ICD-10-CM | POA: Insufficient documentation

## 2022-09-19 DIAGNOSIS — W19XXXA Unspecified fall, initial encounter: Secondary | ICD-10-CM | POA: Diagnosis not present

## 2022-09-19 DIAGNOSIS — S0083XA Contusion of other part of head, initial encounter: Secondary | ICD-10-CM | POA: Diagnosis not present

## 2022-09-19 DIAGNOSIS — Z7901 Long term (current) use of anticoagulants: Secondary | ICD-10-CM | POA: Insufficient documentation

## 2022-09-19 DIAGNOSIS — E039 Hypothyroidism, unspecified: Secondary | ICD-10-CM | POA: Diagnosis not present

## 2022-09-19 DIAGNOSIS — S52124A Nondisplaced fracture of head of right radius, initial encounter for closed fracture: Secondary | ICD-10-CM | POA: Insufficient documentation

## 2022-09-19 DIAGNOSIS — S022XXA Fracture of nasal bones, initial encounter for closed fracture: Secondary | ICD-10-CM | POA: Diagnosis not present

## 2022-09-19 DIAGNOSIS — Z23 Encounter for immunization: Secondary | ICD-10-CM | POA: Diagnosis not present

## 2022-09-19 DIAGNOSIS — S6991XA Unspecified injury of right wrist, hand and finger(s), initial encounter: Secondary | ICD-10-CM | POA: Diagnosis present

## 2022-09-19 DIAGNOSIS — M25521 Pain in right elbow: Secondary | ICD-10-CM | POA: Insufficient documentation

## 2022-09-19 DIAGNOSIS — I10 Essential (primary) hypertension: Secondary | ICD-10-CM | POA: Insufficient documentation

## 2022-09-19 DIAGNOSIS — S60419A Abrasion of unspecified finger, initial encounter: Secondary | ICD-10-CM

## 2022-09-19 MED ORDER — TETANUS-DIPHTH-ACELL PERTUSSIS 5-2.5-18.5 LF-MCG/0.5 IM SUSY
0.5000 mL | PREFILLED_SYRINGE | Freq: Once | INTRAMUSCULAR | Status: AC
  Administered 2022-09-19: 0.5 mL via INTRAMUSCULAR
  Filled 2022-09-19: qty 0.5

## 2022-09-19 MED ORDER — ACETAMINOPHEN 500 MG PO TABS
1000.0000 mg | ORAL_TABLET | Freq: Once | ORAL | Status: AC
  Administered 2022-09-19: 1000 mg via ORAL
  Filled 2022-09-19: qty 2

## 2022-09-19 NOTE — ED Provider Triage Note (Signed)
Emergency Medicine Provider Triage Evaluation Note  Destiny Oliver , a 89 y.o. female  was evaluated in triage.  Pt complains of mechanical fall, bent over to pick up somethng and fell and hit face. No loc. C/o headache, nose pain with lac, R hand pain with lac, R elbow pain.  Review of Systems  Positive: Head injury, facial injury, Elbow pain, hand pain Negative: LOC, CP, back pain, hip pain  Physical Exam  BP 133/83 (BP Location: Left Arm)   Pulse 88   Temp 98.4 F (36.9 C)   Resp 17   Ht 5\' 1"  (1.549 m)   Wt 122.5 kg   SpO2 98%   BMI 51.02 kg/m  Gen:   Awake, no distress   Resp:  Normal effort  MSK:   Moves extremities without difficulty.  Other:    Medical Decision Making  Medically screening exam initiated at 7:24 PM.  Appropriate orders placed.  [name] was informed that the remainder of the evaluation will be completed by another provider, this initial triage assessment does not replace that evaluation, and the importance of remaining in the ED until their evaluation is complete.  CT scans of head, face, neck, xrays of elbow, hand   [name], PA-C 09/19/22 1924

## 2022-09-19 NOTE — ED Provider Notes (Signed)
[facility] Provider Note    Event Date/Time   First MD Initiated Contact with Patient 09/19/22 2131     (approximate)   History   Fall   HPI  Destiny Oliver is a 89 y.o. female past medical history significant for hypertension, hyperlipidemia, hypothyroidism, atrial fibrillation on Eliquis, who presents to the emergency department following a fall.  Patient was at her nursing facility and got tripped up when using her walker and fell forward.  Did hit her head and her arm.  Complaining of a headache, nose pain and right elbow pain.  Uncertain of last tetanus shot.  Denies any chest pain or shortness of breath.     Physical Exam   Triage Vital Signs: ED Triage Vitals  Enc Vitals Group     BP 09/19/22 1918 133/83     Pulse Rate 09/19/22 1918 88     Resp 09/19/22 1918 17     Temp 09/19/22 1918 98.4 F (36.9 C)     Temp src --      SpO2 09/19/22 1905 97 %     Weight 09/19/22 1922 270 lb (122.5 kg)     Height 09/19/22 1922 5\' 1"  (1.549 m)     Head Circumference --      Peak Flow --      Pain Score 09/19/22 1922 5     Pain Loc --      Pain Edu? --      Excl. in GC? --     Most recent vital signs: Vitals:   09/19/22 1918 09/19/22 2300  BP: 133/83   Pulse: 88 69  Resp: 17   Temp: 98.4 F (36.9 C)   SpO2: 98% 99%    Physical Exam Constitutional:      Appearance: She is well-developed.  HENT:     Head:     Comments: Large hematoma to the right forehead.  Abrasion over the nasal bridge with swelling, no septal deviation.  No septal hematoma. Eyes:     Conjunctiva/sclera: Conjunctivae normal.  Cardiovascular:     Rate and Rhythm: Regular rhythm.  Pulmonary:     Effort: No respiratory distress.  Abdominal:     General: There is no distension.  Musculoskeletal:        General: Normal range of motion.     Cervical back: Normal range of motion. No tenderness.     Comments: No tenderness to palpation to the thoracic or lumbar spine.  Full  range of motion to bilateral hips without pain.  No tenderness of bilateral lower extremities.  Tenderness to palpation that is point tenderness to the right elbow.  No open wounds or swelling.  Abrasion to the right ring finger.  Ring is in place but not tight.  Skin:    General: Skin is warm.  Neurological:     Mental Status: She is alert. Mental status is at baseline.      IMPRESSION / MDM / ASSESSMENT AND PLAN / ED COURSE  I reviewed the triage vital signs and the nursing notes.  Differential diagnosis including intracranial hemorrhage, concussion, facial fracture, elbow fracture, dislocation   RADIOLOGY I independently reviewed imaging, my interpretation of imaging: Elbow effusion, no obvious fracture my evaluation.  Read as positive for elbow effusion with possible nondisplaced radial head fracture.  CT scan of the head without signs of intracranial hemorrhage.  CT scan of the face read as acute mildly displaced fracture of the left nasal bone.  Labs (all labs ordered are listed, but only abnormal results are displayed) Labs interpreted as -    Labs Reviewed - No data to display    Tetanus was updated.  Given Tylenol for pain control.  Abrasion to the finger, attempted to remove the ring however unable to do so secondary to swelling and significant arthritis.  No fracture on hand x-ray.  The ring is very loose and do not feel like she is at risk of decreased circulation to the finger.  Good capillary refill.  Do not feel that the ring needs to be cut off at this time.  Instructed to elevate her arm and if swelling worsened that she would need to return to the emergency department to have her ring cut off at that point.  Placed in a posterior long-arm splint given concern for radial head fracture.  Patient has a follow-up appointment with her orthopedic surgeon scheduled for Friday already.  Found to have a nasal fracture.  No septal hematoma.  No signs of basilar skull  fracture.  No other acute traumatic injury.  Given information to follow-up with ENT.   PROCEDURES:  Critical Care performed: No  Procedures  Patient's presentation is most consistent with acute presentation with potential threat to life or bodily function.   MEDICATIONS ORDERED IN ED: Medications  Tdap (BOOSTRIX) injection 0.5 mL (0.5 mLs Intramuscular Given 09/19/22 2309)  acetaminophen (TYLENOL) tablet 1,000 mg (1,000 mg Oral Given 09/19/22 2310)    FINAL CLINICAL IMPRESSION(S) / ED DIAGNOSES   Final diagnoses:  Fall, initial encounter  Closed nondisplaced fracture of head of right radius, initial encounter  Abrasion of finger, initial encounter  Closed fracture of nasal bone, initial encounter     Rx / DC Orders   ED Discharge Orders     None        Note:  This document was prepared using Dragon voice recognition software and may include unintentional dictation errors.   Corena Herter, MD 09/19/22 [phone]

## 2022-09-19 NOTE — ED Triage Notes (Signed)
EMS brings pt in from [facility] for witnessed fall; bent over to pick something up and fell; lac to nose; currently taking eliquist: O2 at 2l/min via [employer] at baseline

## 2022-09-19 NOTE — Discharge Instructions (Signed)
You were seen in the emergency department following a fall.  You are found to have a fracture of your elbow and your nose.  You were given information to follow-up with the ear nose and throat specialist.  Apply ice to your nose.  No nose blowing.  You are placed in a splint.  You can follow-up with your orthopedic surgeon at your scheduled visit on Friday  Pain control: .  Acetaminophen (tylenol) - You can take 2 extra strength tablets (1000 mg) every 6 hours as needed for pain/fever.

## 2022-09-19 NOTE — ED Triage Notes (Addendum)
Pt states she was using her walker and bent down to pick something up off the floor, pt fell face first onto the floor. Pt states she takes eliquis. Pt has abrasion to nose and right ring finger. Pt c/o pain to right elbow, right ring finger head and nose. Pt on 2L 

## 2022-09-20 NOTE — ED Notes (Signed)
Abrasions to nose and right hand dressed with neosporin and gauze.  Splint applied to right arm with sling.  D/c inst to legal guardian with pt Destiny Oliver.  Pt alert.

## 2023-09-20 ENCOUNTER — Emergency Department

## 2023-09-20 ENCOUNTER — Emergency Department: Admission: EM | Admit: 2023-09-20 | Discharge: 2023-09-20 | Disposition: A | Discharge status: Home / Self Care

## 2023-09-20 DIAGNOSIS — I5020 Unspecified systolic (congestive) heart failure: Secondary | ICD-10-CM | POA: Diagnosis not present

## 2023-09-20 DIAGNOSIS — R0602 Shortness of breath: Secondary | ICD-10-CM

## 2023-09-20 DIAGNOSIS — J189 Pneumonia, unspecified organism: Secondary | ICD-10-CM

## 2023-09-20 DIAGNOSIS — N189 Chronic kidney disease, unspecified: Secondary | ICD-10-CM | POA: Diagnosis not present

## 2023-09-20 DIAGNOSIS — E1122 Type 2 diabetes mellitus with diabetic chronic kidney disease: Secondary | ICD-10-CM | POA: Diagnosis not present

## 2023-09-20 DIAGNOSIS — I130 Hypertensive heart and chronic kidney disease with heart failure and stage 1 through stage 4 chronic kidney disease, or unspecified chronic kidney disease: Secondary | ICD-10-CM | POA: Diagnosis not present

## 2023-09-20 DIAGNOSIS — R062 Wheezing: Secondary | ICD-10-CM

## 2023-09-20 DIAGNOSIS — I509 Heart failure, unspecified: Secondary | ICD-10-CM

## 2023-09-20 LAB — RESP PANEL BY RT-PCR (RSV, FLU A&B, COVID)  RVPGX2
Influenza A by PCR: NEGATIVE
Influenza B by PCR: NEGATIVE
Resp Syncytial Virus by PCR: NEGATIVE
SARS Coronavirus 2 by RT PCR: NEGATIVE

## 2023-09-20 LAB — CBC WITH DIFFERENTIAL/PLATELET
Abs Immature Granulocytes: 0.03 10*3/uL (ref 0.00–0.07)
Basophils Absolute: 0 10*3/uL (ref 0.0–0.1)
Basophils Relative: 0 %
Eosinophils Absolute: 0.1 10*3/uL (ref 0.0–0.5)
Eosinophils Relative: 1 %
HCT: 36.7 % (ref 36.0–46.0)
Hemoglobin: 11 g/dL — ABNORMAL LOW (ref 12.0–15.0)
Immature Granulocytes: 0 %
Lymphocytes Relative: 12 %
Lymphs Abs: 0.8 10*3/uL (ref 0.7–4.0)
MCH: 27.6 pg (ref 26.0–34.0)
MCHC: 30 g/dL (ref 30.0–36.0)
MCV: 92 fL (ref 80.0–100.0)
Monocytes Absolute: 0.5 10*3/uL (ref 0.1–1.0)
Monocytes Relative: 7 %
Neutro Abs: 5.6 10*3/uL (ref 1.7–7.7)
Neutrophils Relative %: 80 %
Platelets: 221 10*3/uL (ref 150–400)
RBC: 3.99 MIL/uL (ref 3.87–5.11)
RDW: 16.1 % — ABNORMAL HIGH (ref 11.5–15.5)
WBC: 7 10*3/uL (ref 4.0–10.5)
nRBC: 0 % (ref 0.0–0.2)

## 2023-09-20 LAB — BASIC METABOLIC PANEL WITH GFR
Anion gap: 10 (ref 5–15)
BUN: 39 mg/dL — ABNORMAL HIGH (ref 8–23)
CO2: 23 mmol/L (ref 22–32)
Calcium: 9.2 mg/dL (ref 8.9–10.3)
Chloride: 107 mmol/L (ref 98–111)
Creatinine, Ser: 1.76 mg/dL — ABNORMAL HIGH (ref 0.44–1.00)
GFR, Estimated: 26 mL/min — ABNORMAL LOW (ref >60)
Glucose, Bld: 111 mg/dL — ABNORMAL HIGH (ref 70–99)
Potassium: 3.5 mmol/L (ref 3.5–5.1)
Sodium: 140 mmol/L (ref 135–145)

## 2023-09-20 LAB — BRAIN NATRIURETIC PEPTIDE: B Natriuretic Peptide: 402.7 pg/mL — ABNORMAL HIGH (ref 0.0–100.0)

## 2023-09-20 LAB — TROPONIN I (HIGH SENSITIVITY)
Troponin I (High Sensitivity): 21 ng/L — ABNORMAL HIGH (ref <18)
Troponin I (High Sensitivity): 20 ng/L — ABNORMAL HIGH (ref <18)

## 2023-09-20 MED ORDER — LEVOFLOXACIN 750 MG PO TABS: 750.0000 mg | ORAL_TABLET | ORAL | 0 refills | Status: AC

## 2023-09-20 MED ORDER — ALBUTEROL SULFATE HFA 108 (90 BASE) MCG/ACT IN AERS: 2.0000 | INHALATION_SPRAY | Freq: Four times a day (QID) | RESPIRATORY_TRACT | 2 refills | Status: AC | PRN

## 2023-09-20 MED ORDER — LEVOFLOXACIN 500 MG PO TABS
750.0000 mg | ORAL_TABLET | Freq: Once | ORAL | Status: AC
  Administered 2023-09-20: 750 mg via ORAL
  Filled 2023-09-20: qty 2

## 2023-09-20 MED ORDER — FUROSEMIDE 10 MG/ML IJ SOLN
40.0000 mg | Freq: Once | INTRAMUSCULAR | Status: AC
  Administered 2023-09-20: 40 mg via INTRAVENOUS
  Filled 2023-09-20: qty 4

## 2023-09-20 MED ORDER — IPRATROPIUM-ALBUTEROL 0.5-2.5 (3) MG/3ML IN SOLN
3.0000 mL | Freq: Once | RESPIRATORY_TRACT | Status: AC
  Administered 2023-09-20: 3 mL via RESPIRATORY_TRACT
  Filled 2023-09-20: qty 3

## 2023-09-20 MED ORDER — ALBUTEROL SULFATE 1.25 MG/3ML IN NEBU: 1.0000 | INHALATION_SOLUTION | Freq: Four times a day (QID) | RESPIRATORY_TRACT | 12 refills | Status: AC | PRN

## 2023-09-20 NOTE — ED Provider Notes (Signed)
 [facility] Provider Note    Event Date/Time   First MD Initiated Contact with Patient 09/20/23 1153     (approximate)   History   Shortness of Breath  Pt presents to the ED via [facility] from [facility]. EMS was called out for increased SHOB. Pt's torsemide  was increased on 09/12/20. A&Ox4. Pt is orthopneic. Pt on 2L [place] at baseline   20g left AC  Systolic HF Stage four renal failure  12-lead with EMS shows afib with hx of same HR 98-110   HPI Destiny Oliver is a 89 y.o. female PMH CHF, pulmonary hypertension, chronic respiratory failure on 2 L nasal cannula, CKD, DM2, hypertension, hyperlipidemia, A-fib, anemia presents for evaluation of shortness of breath - Per patient and friend/POA at bedside, patient is been having increasing shortness of breath over the past few weeks.  Also noting increased bilateral lower extremity edema.  No recent fever or cough.  Is on 2 L nasal cannula at baseline.  Was evaluated by the physician assistant at her nursing facility today who sent patient to emergency department for eval.  Torsemide  was reportedly increased on 09/12/2020. - no chest pain      Physical Exam   BP (!) 121/57   Pulse 88   Temp 98.6 F (37 C)   Resp 19   Ht 5\' 1"  (1.549 m)   Wt 82.6 kg   SpO2 100%   BMI 34.39 kg/m     Most recent vital signs: Vitals:   09/20/23 1330 09/20/23 1430  BP: 126/64 (!) 121/57  Pulse: 84 88  Resp: (!) 21 19  Temp:    SpO2: 98% 100%     General: Awake, no distress.  CV:  Good peripheral perfusion.  Regular rate, irregular rhythm, RP 2+ Resp:  Mild tachypnea, speaking full sentences.  Somewhat coarse breath sounds bilateral lower lung fields.  Mild end expiratory wheezing bilateral upper lung fields. Abd:  No distention. Nontender to deep palpation throughout Other:  2+ BLE edema.    ED Results / Procedures / Treatments   Labs (all labs ordered are listed, but only abnormal results are  displayed) Labs Reviewed  CBC WITH DIFFERENTIAL/PLATELET - Abnormal; Notable for the following components:      Result Value   Hemoglobin 11.0 (*)    RDW 16.1 (*)    All other components within normal limits  BASIC METABOLIC PANEL WITH GFR - Abnormal; Notable for the following components:   Glucose, Bld 111 (*)    BUN 39 (*)    Creatinine, Ser 1.76 (*)    GFR, Estimated 26 (*)    All other components within normal limits  BRAIN NATRIURETIC PEPTIDE - Abnormal; Notable for the following components:   B Natriuretic Peptide 402.7 (*)    All other components within normal limits  TROPONIN I (HIGH SENSITIVITY) - Abnormal; Notable for the following components:   Troponin I (High Sensitivity) 21 (*)    All other components within normal limits  TROPONIN I (HIGH SENSITIVITY) - Abnormal; Notable for the following components:   Troponin I (High Sensitivity) 20 (*)    All other components within normal limits  RESP PANEL BY RT-PCR (RSV, FLU A&B, COVID)  RVPGX2     EKG  Ecg = afib, rate 104, no gross ST elevation or depression but interpretation somewhat limited by significant artifact from tachypnea   RADIOLOGY Radiology interpreted by myself and radiology report reviewed.    PROCEDURES:  Critical Care  performed: No  Procedures   MEDICATIONS ORDERED IN ED: Medications  levofloxacin  (LEVAQUIN ) tablet 750 mg (has no administration in time range)  ipratropium-albuterol  (DUONEB) 0.5-2.5 (3) MG/3ML nebulizer solution 3 mL (3 mLs Nebulization Given 09/20/23 1240)  furosemide  (LASIX ) injection 40 mg (40 mg Intravenous Given 09/20/23 1345)  ipratropium-albuterol  (DUONEB) 0.5-2.5 (3) MG/3ML nebulizer solution 3 mL (3 mLs Nebulization Given 09/20/23 1351)     IMPRESSION / MDM / ASSESSMENT AND PLAN / ED COURSE  I reviewed the triage vital signs and the nursing notes.                              DDX/MDM/AP: Differential diagnosis includes, but is not limited to, CHF exacerbation,  consider worsening renal failure, suspect some component of RAD contributing as well.  Fortunately currently on baseline oxygen satting well though sounds as if patient functionally has been worsening with regard to respiratory status.  Consider underlying pneumonia or viral syndrome including influenza or COVID-19.  Consider ACS  Plan: - Labs - EKG - Chest x-ray - DuoNeb - Anticipate need for diuresis - Reassess  Patient's presentation is most consistent with acute presentation with potential threat to life or bodily function.  The patient is on the cardiac monitor to evaluate for evidence of arrhythmia and/or significant heart rate changes.  ED course below.  Workup notable for mildly elevated troponin that is stable on repeat and downtrending from prior labs.  Viral swab negative.  Chest x-ray with possible small effusion and possible atelectasis versus aspiration versus pneumonia.  No leukocytosis or white count.  BNP at baseline.  Patient does have notable bilateral lower extremity edema here which is reportedly worse than usual, responded well to a single dose of IV Lasix , about 500 cc urine output here.  All wheezing resolved with 2 rounds of duo nebulizer treatment and patient is feeling much better.  Satting 100% on her usual 2 L nasal cannula.  Discussed chest x-ray findings, offered admission though patient declined and strongly prefers discharge back to her facility as this more aligns with her goals of care which I believe is very reasonable.  Allergies to cephalosporins and penicillins, started on levofloxacin , received first dose in the emergency department.  Plan for q. OD dosing based on creatinine clearance.  Also Rx albuterol , suspect RAD contributing to presentation.  Will go back to her facility where she also has nursing care.  ED return precautions in place.  Patient and H POA agree with plan.  Clinical Course as of 09/20/23 1514  Thu Sep 20, 2023  1246 BMP with AKI on  CKD   [MM]  1247 CBC without leukocytosis, anemia within baseline range [MM]  1313 Troponin mildly elevated, similar to prior [MM]  1316 BNP elevated similar to prior [MM]  1316 CMP with AKI on CKD [MM]  1445 Patient reevaluated, notes she is feeling much better.  Does still have some ongoing tachypnea though looks much better than my initial eval.  Satting 100% on her usual 2 L nasal cannula.  Repeat lung exam with good airflow throughout and no ongoing wheezing.  Has put out about 500 cc IV fluid with diuresis here.  Discussed possible hospitalization, patient is adamant that she would like to be discharged home.  POA bedside is in agreement with this.  Does have nursing care at her living facility and does not have an increased oxygen requirement here.  I believe this is  a very reasonable plan.  Will continue with diuresis, start breathing treatments as I believe obstructive lung pathology is likely etiology of presentation. [MM]  1447 Rpt trop stable [MM]  1457 X-ray read as having possible aspiration, atelectasis, or pneumonia.  Discussed with patient and H POA, would still like to proceed with discharge home this would be most in accordance with her goals of care.  Will start empiric antibiotics.  Does have allergies to penicillins as well as cephalosporins listed in her chart.  Will treat with levofloxacin .  CrCl 25 on my calculation, will plan for 750 mg q. OD. [MM]    Clinical Course User Index [MM] Collis Deaner, MD     FINAL CLINICAL IMPRESSION(S) / ED DIAGNOSES   Final diagnoses:  Shortness of breath  Wheezing  Chronic congestive heart failure, unspecified heart failure type (HCC)  Pneumonia due to infectious organism, unspecified laterality, unspecified part of lung     Rx / DC Orders   ED Discharge Orders          Ordered    levofloxacin  (LEVAQUIN ) 750 MG tablet  Every 48 hours        09/20/23 1502    albuterol  (VENTOLIN  HFA) 108 (90 Base) MCG/ACT inhaler  Every 6  hours PRN        09/20/23 1508    albuterol  (ACCUNEB ) 1.25 MG/3ML nebulizer solution  Every 6 hours PRN        09/20/23 1508             Note:  This document was prepared using Dragon voice recognition software and may include unintentional dictation errors.   Collis Deaner, MD 09/20/23 [phone]

## 2023-09-20 NOTE — ED Triage Notes (Signed)
 Pt presents to the ED via [facility] from [facility]. EMS was called out for increased SHOB. Pt's torsemide  was increased on 09/12/20. A&Ox4. Pt is orthopneic. Pt on 2L [place] at baseline   20g left AC  Systolic HF Stage four renal failure  12-lead with EMS shows afib with hx of same HR 98-110

## 2023-09-20 NOTE — ED Notes (Signed)
 Pt verbalizes understanding of discharge instructions. Opportunity for questioning and answers were provided. Pt discharged from ED to home with friend.

## 2023-09-20 NOTE — ED Notes (Signed)
 Family at bedside.

## 2023-09-20 NOTE — ED Notes (Signed)
 Fall bracelet and bed alarm on

## 2023-09-20 NOTE — Discharge Instructions (Signed)
 Your evaluation in the emergency department was notable for wheezing, possible mild fluid overload, and possible mild early pneumonia.  You felt better after treatment.  I prescribed you breathing treatments to use at home as well as an antibiotic to treat this possible pneumonia.  Please do follow-up with your primary provider for reevaluation, and return to the emergency department with any new or worsening symptoms.

## 2024-03-06 ENCOUNTER — Emergency Department

## 2024-03-06 ENCOUNTER — Emergency Department
Admission: EM | Admit: 2024-03-06 | Discharge: 2024-03-06 | Disposition: A | Discharge status: Home / Self Care | Attending: Emergency Medicine | Admitting: Emergency Medicine

## 2024-03-06 ENCOUNTER — Other Ambulatory Visit: Payer: Self-pay

## 2024-03-06 DIAGNOSIS — R0602 Shortness of breath: Secondary | ICD-10-CM | POA: Diagnosis present

## 2024-03-06 DIAGNOSIS — I482 Chronic atrial fibrillation, unspecified: Secondary | ICD-10-CM | POA: Insufficient documentation

## 2024-03-06 DIAGNOSIS — I4891 Unspecified atrial fibrillation: Secondary | ICD-10-CM | POA: Diagnosis present

## 2024-03-06 DIAGNOSIS — I509 Heart failure, unspecified: Secondary | ICD-10-CM | POA: Diagnosis not present

## 2024-03-06 DIAGNOSIS — R5383 Other fatigue: Secondary | ICD-10-CM

## 2024-03-06 DIAGNOSIS — I130 Hypertensive heart and chronic kidney disease with heart failure and stage 1 through stage 4 chronic kidney disease, or unspecified chronic kidney disease: Secondary | ICD-10-CM | POA: Insufficient documentation

## 2024-03-06 DIAGNOSIS — N189 Chronic kidney disease, unspecified: Secondary | ICD-10-CM | POA: Insufficient documentation

## 2024-03-06 DIAGNOSIS — E1122 Type 2 diabetes mellitus with diabetic chronic kidney disease: Secondary | ICD-10-CM | POA: Diagnosis not present

## 2024-03-06 DIAGNOSIS — Z853 Personal history of malignant neoplasm of breast: Secondary | ICD-10-CM | POA: Diagnosis not present

## 2024-03-06 LAB — CBC WITH DIFFERENTIAL/PLATELET
Abs Immature Granulocytes: 0.06 K/uL (ref 0.00–0.07)
Basophils Absolute: 0 K/uL (ref 0.0–0.1)
Basophils Relative: 0 %
Eosinophils Absolute: 0.2 K/uL (ref 0.0–0.5)
Eosinophils Relative: 3 %
HCT: 35.4 % — ABNORMAL LOW (ref 36.0–46.0)
Hemoglobin: 11.1 g/dL — ABNORMAL LOW (ref 12.0–15.0)
Immature Granulocytes: 1 %
Lymphocytes Relative: 10 %
Lymphs Abs: 0.9 K/uL (ref 0.7–4.0)
MCH: 28 pg (ref 26.0–34.0)
MCHC: 31.4 g/dL (ref 30.0–36.0)
MCV: 89.4 fL (ref 80.0–100.0)
Monocytes Absolute: 0.7 K/uL (ref 0.1–1.0)
Monocytes Relative: 9 %
Neutro Abs: 6.8 K/uL (ref 1.7–7.7)
Neutrophils Relative %: 77 %
Platelets: 323 K/uL (ref 150–400)
RBC: 3.96 MIL/uL (ref 3.87–5.11)
RDW: 14.6 % (ref 11.5–15.5)
WBC: 8.7 K/uL (ref 4.0–10.5)
nRBC: 0 % (ref 0.0–0.2)

## 2024-03-06 LAB — COMPREHENSIVE METABOLIC PANEL WITH GFR
ALT: 23 U/L (ref 0–44)
AST: 20 U/L (ref 15–41)
Albumin: 2.9 g/dL — ABNORMAL LOW (ref 3.5–5.0)
Alkaline Phosphatase: 101 U/L (ref 38–126)
Anion gap: 14 (ref 5–15)
BUN: 44 mg/dL — ABNORMAL HIGH (ref 8–23)
CO2: 18 mmol/L — ABNORMAL LOW (ref 22–32)
Calcium: 8.7 mg/dL — ABNORMAL LOW (ref 8.9–10.3)
Chloride: 113 mmol/L — ABNORMAL HIGH (ref 98–111)
Creatinine, Ser: 1.7 mg/dL — ABNORMAL HIGH (ref 0.44–1.00)
GFR, Estimated: 27 mL/min — ABNORMAL LOW (ref >60)
Glucose, Bld: 105 mg/dL — ABNORMAL HIGH (ref 70–99)
Potassium: 3.5 mmol/L (ref 3.5–5.1)
Sodium: 145 mmol/L (ref 135–145)
Total Bilirubin: 0.7 mg/dL (ref 0.0–1.2)
Total Protein: 6.2 g/dL — ABNORMAL LOW (ref 6.5–8.1)

## 2024-03-06 LAB — RESP PANEL BY RT-PCR (RSV, FLU A&B, COVID)  RVPGX2
Influenza A by PCR: NEGATIVE
Influenza B by PCR: NEGATIVE
Resp Syncytial Virus by PCR: NEGATIVE
SARS Coronavirus 2 by RT PCR: NEGATIVE

## 2024-03-06 LAB — URINALYSIS, W/ REFLEX TO CULTURE (INFECTION SUSPECTED)
Bilirubin Urine: NEGATIVE
Glucose, UA: NEGATIVE mg/dL
Hgb urine dipstick: NEGATIVE
Ketones, ur: NEGATIVE mg/dL
Leukocytes,Ua: NEGATIVE
Nitrite: NEGATIVE
Protein, ur: NEGATIVE mg/dL
Specific Gravity, Urine: 1.006 (ref 1.005–1.030)
pH: 5 (ref 5.0–8.0)

## 2024-03-06 LAB — BLOOD GAS, VENOUS
Acid-base deficit: 7.1 mmol/L — ABNORMAL HIGH (ref 0.0–2.0)
Bicarbonate: 16.6 mmol/L — ABNORMAL LOW (ref 20.0–28.0)
O2 Saturation: 80 %
Patient temperature: 37
pCO2, Ven: 28 mmHg — ABNORMAL LOW (ref 44–60)
pH, Ven: 7.38 (ref 7.25–7.43)
pO2, Ven: 48 mmHg — ABNORMAL HIGH (ref 32–45)

## 2024-03-06 LAB — PROTIME-INR
INR: 1.2 (ref 0.8–1.2)
Prothrombin Time: 16.3 s — ABNORMAL HIGH (ref 11.4–15.2)

## 2024-03-06 LAB — LACTIC ACID, PLASMA: Lactic Acid, Venous: 1.2 mmol/L (ref 0.5–1.9)

## 2024-03-06 MED ORDER — SODIUM CHLORIDE 0.9 % IV BOLUS
500.0000 mL | Freq: Once | INTRAVENOUS | Status: AC
  Administered 2024-03-06: 500 mL via INTRAVENOUS

## 2024-03-06 NOTE — Discharge Instructions (Signed)
 Chest x-ray and lab test were all okay today.  Continue taking your usual medications and follow-up with your doctor for further evaluation.

## 2024-03-06 NOTE — ED Notes (Signed)
 Pt called out to use bathroom. This tech assisted pt to bathroom. Pt needed maximum assistance to bathroom. This tech got a wheelchair to get pt back to bed after urinating at this time. Pt comfortable in bed at this time.

## 2024-03-06 NOTE — ED Provider Notes (Addendum)
 [facility] Provider Note    Event Date/Time   First MD Initiated Contact with Patient 03/06/24 1123     (approximate)   History   Chief Complaint: Atrial Fibrillation   HPI  Destiny Oliver is a 89 y.o. female with a history of chronic atrial fibrillation, CHF CKD diabetes hypertension who was sent to the ED from skilled nursing facility due to increased A-fib heart rate and fatigue today.  Wears 2 L nasal cannula at baseline.  Denies pain.        Past Medical History:  Diagnosis Date   Arrhythmia    atrial fibrillation   Breast cancer (HCC) 1994   right breast   CHF (congestive heart failure) (HCC)    CKD (chronic kidney disease)    Diabetes mellitus without complication (HCC)    Hyperlipidemia    Hypertension    Pulmonary HTN (HCC)    Tachycardia     Current Outpatient Rx   Order #: [id number] Class: Historical Med   Order #: [id number] Class: Normal   Order #: [id number] Class: Historical Med   Order #: [id number] Class: No Print   Order #: [id number] Class: Historical Med   Order #: [id number] Class: Historical Med   Order #: [id number] Class: Historical Med   Order #: [id number] Class: Historical Med   Order #: [id number] Class: Historical Med   Order #: [id number] Class: Historical Med   Order #: [id number] Class: Historical Med   Order #: [id number] Class: No Print   Order #: [id number] Class: Historical Med   Order #: [id number] Class: Normal   Order #: [id number] Class: Historical Med   Order #: [id number] Class: OTC   Order #: [id number] Class: Normal   Order #: [id number] Class: Historical Med   Order #: [id number] Class: Historical Med   Order #: [id number] Class: Historical Med   Order #: [id number] Class: Print   Order #: [id number] Class: No Print    Past Surgical History:  Procedure Laterality Date   INTRAMEDULLARY (IM) NAIL INTERTROCHANTERIC Left 03/09/2022   Procedure: INTRAMEDULLARY (IM) NAIL INTERTROCHANTERIC;  Surgeon: Lorelle Hussar, MD;   Location: [facility] ORS;  Service: Orthopedics;  Laterality: Left;   MASTECTOMY Right 1994    Physical Exam   Triage Vital Signs: ED Triage Vitals  Encounter Vitals Group     BP 03/06/24 1129 126/77     Girls Systolic BP Percentile --      Girls Diastolic BP Percentile --      Boys Systolic BP Percentile --      Boys Diastolic BP Percentile --      Pulse Rate 03/06/24 1129 (!) 101     Resp 03/06/24 1129 (!) 26     Temp --      Temp src --      SpO2 03/06/24 1128 94 %     Weight 03/06/24 1130 170 lb (77.1 kg)     Height 03/06/24 1130 5' 1 (1.549 m)     Head Circumference --      Peak Flow --      Pain Score 03/06/24 1130 0     Pain Loc --      Pain Education --      Exclude from Growth Chart --     Most recent vital signs: Vitals:   03/06/24 1434 03/06/24 1437  BP: 105/64   Pulse: 86   Resp: (!) 29 19  Temp:    SpO2: 97%     General: Awake, no distress.  CV:  Good peripheral perfusion.  Irregular rhythm, heart rate 100 Resp:  Mild  tachypnea..  Clear lungs Abd:  No distention.  Soft nontender Other:  No lower extremity edema.  Dry oral mucosa.   ED Results / Procedures / Treatments   Labs (all labs ordered are listed, but only abnormal results are displayed) Labs Reviewed  CBC WITH DIFFERENTIAL/PLATELET - Abnormal; Notable for the following components:      Result Value   Hemoglobin 11.1 (*)    HCT 35.4 (*)    All other components within normal limits  PROTIME-INR - Abnormal; Notable for the following components:   Prothrombin Time 16.3 (*)    All other components within normal limits  URINALYSIS, W/ REFLEX TO CULTURE (INFECTION SUSPECTED) - Abnormal; Notable for the following components:   Color, Urine STRAW (*)    APPearance CLEAR (*)    Bacteria, UA MANY (*)    All other components within normal limits  BLOOD GAS, VENOUS - Abnormal; Notable for the following components:   pCO2, Ven 28 (*)    pO2, Ven 48 (*)    Bicarbonate 16.6 (*)    Acid-base deficit  7.1 (*)    All other components within normal limits  COMPREHENSIVE METABOLIC PANEL WITH GFR - Abnormal; Notable for the following components:   Chloride 113 (*)    CO2 18 (*)    Glucose, Bld 105 (*)    BUN 44 (*)    Creatinine, Ser 1.70 (*)    Calcium 8.7 (*)    Total Protein 6.2 (*)    Albumin 2.9 (*)    GFR, Estimated 27 (*)    All other components within normal limits  RESP PANEL BY RT-PCR (RSV, FLU A&B, COVID)  RVPGX2  CULTURE, BLOOD (ROUTINE X 2)  CULTURE, BLOOD (ROUTINE X 2)  LACTIC ACID, PLASMA     EKG Interpreted by me Atrial fibrillation rate of 98.  Left axis, normal intervals.  No acute ischemic changes.   RADIOLOGY Interpreted by me Hazy opacities in the left lower lung, appears similar from previous chest x-ray Sep 20, 2023.   PROCEDURES:  Procedures   MEDICATIONS ORDERED IN ED: Medications  sodium chloride  0.9 % bolus 500 mL (500 mLs Intravenous New Bag/Given 03/06/24 1307)     IMPRESSION / MDM / ASSESSMENT AND PLAN / ED COURSE  I reviewed the triage vital signs and the nursing notes.  DDx: Pneumonia, UTI, electrolyte derangement, AKI, anemia, sepsis  Patient's presentation is most consistent with acute presentation with potential threat to life or bodily function.  Patient sent to the ED with tachypnea tachycardia, low-grade fever, concerning for sepsis.  Will start infectious workup.  Appears dehydrated will give IV fluids.    ----------------------------------------- 3:06 PM on 03/06/2024 ----------------------------------------- Heart rate improved with IV fluids.  Patient is awake and alert and reports feeling well.  Workup is reassuring.  Not septic.  Doubt ACS, PE, dissection, pericardial effusion.  Stable for discharge.  Friend at bedside will help schedule follow-up with her cardiologist and primary care doctor.     FINAL CLINICAL IMPRESSION(S) / ED DIAGNOSES   Final diagnoses:  Fatigue, unspecified type  Chronic atrial  fibrillation (HCC)     Rx / DC Orders   ED Discharge Orders     None        Note:  This document was prepared using Dragon voice recognition software and may include unintentional dictation errors.   Viviann Pastor, MD 03/06/24 1506    Viviann Pastor, MD 03/06/24 [phone]

## 2024-03-06 NOTE — ED Triage Notes (Signed)
 Pt coming by EMS from [facility] with complaints of Afib with a higher rate than normal, increased SOB and weakness when ambulating. Pt has a hx of CHF. Wears O2 at baseline.   Temp: 99.5 HR: 88-120 BP: 134/70 CBG: 124 O2: 95% on 3 L [place]: 21

## 2024-03-10 ENCOUNTER — Emergency Department
Admission: EM | Admit: 2024-03-10 | Discharge: 2024-03-10 | Disposition: A | Discharge status: Skilled Nursing Facility | Attending: Emergency Medicine | Admitting: Emergency Medicine

## 2024-03-10 ENCOUNTER — Emergency Department

## 2024-03-10 ENCOUNTER — Other Ambulatory Visit: Payer: Self-pay

## 2024-03-10 ENCOUNTER — Encounter: Payer: Self-pay | Admitting: Emergency Medicine

## 2024-03-10 DIAGNOSIS — W1839XA Other fall on same level, initial encounter: Secondary | ICD-10-CM | POA: Insufficient documentation

## 2024-03-10 DIAGNOSIS — W19XXXA Unspecified fall, initial encounter: Secondary | ICD-10-CM

## 2024-03-10 DIAGNOSIS — I130 Hypertensive heart and chronic kidney disease with heart failure and stage 1 through stage 4 chronic kidney disease, or unspecified chronic kidney disease: Secondary | ICD-10-CM | POA: Diagnosis not present

## 2024-03-10 DIAGNOSIS — S0990XA Unspecified injury of head, initial encounter: Secondary | ICD-10-CM | POA: Diagnosis present

## 2024-03-10 DIAGNOSIS — Z79899 Other long term (current) drug therapy: Secondary | ICD-10-CM | POA: Insufficient documentation

## 2024-03-10 DIAGNOSIS — N189 Chronic kidney disease, unspecified: Secondary | ICD-10-CM | POA: Diagnosis not present

## 2024-03-10 DIAGNOSIS — E1122 Type 2 diabetes mellitus with diabetic chronic kidney disease: Secondary | ICD-10-CM | POA: Insufficient documentation

## 2024-03-10 DIAGNOSIS — I482 Chronic atrial fibrillation, unspecified: Secondary | ICD-10-CM | POA: Diagnosis not present

## 2024-03-10 DIAGNOSIS — Z7901 Long term (current) use of anticoagulants: Secondary | ICD-10-CM | POA: Diagnosis not present

## 2024-03-10 DIAGNOSIS — I509 Heart failure, unspecified: Secondary | ICD-10-CM | POA: Insufficient documentation

## 2024-03-10 DIAGNOSIS — Z853 Personal history of malignant neoplasm of breast: Secondary | ICD-10-CM | POA: Diagnosis not present

## 2024-03-10 NOTE — ED Notes (Signed)
 This tech assisted pt ambulating in hallway with walker. Pt steady with heart rate increasing to 142 bpm and respirations increasing to 35. MD and RN notified. Pt back in bed and comfortable at this time.

## 2024-03-10 NOTE — ED Provider Notes (Signed)
 [facility] Provider Note    Event Date/Time   First MD Initiated Contact with Patient 03/10/24 [phone]     (approximate)   History   Fall   HPI  Destiny Oliver is a 89 y.o. female with history of chronic kidney disease, hypertension, diabetes, hyperlipidemia, CHF on chronic 3 L of oxygen, atrial fibrillation on Eliquis  who presents to the emergency department after she slipped and fell in her bathroom at [facility] and struck her head.  There was no loss of consciousness.  She denies any pain at this time.  She was able to get up and has been able to stand.  No chest pain, shortness of breath, fevers, cough, vomiting, diarrhea, urinary symptoms.  Patient is a DNR.   History provided by patient and EMS.    Past Medical History:  Diagnosis Date   Arrhythmia    atrial fibrillation   Breast cancer (HCC) 1994   right breast   CHF (congestive heart failure) (HCC)    CKD (chronic kidney disease)    Diabetes mellitus without complication (HCC)    Hyperlipidemia    Hypertension    Pulmonary HTN (HCC)    Tachycardia     Past Surgical History:  Procedure Laterality Date   INTRAMEDULLARY (IM) NAIL INTERTROCHANTERIC Left 03/09/2022   Procedure: INTRAMEDULLARY (IM) NAIL INTERTROCHANTERIC;  Surgeon: Lorelle Hussar, MD;  Location: [facility] ORS;  Service: Orthopedics;  Laterality: Left;   MASTECTOMY Right 1994    MEDICATIONS:  Prior to Admission medications   Medication Sig Start Date End Date Taking? Authorizing Provider  acetaminophen  (TYLENOL ) 325 MG tablet Take 1-2 tablets (325-650 mg total) by mouth every 6 (six) hours as needed for mild pain (pain score 1-3 or temp > 100.5). Patient not taking: Reported on 03/06/2024 03/10/22   Charlene Debby BROCKS, PA-C  Acetaminophen  Extra Strength 500 MG TABS Take 2 tablets by mouth at bedtime. 02/18/24   [provider]  albuterol  (ACCUNEB ) 1.25 MG/3ML nebulizer solution Take 3 mLs (1.25 mg total) by nebulization  every 6 (six) hours as needed for wheezing. 09/20/23   Clarine Ozell LABOR, MD  albuterol  (VENTOLIN  HFA) 108 (90 Base) MCG/ACT inhaler Inhale 2 puffs into the lungs every 6 (six) hours as needed for wheezing or shortness of breath.    [provider]  albuterol  (VENTOLIN  HFA) 108 (90 Base) MCG/ACT inhaler Inhale 2 puffs into the lungs every 6 (six) hours as needed for wheezing or shortness of breath. 09/20/23   Clarine Ozell LABOR, MD  apixaban  (ELIQUIS ) 2.5 MG TABS tablet Take 1 tablet (2.5 mg total) by mouth 2 (two) times daily. 04/30/18   Laurence Bridegroom, MD  Carboxymethylcellulose Sodium (THERATEARS) 0.25 % SOLN Place 1 drop into both eyes every 2 (two) hours while awake.    [provider]  Cholecalciferol  (VITAMIN D ) 50 MCG (2000 UT) tablet Take 2,000 Units by mouth daily. Patient not taking: Reported on 03/06/2024    [provider]  Emollient (CERAVE) CREA Apply 1 Application topically as directed. (Apply after washing)    [provider]  febuxostat (ULORIC) 40 MG tablet Take 40 mg by mouth daily.    [provider]  ferrous sulfate  325 (65 FE) MG tablet Take 325 mg by mouth in the morning. Patient not taking: Reported on 03/06/2024    [provider]  hydrALAZINE  (APRESOLINE ) 50 MG tablet Take 50 mg by mouth 4 (four) times daily. 0700/1100/1500/1900    [provider]  levothyroxine  (  SYNTHROID ) 75 MCG tablet Take 75 mcg by mouth daily. 10/02/19   [provider]  metoprolol  succinate (TOPROL -XL) 50 MG 24 hr tablet Take 50 mg by mouth in the morning.    [provider]  Multiple Vitamins-Minerals (HEALTHY EYES SUPERVISION 2) CAPS Take 1 capsule by mouth 2 (two) times daily.    [provider]  neomycin -polymyxin b-dexamethasone  (MAXITROL ) 3.5-10000-0.1 OINT Place 1 Application into both eyes at bedtime. Patient not taking: Reported on 03/06/2024    [provider]  polyethylene glycol (MIRALAX  / GLYCOLAX ) 17 g  packet Take 17 g by mouth daily. 03/14/22   Lenon Marien CROME, MD  potassium chloride  SA (KLOR-CON  M) 20 MEQ tablet Take 1 tablet (20 mEq total) by mouth daily for 5 days. Patient taking differently: Take 40 mEq by mouth 2 (two) times daily. 0700/1900 04/12/22 04/17/22  Lang Dover, MD  senna (SENOKOT) 8.6 MG TABS tablet Take 2 tablets by mouth in the morning and at bedtime.    [provider]  sodium phosphate (FLEET) 7-19 GM/118ML ENEM Place 133 mLs (1 enema total) rectally daily as needed for severe constipation. 03/14/22   Lenon Marien CROME, MD  Torsemide  40 MG TABS Take 40 mg by mouth daily. Patient taking differently: Take 20-40 mg by mouth in the morning and at bedtime. 40 mg qam and 20 mg qpm 04/17/22   Laurita Pillion, MD  White Petrolatum-Mineral Oil (ARTIFICIAL EYE) 83-15 % OINT Place 1 Application into both eyes at bedtime. Apply a thin ribbon into both eyes at bedtime    [provider]    Physical Exam   Triage Vital Signs: ED Triage Vitals  Encounter Vitals Group     BP      Girls Systolic BP Percentile      Girls Diastolic BP Percentile      Boys Systolic BP Percentile      Boys Diastolic BP Percentile      Pulse      Resp      Temp      Temp src      SpO2      Weight      Height      Head Circumference      Peak Flow      Pain Score      Pain Loc      Pain Education      Exclude from Growth Chart     Most recent vital signs: Vitals:   03/10/24 0623  BP: 122/70  Pulse: (!) 105  Resp: 18  Temp: 97.8 F (36.6 C)  SpO2: 97%     CONSTITUTIONAL: Alert, responds appropriately to questions. Well-appearing; well-nourished; GCS 15, elderly, pleasant, oriented x 4 HEAD: Normocephalic; small hematoma to the back of the scalp EYES: Conjunctivae clear, PERRL, EOMI ENT: normal nose; no rhinorrhea; moist mucous membranes; pharynx without lesions noted; no dental injury; no septal hematoma, no epistaxis; no facial deformity or bony  tenderness NECK: Supple, no midline spinal tenderness, step-off or deformity; trachea midline CARD: RRR; S1 and S2 appreciated; no murmurs, no clicks, no rubs, no gallops RESP: Normal chest excursion without splinting or tachypnea; breath sounds clear and equal bilaterally; no wheezes, no rhonchi, no rales; no hypoxia or respiratory distress CHEST:  chest wall stable, no crepitus or ecchymosis or deformity, nontender to palpation; no flail chest ABD/GI: Non-distended; soft, non-tender, no rebound, no guarding; no ecchymosis or other lesions noted PELVIS:  stable, nontender to palpation BACK:  The  back appears normal; no midline spinal tenderness, step-off or deformity EXT: Normal ROM in all joints; no edema; normal capillary refill; no cyanosis, no bony tenderness or bony deformity of patient's extremities, no joint effusions, compartments are soft, extremities are warm and well-perfused, no ecchymosis, patient's left leg is slightly shorter than the right which she states is chronic, she is able to lift both legs off the bed without difficulty or pain, 2+ DP pulses bilaterally SKIN: Normal color for age and race; warm NEURO: No facial asymmetry, normal speech, moving all extremities equally  ED Results / Procedures / Treatments   LABS: (all labs ordered are listed, but only abnormal results are displayed) Labs Reviewed - No data to display   EKG:    RADIOLOGY: My personal review and interpretation of imaging: CTs pending.  I have personally reviewed all radiology reports. No results found.   PROCEDURES:  Critical Care performed: No   Procedures    IMPRESSION / MDM / ASSESSMENT AND PLAN / ED COURSE  I reviewed the triage vital signs and the nursing notes.  Patient here with mechanical fall with head injury on Eliquis .    DIFFERENTIAL DIAGNOSIS (includes but not limited to):   Hematoma, skull fracture, intracranial hemorrhage, cervical spine fracture,  concussion  Patient's presentation is most consistent with acute presentation with potential threat to life or bodily function.  PLAN: Will obtain CT head and cervical spine.  No other sign of traumatic injury on exam.  No proceeding symptoms that led to her fall.   MEDICATIONS GIVEN IN ED: Medications - No data to display   ED COURSE: CTs pending.  Patient signed out the oncoming ED physician at 7:10 AM.   CONSULTS: Dispo pending further workup.   OUTSIDE RECORDS REVIEWED: Reviewed last nephrology note.       FINAL CLINICAL IMPRESSION(S) / ED DIAGNOSES   Final diagnoses:  Fall, initial encounter  Injury of head, initial encounter     Rx / DC Orders   ED Discharge Orders     None        Note:  This document was prepared using Dragon voice recognition software and may include unintentional dictation errors.   Regenia Erck, Josette SAILOR, DO 03/10/24 [phone]

## 2024-03-10 NOTE — ED Triage Notes (Signed)
 Pt to ED via [facility] from [facility], per EMS pt had an unwitnessed fall prior to arrival. Per EMS pt is on eliquis .Pt with hematoma to posterior head.  EMS reports chronic 2L use, and has bilateral hearing aids upon arrival.      93% 2L 98HR 119/86

## 2024-03-10 NOTE — ED Notes (Signed)
 This tech ambulated with pt and heart rate got up to the 190s pt ws noticeable out of breath on 2L of O2 RN Heidy notified

## 2024-03-10 NOTE — ED Notes (Addendum)
 This RN attempted to call [facility] x3 to give report. No answer and no voicemail set up to leave a message. POA [place] to take pt back to facility.

## 2024-03-10 NOTE — ED Provider Notes (Addendum)
 7:47 AM Assumed care for off going team.   Blood pressure 122/70, pulse (!) 105, temperature 97.8 F (36.6 C), resp. rate 18, height 5' 1 (1.549 m), weight 77.1 kg, SpO2 97%.  See their HPI for full report but in brief pending CT     IMPRESSION: 1. No acute traumatic injury identified in the cervical spine. 2. Stable advanced chronic cervical spine degeneration. 3. Chronic pulmonary interstitial disease.  IMPRESSION: 1. No acute traumatic injury identified. 2. 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  8:41 AM reevaluated patient she denies any pain abdomen soft and nontender able to lift both legs up off the bed.  Will do ambulatory trial but they feel comfortable with discharge home back to facility.  Legal guardian is at bedside he is agreeable to plan.  She is to go pick up her home portable oxygen tank given she is on baseline 2 L  There is report that when patient ambulated that her heart rates were elevated.  EKG is atrial fibrillation with a rate of 88 without any ST elevation or T wave inversions, normal intervals.   I reviewed the records and patient has a history of A-fib.  I wonder if this was just an inaccurate reading given her A-fib but while since she was only on the pulse ox when they recorded these elevated heart rates.  Will try to stand up and ambulate her now that she is on the cardiac monitor to see if her heart rates are actually elevated given they appear to be rate controlled at this time.  11:30 AM discussed with patient's legal guardian, POA that when she does ambulate her heart rate went up to 140.  Report that she was just seen here in the hospital with thorough workup that was reassuring.  She stated that they did not want to stay any longer as she needed to take her home because she has to take her for other family member to a doctor's appointment.  They did report  that she has not eaten any breakfast and she is not taking her home metoprolol  and I suspect that is why her heart rates are going up with ambulation.  The point of the ambulation was just to make sure we were not missing any type of occult fracture and she was not having any pain.  Patient denies any chest pain, shortness of breath and she would prefer to go home as well.  Given patient's age and I suspect this is related to her missed dose of metoprolol  they have opted to want to be discharged without further workup.    Ernest Ronal BRAVO, MD 03/10/24 [id number]    Ernest Ronal BRAVO, MD 03/10/24 [phone]

## 2024-03-10 NOTE — Discharge Instructions (Addendum)
 Her heart rates were going up with ambulation which I suspect is secondary to missing breakfast and missing her dose of metoprolol .  They did not want to stay any longer for any additional blood work or given the metoprolol  and see what her heart rates did at rest her heart rates were in the 80s therefore patient was discharged.  But please make sure she gets her metoprolol  this morning when she returns and drinks lots of fluids and has some food.  Return to the ER for chest pain, shortness of breath weakness or any other concerns   IMPRESSION: 1. No acute traumatic injury identified in the cervical spine. 2. Stable advanced chronic cervical spine degeneration. 3. Chronic pulmonary interstitial disease.  IMPRESSION: 1. No acute traumatic injury identified. 2. 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

## 2024-03-11 LAB — CULTURE, BLOOD (ROUTINE X 2)
Culture: NO GROWTH
Culture: NO GROWTH
